# Patient Record
Sex: Male | Born: 1965 | Race: Asian | Hispanic: No | Marital: Single | Smoking: Light tobacco smoker
Health system: Southern US, Community
[De-identification: ages and names within clinical notes are randomized; demographics above are authoritative.]

## PROBLEM LIST (undated history)

## (undated) DIAGNOSIS — D649 Anemia, unspecified: Secondary | ICD-10-CM

## (undated) DIAGNOSIS — K6389 Other specified diseases of intestine: Secondary | ICD-10-CM

## (undated) DIAGNOSIS — F329 Major depressive disorder, single episode, unspecified: Secondary | ICD-10-CM

## (undated) DIAGNOSIS — F32A Depression, unspecified: Secondary | ICD-10-CM

---

## 2018-06-24 DIAGNOSIS — E8809 Other disorders of plasma-protein metabolism, not elsewhere classified: Secondary | ICD-10-CM

## 2018-06-24 DIAGNOSIS — D649 Anemia, unspecified: Secondary | ICD-10-CM

## 2018-06-24 DIAGNOSIS — R64 Cachexia: Secondary | ICD-10-CM

## 2018-06-25 DIAGNOSIS — D509 Iron deficiency anemia, unspecified: Secondary | ICD-10-CM

## 2018-07-06 ENCOUNTER — Other Ambulatory Visit: Payer: Self-pay

## 2018-07-06 ENCOUNTER — Inpatient Hospital Stay (HOSPITAL_COMMUNITY)
Admission: EM | Admit: 2018-07-06 | Discharge: 2018-07-08 | DRG: 374 | Disposition: A | Discharge status: Home / Self Care | Payer: Medicaid Other | Source: Other Acute Inpatient Hospital | Attending: Internal Medicine | Admitting: Internal Medicine

## 2018-07-06 ENCOUNTER — Encounter (HOSPITAL_COMMUNITY): Payer: Self-pay | Admitting: Internal Medicine

## 2018-07-06 ENCOUNTER — Inpatient Hospital Stay (HOSPITAL_COMMUNITY): Payer: Medicaid Other

## 2018-07-06 DIAGNOSIS — K631 Perforation of intestine (nontraumatic): Secondary | ICD-10-CM | POA: Diagnosis present

## 2018-07-06 DIAGNOSIS — D500 Iron deficiency anemia secondary to blood loss (chronic): Secondary | ICD-10-CM | POA: Diagnosis not present

## 2018-07-06 DIAGNOSIS — E43 Unspecified severe protein-calorie malnutrition: Secondary | ICD-10-CM | POA: Diagnosis present

## 2018-07-06 DIAGNOSIS — F17210 Nicotine dependence, cigarettes, uncomplicated: Secondary | ICD-10-CM | POA: Diagnosis present

## 2018-07-06 DIAGNOSIS — K6389 Other specified diseases of intestine: Secondary | ICD-10-CM

## 2018-07-06 DIAGNOSIS — D649 Anemia, unspecified: Secondary | ICD-10-CM

## 2018-07-06 DIAGNOSIS — E876 Hypokalemia: Secondary | ICD-10-CM | POA: Diagnosis present

## 2018-07-06 DIAGNOSIS — Z20828 Contact with and (suspected) exposure to other viral communicable diseases: Secondary | ICD-10-CM | POA: Diagnosis not present

## 2018-07-06 DIAGNOSIS — D539 Nutritional anemia, unspecified: Secondary | ICD-10-CM | POA: Diagnosis present

## 2018-07-06 DIAGNOSIS — I5022 Chronic systolic (congestive) heart failure: Secondary | ICD-10-CM | POA: Diagnosis present

## 2018-07-06 DIAGNOSIS — R64 Cachexia: Secondary | ICD-10-CM | POA: Diagnosis present

## 2018-07-06 DIAGNOSIS — E46 Unspecified protein-calorie malnutrition: Secondary | ICD-10-CM

## 2018-07-06 DIAGNOSIS — R1012 Left upper quadrant pain: Secondary | ICD-10-CM | POA: Diagnosis present

## 2018-07-06 DIAGNOSIS — Z638 Other specified problems related to primary support group: Secondary | ICD-10-CM

## 2018-07-06 DIAGNOSIS — Z658 Other specified problems related to psychosocial circumstances: Secondary | ICD-10-CM

## 2018-07-06 DIAGNOSIS — D72829 Elevated white blood cell count, unspecified: Secondary | ICD-10-CM | POA: Diagnosis present

## 2018-07-06 DIAGNOSIS — G8929 Other chronic pain: Secondary | ICD-10-CM | POA: Diagnosis not present

## 2018-07-06 DIAGNOSIS — D72825 Bandemia: Secondary | ICD-10-CM

## 2018-07-06 DIAGNOSIS — Z681 Body mass index (BMI) 19 or less, adult: Secondary | ICD-10-CM | POA: Diagnosis not present

## 2018-07-06 DIAGNOSIS — Z590 Homelessness: Secondary | ICD-10-CM

## 2018-07-06 DIAGNOSIS — F17200 Nicotine dependence, unspecified, uncomplicated: Secondary | ICD-10-CM | POA: Diagnosis present

## 2018-07-06 DIAGNOSIS — R600 Localized edema: Secondary | ICD-10-CM | POA: Diagnosis present

## 2018-07-06 DIAGNOSIS — R109 Unspecified abdominal pain: Secondary | ICD-10-CM | POA: Diagnosis present

## 2018-07-06 DIAGNOSIS — C182 Malignant neoplasm of ascending colon: Principal | ICD-10-CM | POA: Diagnosis present

## 2018-07-06 DIAGNOSIS — Z789 Other specified health status: Secondary | ICD-10-CM

## 2018-07-06 HISTORY — DX: Depression, unspecified: F32.A

## 2018-07-06 HISTORY — DX: Major depressive disorder, single episode, unspecified: F32.9

## 2018-07-06 LAB — BASIC METABOLIC PANEL
Anion gap: 5 (ref 5–15)
BUN: 15 mg/dL (ref 6–20)
CO2: 22 mmol/L (ref 22–32)
Calcium: 7.4 mg/dL — ABNORMAL LOW (ref 8.9–10.3)
Chloride: 107 mmol/L (ref 98–111)
Creatinine, Ser: 0.47 mg/dL — ABNORMAL LOW (ref 0.61–1.24)
GFR calc Af Amer: >60 mL/min (ref >60)
GFR calc non Af Amer: >60 mL/min (ref >60)
Glucose, Bld: 105 mg/dL — ABNORMAL HIGH (ref 70–99)
Potassium: 2.9 mmol/L — ABNORMAL LOW (ref 3.5–5.1)
Sodium: 134 mmol/L — ABNORMAL LOW (ref 135–145)

## 2018-07-06 LAB — CBC
HCT: 16.1 % — ABNORMAL LOW (ref 39.0–52.0)
HCT: 26.9 % — ABNORMAL LOW (ref 39.0–52.0)
Hemoglobin: 5.3 g/dL — CL (ref 13.0–17.0)
Hemoglobin: 9.1 g/dL — ABNORMAL LOW (ref 13.0–17.0)
MCH: 26.6 pg (ref 26.0–34.0)
MCH: 26.5 pg (ref 26.0–34.0)
MCHC: 32.9 g/dL (ref 30.0–36.0)
MCHC: 33.8 g/dL (ref 30.0–36.0)
MCV: 80.9 fL (ref 80.0–100.0)
MCV: 78.2 fL — ABNORMAL LOW (ref 80.0–100.0)
Platelets: 342 10*3/uL (ref 150–400)
Platelets: 317 10*3/uL (ref 150–400)
RBC: 1.99 MIL/uL — ABNORMAL LOW (ref 4.22–5.81)
RBC: 3.44 MIL/uL — ABNORMAL LOW (ref 4.22–5.81)
RDW: 22.3 % — ABNORMAL HIGH (ref 11.5–15.5)
RDW: 20.2 % — ABNORMAL HIGH (ref 11.5–15.5)
WBC: 16.1 10*3/uL — ABNORMAL HIGH (ref 4.0–10.5)
WBC: 18.1 10*3/uL — ABNORMAL HIGH (ref 4.0–10.5)
nRBC: 0 % (ref 0.0–0.2)
nRBC: 0 % (ref 0.0–0.2)

## 2018-07-06 LAB — SARS CORONAVIRUS 2 BY RT PCR (HOSPITAL ORDER, PERFORMED IN ~~LOC~~ HOSPITAL LAB): SARS Coronavirus 2: NEGATIVE

## 2018-07-06 LAB — HEPATIC FUNCTION PANEL
ALT: 12 U/L (ref 0–44)
AST: 11 U/L — ABNORMAL LOW (ref 15–41)
Albumin: 1.6 g/dL — ABNORMAL LOW (ref 3.5–5.0)
Alkaline Phosphatase: 60 U/L (ref 38–126)
Bilirubin, Direct: 0.5 mg/dL — ABNORMAL HIGH (ref 0.0–0.2)
Indirect Bilirubin: 1.2 mg/dL — ABNORMAL HIGH (ref 0.3–0.9)
Total Bilirubin: 1.7 mg/dL — ABNORMAL HIGH (ref 0.3–1.2)
Total Protein: 5.1 g/dL — ABNORMAL LOW (ref 6.5–8.1)

## 2018-07-06 LAB — ABO/RH: ABO/RH(D): B POS

## 2018-07-06 LAB — MAGNESIUM
Magnesium: 1.7 mg/dL (ref 1.7–2.4)
Magnesium: 2.1 mg/dL (ref 1.7–2.4)

## 2018-07-06 LAB — LIPASE, BLOOD: Lipase: 17 U/L (ref 11–51)

## 2018-07-06 LAB — PHOSPHORUS: Phosphorus: 2.7 mg/dL (ref 2.5–4.6)

## 2018-07-06 LAB — HIV ANTIBODY (ROUTINE TESTING W REFLEX): HIV Screen 4th Generation wRfx: NONREACTIVE

## 2018-07-06 LAB — HEMOGLOBIN AND HEMATOCRIT, BLOOD
HCT: 16.6 % — ABNORMAL LOW (ref 39.0–52.0)
Hemoglobin: 5.5 g/dL — CL (ref 13.0–17.0)

## 2018-07-06 LAB — PREALBUMIN: Prealbumin: 5.7 mg/dL — ABNORMAL LOW (ref 18–38)

## 2018-07-06 LAB — PREPARE RBC (CROSSMATCH)

## 2018-07-06 MED ORDER — DEXTROSE-NACL 5-0.45 % IV SOLN
INTRAVENOUS | Status: DC
  Administered 2018-07-06: 22:00:00 via INTRAVENOUS

## 2018-07-06 MED ORDER — BOOST / RESOURCE BREEZE PO LIQD CUSTOM
1.0000 | Freq: Three times a day (TID) | ORAL | Status: DC
  Administered 2018-07-06 – 2018-07-07 (×5): 1 via ORAL

## 2018-07-06 MED ORDER — POTASSIUM CHLORIDE CRYS ER 20 MEQ PO TBCR
40.0000 meq | EXTENDED_RELEASE_TABLET | Freq: Once | ORAL | Status: AC
  Administered 2018-07-06: 40 meq via ORAL
  Filled 2018-07-06: qty 2

## 2018-07-06 MED ORDER — IOHEXOL 300 MG/ML  SOLN
100.0000 mL | Freq: Once | INTRAMUSCULAR | Status: AC | PRN
  Administered 2018-07-06: 100 mL via INTRAVENOUS

## 2018-07-06 MED ORDER — MAGNESIUM SULFATE 2 GM/50ML IV SOLN
2.0000 g | Freq: Once | INTRAVENOUS | Status: AC
  Administered 2018-07-06: 2 g via INTRAVENOUS
  Filled 2018-07-06: qty 50

## 2018-07-06 MED ORDER — SODIUM CHLORIDE 0.9% IV SOLUTION
Freq: Once | INTRAVENOUS | Status: AC
  Administered 2018-07-06: 04:00:00 via INTRAVENOUS

## 2018-07-06 MED ORDER — ACETAMINOPHEN 650 MG RE SUPP: 650.0000 mg | Freq: Four times a day (QID) | RECTAL | Status: DC | PRN

## 2018-07-06 MED ORDER — ONDANSETRON HCL 4 MG/2ML IJ SOLN: 4.0000 mg | Freq: Four times a day (QID) | INTRAMUSCULAR | Status: DC | PRN

## 2018-07-06 MED ORDER — ADULT MULTIVITAMIN W/MINERALS CH
1.0000 | ORAL_TABLET | Freq: Every day | ORAL | Status: DC
  Administered 2018-07-06 – 2018-07-07 (×2): 1 via ORAL
  Filled 2018-07-06 (×2): qty 1

## 2018-07-06 MED ORDER — ONDANSETRON HCL 4 MG PO TABS: 4.0000 mg | ORAL_TABLET | Freq: Four times a day (QID) | ORAL | Status: DC | PRN

## 2018-07-06 MED ORDER — PIPERACILLIN-TAZOBACTAM 3.375 G IVPB
3.3750 g | Freq: Three times a day (TID) | INTRAVENOUS | Status: DC
  Administered 2018-07-06 – 2018-07-07 (×2): 3.375 g via INTRAVENOUS
  Filled 2018-07-06 (×2): qty 50

## 2018-07-06 MED ORDER — ACETAMINOPHEN 325 MG PO TABS
650.0000 mg | ORAL_TABLET | Freq: Four times a day (QID) | ORAL | Status: DC | PRN
  Administered 2018-07-07: 650 mg via ORAL
  Filled 2018-07-06: qty 2

## 2018-07-06 NOTE — Progress Notes (Signed)
Pt arrived to the unit by [facility], alert & oriented x 4, in no acute distress. Pt received 2 units of blood prior to arriving to the floor (HBG was 3.1, K+ was 3.1) in need of 2 more units. Pt had no skin issues to report. [place] is his primary language, an interpreter was used with pt . Pt has an IV in his right forearm an his left AC. Pt has not eaten in 2 days. Pt feet were swollen 3+ as well as his scrotum. Pt was oriented to the unit and his room, an  instructed to use the call light that was placed in his reach if he needs anything.

## 2018-07-06 NOTE — Progress Notes (Signed)
CRITICAL VALUE ALERT  Critical Value: hgb 5.3 Date & Time Notied: 07/06/2018 0813  Provider Notified: Dr. Cyndia Skeeters notified   Orders Received/Actions taken: pt is currently receiving blood and will receive one more unit.

## 2018-07-06 NOTE — Progress Notes (Signed)
CT abdomen and pelvis showed large fungating mass in the ascending colon suggestive for contained perforation and abscess and abscess formation.  No extravasation of contrast.  Started on Zosyn.  Discussed with general surgery, Dr. Donne Hazel.  We will keep n.p.o.  SCD for VTE prophylaxis.

## 2018-07-06 NOTE — Progress Notes (Signed)
Pt arrived to unit per [facility] transport. [facility] admissions paged and notified.

## 2018-07-06 NOTE — H&P (Signed)
History and Physical    Brandon Landry NGE:[id number] DOB: [birth year] DOA: 07/06/2018  PCP: Patient, No Pcp Per  Patient coming from: [place] transfer  I have personally briefly reviewed patient's old medical records in [place]  Chief Complaint: Generalized weakness  HPI: Brandon Landry is a 53 y.o. male with medical history significant of depression.  He presents to the ED at [facility] with c/o generalized weakness and hunger.  He apparently is on food stamps and relies on a Education officer, museum to come pick him up and bring him to food bank 5 times a week, though apparently Education officer, museum isnt scheduled to be there every day.  Last ate 2 days ago.  Patient was admitted to [facility] on 4/22 after being found to have HGB of 3, Wt loss over past 5 months.  Iron deficiency anemia, concern for possible GI source.  Was scheduled for EGD and colonoscopy by GI there, but left AMA after transfusions before this could be done.  Patient also seen by oncology who suspected that patient may just be anemic due to severe malnourishment.   ED Course: Today HGB is back down to 3.1.  Patient denies any melena, hematochezia, hematemesis.  States through the [place] translator "my stool is brown" and "I dont understand how stool can be black".   Review of Systems: As per HPI otherwise 10 point review of systems negative.   Past Medical History:  Diagnosis Date  . Depression     History reviewed. No pertinent surgical history.   reports that he has been smoking. He does not have any smokeless tobacco history on file. He reports that he does not drink alcohol or use drugs.  Allergies no known allergies  Family History  Problem Relation Age of Onset  . Hypertension Neg Hx   . Diabetes Neg Hx   . Stroke Neg Hx   . Cancer Neg Hx      Prior to Admission medications   Not on File    Physical Exam: Vitals:   07/06/18 0159  BP: 112/80  Pulse: 82  Resp: 18  Temp: (!) 97.5 F (36.4 C)  TempSrc: Oral  SpO2: 100%   Weight: 50.1 kg  Height: 5' 8.11" (1.73 m)    Constitutional: NAD, calm, comfortable, thin and malnourished Eyes: PERRL, lids and conjunctivae normal ENMT: Mucous membranes are moist. Posterior pharynx clear of any exudate or lesions.Normal dentition.  Neck: normal, supple, no masses, no thyromegaly Respiratory: clear to auscultation bilaterally, no wheezing, no crackles. Normal respiratory effort. No accessory muscle use.  Cardiovascular: Regular rate and rhythm, no murmurs / rubs / gallops. No extremity edema. 2+ pedal pulses. No carotid bruits.  Abdomen: no tenderness, no masses palpated. No hepatosplenomegaly. Bowel sounds positive.  Musculoskeletal: no clubbing / cyanosis. No joint deformity upper and lower extremities. Good ROM, no contractures. Normal muscle tone.  Skin: no rashes, lesions, ulcers. No induration Neurologic: CN 2-12 grossly intact. Sensation intact, DTR normal. Strength 5/5 in all 4.  Psychiatric: Normal judgment and insight. Alert and oriented x 3. Normal mood.    Labs on Admission: I have personally reviewed following labs and imaging studies  CBC: No results for input(s): WBC, NEUTROABS, HGB, HCT, MCV, PLT in the last 168 hours. Basic Metabolic Panel: No results for input(s): NA, K, CL, CO2, GLUCOSE, BUN, CREATININE, CALCIUM, MG, PHOS in the last 168 hours. GFR: CrCl cannot be calculated (No successful lab value found.). Liver Function Tests: No results for input(s): AST, ALT, ALKPHOS, BILITOT,  PROT, ALBUMIN in the last 168 hours. No results for input(s): LIPASE, AMYLASE in the last 168 hours. No results for input(s): AMMONIA in the last 168 hours. Coagulation Profile: No results for input(s): INR, PROTIME in the last 168 hours. Cardiac Enzymes: No results for input(s): CKTOTAL, CKMB, CKMBINDEX, TROPONINI in the last 168 hours. BNP (last 3 results) No results for input(s): PROBNP in the last 8760 hours. HbA1C: No results for input(s): HGBA1C in the last  72 hours. CBG: No results for input(s): GLUCAP in the last 168 hours. Lipid Profile: No results for input(s): CHOL, HDL, LDLCALC, TRIG, CHOLHDL, LDLDIRECT in the last 72 hours. Thyroid Function Tests: No results for input(s): TSH, T4TOTAL, FREET4, T3FREE, THYROIDAB in the last 72 hours. Anemia Panel: No results for input(s): VITAMINB12, FOLATE, FERRITIN, TIBC, IRON, RETICCTPCT in the last 72 hours. Urine analysis: No results found for: COLORURINE, APPEARANCEUR, LABSPEC, PHURINE, GLUCOSEU, HGBUR, BILIRUBINUR, KETONESUR, PROTEINUR, UROBILINOGEN, NITRITE, LEUKOCYTESUR  Radiological Exams on Admission: No results found.  EKG: Independently reviewed.  Assessment/Plan Principal Problem:   Symptomatic anemia Active Problems:   Iron deficiency anemia due to chronic blood loss   Malnutrition ([place])    1. Symptomatic anemia - 1. Microcytic, hypochromic anemia HGB 3.1, determined to have iron deficiency during admit ~2 weeks ago per [facility] EDP notes 2. DDx includes (but not limited to) GIB, and malnutrition 3. Doesn't sound like he is having acute, massive GIB based on HPI, and based on no reported bloody BMs nor hematemesis since being in ED (or at all per patient) 1. On the other hand, did drop HGB back to 3.1 despite transfusion of multiple PRBC units during admission 4/22.  Would have expected some more sustained response if malnutrition and iron deficiency alone were the culprit here. 4. Call GI in AM 5. Nutrition consult 6. SW consult 7. CM consult 8. Clear liquid diet only 9. Got 2u PRBC in ED transfused 10. Repeat H/H just came back and is 5.5 11. Ordering 2 more PRBC units for transfusion (assuming H/H still below 7) 12. Tele monitor 13. INR 1.3, APTT nl 14. Takes no medications to be causing GIB  DVT prophylaxis: SCDs Code Status: Full Family Communication: No family in room Disposition Plan: Home after admit Consults called: None, call GI in AM Admission status: Admit to  inpatient  Severity of Illness: The appropriate patient status for this patient is INPATIENT. Inpatient status is judged to be reasonable and necessary in order to provide the required intensity of service to ensure the patient's safety. The patient's presenting symptoms, physical exam findings, and initial radiographic and laboratory data in the context of their chronic comorbidities is felt to place them at high risk for further clinical deterioration. Furthermore, it is not anticipated that the patient will be medically stable for discharge from the hospital within 2 midnights of admission. The following factors support the patient status of inpatient.   Inpatient status for hemoglobin of 3.1!  Requiring blood transfusions, work up, probably EGD, colonoscopy too.   * I certify that at the point of admission it is my clinical judgment that the patient will require inpatient hospital care spanning beyond 2 midnights from the point of admission due to high intensity of service, high risk for further deterioration and high frequency of surveillance required.*    Petronella Shuford M. DO [facility]  How to contact the [facility] Attending or Consulting provider [place] or covering provider during after hours [place], for this patient?  1. Check the care team in [facility] and look for a) attending/consulting TR[facility] provider listed and b) the [facility] team listed 2. Log into [email or web address]  Amion Physician Scheduling and messaging for groups and whole hospitals  On call and physician scheduling software for group practices, residents, hospitalists and other medical providers for call, clinic, rotation and shift schedules. OnCall [employer] is a hospital-wide system for scheduling doctors and paging doctors on call. EasyPlot is for scientific plotting and data analysis.  [email or web address]  and use [place]'s universal password to access. If you do not have the password, please contact the hospital operator.  3. Locate  the [facility] provider you are looking for under [facility] and page to a number that you can be directly reached. 4. If you still have difficulty reaching the provider, please page the [facility] (Director on Call) for the Hospitalists listed on amion for assistance.  07/06/2018, 2:46 AM

## 2018-07-06 NOTE — Progress Notes (Signed)
Pharmacy Antibiotic Note  Brandon Landry is a 53 y.o. male admitted on 07/06/2018 with weakness.  Pharmacy has been consulted for Zosyn dosing for IAI. Patient is afebrile, WBC elevated at 18.1, and renal function is normal. CT abd shows large fungating mass suggesting contained perforation and abscess formation.  Plan: Zosyn 3.375g IV q8h (4 hour infusion).  F/U plan  Height: 5' 8.11" (173 cm) Weight: 110 lb 7.2 oz (50.1 kg) IBW/kg (Calculated) : 68.65  Temp (24hrs), Avg:97.8 F (36.6 C), Min:97.5 F (36.4 C), Max:98.3 F (36.8 C)  Recent Labs  Lab 07/06/18 0240 07/06/18 0351 07/06/18 1702  WBC  --  16.1* 18.1*  CREATININE 0.47*  --   --     Estimated Creatinine Clearance: 75.7 mL/min (A) (by C-G formula based on SCr of 0.47 mg/dL (L)).    No Known Allergies  Antimicrobials this admission: Zosyn 5/4 >>   Dose adjustments this admission:   Microbiology results: 5/4 COVID: NEG  Thank you for allowing pharmacy to be a part of this patient's care.  Renold Genta, PharmD, BCPS Clinical Pharmacist Clinical phone for 07/06/2018 until 10p is [phone] 07/06/2018 8:58 PM  **Pharmacist phone directory can now be found on [email or web address] listed under [place]**

## 2018-07-06 NOTE — Progress Notes (Signed)
PROGRESS NOTE  Brandon Landry OFB:[id number] DOB: [birth year] DOA: 07/06/2018 PCP: Patient, No Pcp Per   LOS: 0 days   Patient is from: Homeless  Brief Narrative / Interim history: 53 year old male with history of iron deficiency anemia, malnutrition and ?depression presented to [facility] ED for generalized weakness and hunger.  Patient had previous hospitalization at [facility] mid April where he presented with generalized weakness and found to be anemic with hemoglobin to 3.0.  MCV was 55.  Had leukocytosis to 16 and thrombocytosis to 641.  Also had low albumin. Iron studies showed iron level less than 10, TIBC 267 and ferritin level of 7.3.  He was transfused 3 units of blood, and hemoglobin improved to 7.7.  Echocardiogram with EF of 45 to 50%, mild global hypokinesis mild to moderate MR but no other significant structural functional abnormalities.  Evaluated by GI and hematology/oncology at [facility].  GI recommended endoscopy and colonoscopy.  Hematology felt his severe anemia was due to malnutrition complicated by his psychosocial issue as he has been estranged from his children away for 3 years.  At that time, patient reportedly refused lab works, endoscopy and colonoscopy and left AMA.  Returned to [facility] ED on 07/05/2018 with generalized weakness and hunger again.  Hemodynamically stable.  Significant work-up include WBC 17, hemoglobin 3.1, MCV 74, RDW 35, platelets 392, sodium 131, potassium 3.1, proBNP 1390, albumin 2.1, normal chest x-ray and troponin.   Patient was transferred to [facility] for further evaluation and management.  Patient denies melena or hematochezia.  Hemodynamically stable.  Hemoglobin 5.5 after 2 units.  Sodium 134, potassium 2.9.   Subjective: Patient complains about left upper quadrant pain.  Cannot characterize his pain.  Denies radiation.  States had pain for 1 day.  He says he had loose bowel movement yesterday.  He denies hematochezia or melena.  Denies  nausea or vomiting.  Reports chest pain for 6 months.  Denies dyspnea.  Feels very weak.  Denies urinary symptoms.  Reports significant weight loss over the last 1 hour.  Could not quantify.  Denies any past medical history.  Reports smoking about 10 cigarettes a day.  Denies alcohol recreational drug use. Denies taking any medication on a regular basis.   Assessment & Plan: Iron deficiency anemia: hemoglobin 3.1 on arrival to ED at [facility].  Recurrent issue.  Thought to be due to malnutrition.  -Received 2 units so far.  Hemoglobin trended up to 5.5. -Could have hemoglobinopathy but to late to test for this. -We will transfuse another 2 units -Monitor H&H -FOBT -We will consult GI  Hypokalemia: Likely due to malnutrition.  Magnesium 1.7.  Phosphorus within normal range. -Replenish and recheck potassium and magnesium  Abdominal pain: Unclear etiology of this.  Had some leukocytosis which could be concerning for infectious process.  Also had significant weight loss. -We will check lipase -We will get CT abdomen and pelvis  Chronic systolic CHF: Has no cardiopulmonary symptoms other than chronic chest pain for 6 months.  Echo on 06/25/2018 at [facility] showed EF of 45 to 50%, mild global hypokinesis mild to moderate MR but no other significant structural functional abnormalities.  Has 2+ pitting edema bilaterally which could be due to anemia and malnutrition as well.  proBNP was mildly elevated to 1390 at [place]. -We will monitor for respiratory status and fluid status  Severe protein calorie malnutrition/significant weight loss: Likely due to poor p.o. intake. Food stamp dependent and has no own transportation means.  There is  also some element of psychosocial issue -Consult nutrition -We will provide supplements  Psychosocial issue/homelessness: reportedly estranged from his children and wife for 3 years, and is homeless.  No means of transportation.  Has flat affect concerning for  depression. -Case social work consulted  Tobacco use disorder-reports smoking about 10 cigarettes a day. -Offer low-dose nicotine patch  Scheduled Meds: . feeding supplement  1 Container Oral TID BM   Continuous Infusions: PRN Meds:.acetaminophen **OR** acetaminophen, ondansetron **OR** ondansetron (ZOFRAN) IV   DVT prophylaxis: SCD Code Status: Full code Family Communication: None at bedside.  Reportedly estranged from his family Disposition Plan: Remains inpatient pending evaluation and treatment for severe iron deficiency anemia and malnutrition  Consultants:   None  Procedures:   Blood transfusion  Microbiology: . None  Antimicrobials: Anti-infectives (From admission, onward)   None       Objective: Vitals:   07/06/18 0540 07/06/18 0621 07/06/18 0910 07/06/18 1049  BP: 105/74 91/60 104/75 108/79  Pulse: 80 88 88 82  Resp: 16 16    Temp: 97.7 F (36.5 C) 98.3 F (36.8 C) 98.2 F (36.8 C) (!) 97.5 F (36.4 C)  TempSrc: Oral Oral Oral Oral  SpO2: 100% 100% 100%   Weight:      Height:        Intake/Output Summary (Last 24 hours) at 07/06/2018 1050 Last data filed at 07/06/2018 0909 Gross per 24 hour  Intake 315 ml  Output -  Net 315 ml   Filed Weights   07/06/18 0159  Weight: 50.1 kg    Examination:  GENERAL: Chronically ill-appearing. HEENT: MMM.  Vision and hearing grossly intact.  Sclera anicteric NECK: Supple.  No JVD or lymphadenopathy LUNGS:  No IWOB. Good air movement bilaterally. HEART:  RRR. Heart sounds normal.  ABD: Bowel sounds present. Soft.  Mild tenderness over LUQ MSK/EXT:  Moves all extremities.  Significant muscle mass wasting.  2+ pitting edema in both lower extremities SKIN: no apparent skin lesion or wound NEURO: Awake, alert and oriented appropriately.  No gross deficit.  PSYCH: Flat affect   Data Reviewed: I have independently reviewed following labs and imaging studies  CBC: Recent Labs  Lab 07/06/18 0240 07/06/18  0351  WBC  --  16.1*  HGB 5.5* 5.3*  HCT 16.6* 16.1*  MCV  --  80.9  PLT  --  734   Basic Metabolic Panel: Recent Labs  Lab 07/06/18 0237 07/06/18 0240  NA  --  134*  K  --  2.9*  CL  --  107  CO2  --  22  GLUCOSE  --  105*  BUN  --  15  CREATININE  --  0.47*  CALCIUM  --  7.4*  MG 1.7  --   PHOS 2.7  --    GFR: Estimated Creatinine Clearance: 75.7 mL/min (A) (by C-G formula based on SCr of 0.47 mg/dL (L)). Liver Function Tests: No results for input(s): AST, ALT, ALKPHOS, BILITOT, PROT, ALBUMIN in the last 168 hours. No results for input(s): LIPASE, AMYLASE in the last 168 hours. No results for input(s): AMMONIA in the last 168 hours. Coagulation Profile: No results for input(s): INR, PROTIME in the last 168 hours. Cardiac Enzymes: No results for input(s): CKTOTAL, CKMB, CKMBINDEX, TROPONINI in the last 168 hours. BNP (last 3 results) No results for input(s): PROBNP in the last 8760 hours. HbA1C: No results for input(s): HGBA1C in the last 72 hours. CBG: No results for input(s): GLUCAP in the last 168  hours. Lipid Profile: No results for input(s): CHOL, HDL, LDLCALC, TRIG, CHOLHDL, LDLDIRECT in the last 72 hours. Thyroid Function Tests: No results for input(s): TSH, T4TOTAL, FREET4, T3FREE, THYROIDAB in the last 72 hours. Anemia Panel: No results for input(s): VITAMINB12, FOLATE, FERRITIN, TIBC, IRON, RETICCTPCT in the last 72 hours. Urine analysis: No results found for: COLORURINE, APPEARANCEUR, LABSPEC, PHURINE, GLUCOSEU, HGBUR, BILIRUBINUR, KETONESUR, PROTEINUR, UROBILINOGEN, NITRITE, LEUKOCYTESUR Sepsis Labs: Invalid input(s): PROCALCITONIN, LACTICIDVEN  Recent Results (from the past 240 hour(s))  SARS Coronavirus 2 (CEPHEID - Performed in [place] hospital lab), Hosp Order     Status: None   Collection Time: 07/06/18  6:16 AM  Result Value Ref Range Status   SARS Coronavirus 2 NEGATIVE NEGATIVE Final    Comment: (NOTE) If result is NEGATIVE SARS-CoV-2  target nucleic acids are NOT DETECTED. The SARS-CoV-2 RNA is generally detectable in upper and lower  respiratory specimens during the acute phase of infection. The lowest  concentration of SARS-CoV-2 viral copies this assay can detect is 250  copies / mL. A negative result does not preclude SARS-CoV-2 infection  and should not be used as the sole basis for treatment or other  patient management decisions.  A negative result may occur with  improper specimen collection / handling, submission of specimen other  than nasopharyngeal swab, presence of viral mutation(s) within the  areas targeted by this assay, and inadequate number of viral copies  (<250 copies / mL). A negative result must be combined with clinical  observations, patient history, and epidemiological information. If result is POSITIVE SARS-CoV-2 target nucleic acids are DETECTED. The SARS-CoV-2 RNA is generally detectable in upper and lower  respiratory specimens dur ing the acute phase of infection.  Positive  results are indicative of active infection with SARS-CoV-2.  Clinical  correlation with patient history and other diagnostic information is  necessary to determine patient infection status.  Positive results do  not rule out bacterial infection or co-infection with other viruses. If result is PRESUMPTIVE POSTIVE SARS-CoV-2 nucleic acids MAY BE PRESENT.   A presumptive positive result was obtained on the submitted specimen  and confirmed on repeat testing.  While 2019 novel coronavirus  (SARS-CoV-2) nucleic acids may be present in the submitted sample  additional confirmatory testing may be necessary for epidemiological  and / or clinical management purposes  to differentiate between  SARS-CoV-2 and other Sarbecovirus currently known to infect humans.  If clinically indicated additional testing with an alternate test  methodology [phone]) is advised. The SARS-CoV-2 RNA is generally  detectable in upper and lower  respiratory sp ecimens during the acute  phase of infection. The expected result is Negative. Fact Sheet for Patients:  StrictlyIdeas.no Fact Sheet for Healthcare Providers: BankingDealers.co.za This test is not yet approved or cleared by the [place] FDA and has been authorized for detection and/or diagnosis of SARS-CoV-2 by FDA under an Emergency Use Authorization (EUA).  This EUA will remain in effect (meaning this test can be used) for the duration of the COVID-19 declaration under Section 564(b)(1) of the Act, 21 U.S.C. section 360bbb-3(b)(1), unless the authorization is terminated or revoked sooner. Performed at [facility], [place] [address] [place], [place] [address]       Radiology Studies: No results found.  35 minutes with more than 50% spent in reviewing records from outside hospital and here, counseling patient and coordinating care.  Bryer Cozzolino T. [facility] Pager [phone]  If 7PM-7AM, please contact night-coverage [email or web address] Password [email or web address]  07/06/2018, 10:50 AM

## 2018-07-06 NOTE — Consult Note (Signed)
Reason for Consult:mass on ct  Referring Physician: Dr Mina Marble is an 53 y.o. male.  HPI: Brandon Landry. This is done via [place] interpreter. He did not participate at times.  He apparently is homeless or lives alone. Doesn't eat much due to inability to get food. He has appetite.  He was admitted to [facility] in April with hb of 3.  He left ama after transfusions. He returned for weakness and diarrhea. He has black stools. No emesis, no nausea. No abd pain he describes. No history csc.  He had ct scan today and was found to have likely large locally invasive colon cancer  Past Medical History:  Diagnosis Date  . Depression     History reviewed. No pertinent surgical history.  Family History  Problem Relation Age of Onset  . Hypertension Neg Hx   . Diabetes Neg Hx   . Stroke Neg Hx   . Cancer Neg Hx     Social History:  reports that he has been smoking. He does not have any smokeless tobacco history on file. He reports that he does not drink alcohol or use drugs.  Allergies: No Known Allergies  Medications: I have reviewed the patient's current medications.  Results for orders placed or performed during the hospital encounter of 07/06/18 (from the past 48 hour(s))  Phosphorus     Status: None   Collection Time: 07/06/18  2:37 AM  Result Value Ref Range   Phosphorus 2.7 2.5 - 4.6 mg/dL    Comment: Performed at [facility], [place] [address], [place], [place] [address]  Magnesium     Status: None   Collection Time: 07/06/18  2:37 AM  Result Value Ref Range   Magnesium 1.7 1.7 - 2.4 mg/dL    Comment: Performed at [place] [address] [place], [place] [address]  Basic metabolic panel     Status: Abnormal   Collection Time: 07/06/18  2:40 AM  Result Value Ref Range   Sodium 134 (L) 135 - 145 mmol/L   Potassium 2.9 (L) 3.5 - 5.1 mmol/L   Chloride 107 98 - 111 mmol/L   CO2 22 22 - 32 mmol/L   Glucose, Bld 105 (H) 70 - 99 mg/dL   BUN 15 6 - 20  mg/dL   Creatinine, Ser 0.47 (L) 0.61 - 1.24 mg/dL   Calcium 7.4 (L) 8.9 - 10.3 mg/dL   GFR calc non Af Amer >60 >60 mL/min   GFR calc Af Amer >60 >60 mL/min   Anion gap 5 5 - 15    Comment: Performed at [facility], [place] [address], [place], [place] [address]  Type and screen [place]     Status: None (Preliminary result)   Collection Time: 07/06/18  2:40 AM  Result Value Ref Range   ABO/RH(D) B POS    Antibody Screen NEG    Sample Expiration 07/09/2018    Unit Number [id number]    Blood Component Type RED CELLS,LR    Unit division 00    Status of Unit REL FROM [facility]    Transfusion Status OK TO TRANSFUSE    Crossmatch Result NOT NEEDED    Unit Number [id number]    Blood Component Type RED CELLS,LR    Unit division 00    Status of Unit ISSUED    Transfusion Status OK TO TRANSFUSE    Crossmatch Result Compatible    Unit Number [id number]  Blood Component Type RED CELLS,LR    Unit division 00    Status of Unit ISSUED    Transfusion Status OK TO TRANSFUSE    Crossmatch Result      Compatible Performed at [facility], [place] [address] [place], [place] [address]   Hemoglobin and Hematocrit     Status: Abnormal   Collection Time: 07/06/18  2:40 AM  Result Value Ref Range   Hemoglobin 5.5 (LL) 13.0 - 17.0 g/dL    Comment: REPEATED TO VERIFY THIS CRITICAL RESULT HAS VERIFIED AND BEEN CALLED TO RN CHAMASTY MICHELLE BY MESSAN HOUEGNIFIO ON 05 04 2020 AT 0309, AND HAS BEEN READ BACK.     HCT 16.6 (L) 39.0 - 52.0 %    Comment: Performed at [facility], [place] [address], [place], [place] [address]  ABO/Rh     Status: None   Collection Time: 07/06/18  2:40 AM  Result Value Ref Range   ABO/RH(D)      B POS Performed at [facility] [address] [place], [place] [address]   Prepare RBC     Status: None   Collection Time: 07/06/18  3:50 AM  Result Value Ref Range   Order Confirmation      ORDER PROCESSED BY BLOOD  BANK Performed at [facility], [place] [address] [place], [place] [address]   HIV antibody (Routine Testing)     Status: None   Collection Time: 07/06/18  3:50 AM  Result Value Ref Range   HIV Screen 4th Generation wRfx Non Reactive Non Reactive    Comment: (NOTE) Performed At: [facility] [place], [place] [id number] Rush Farmer MD PT:[id number]   CBC     Status: Abnormal   Collection Time: 07/06/18  3:51 AM  Result Value Ref Range   WBC 16.1 (H) 4.0 - 10.5 K/uL   RBC 1.99 (L) 4.22 - 5.81 MIL/uL   Hemoglobin 5.3 (LL) 13.0 - 17.0 g/dL    Comment: REPEATED TO VERIFY THIS CRITICAL RESULT HAS VERIFIED AND BEEN CALLED TO MCCAULEY,E RN BY SHANNON FLEMING ON 05 04 2020 AT 0811, AND HAS BEEN READ BACK.     HCT 16.1 (L) 39.0 - 52.0 %   MCV 80.9 80.0 - 100.0 fL   MCH 26.6 26.0 - 34.0 pg   MCHC 32.9 30.0 - 36.0 g/dL   RDW 22.3 (H) 11.5 - 15.5 %   Platelets 342 150 - 400 K/uL   nRBC 0.0 0.0 - 0.2 %    Comment: Performed at [facility] [address] [place], [place] [address]  SARS Coronavirus 2 (CEPHEID - Performed in [facility] lab), Hosp Order     Status: None   Collection Time: 07/06/18  6:16 AM  Result Value Ref Range   SARS Coronavirus 2 NEGATIVE NEGATIVE    Comment: (NOTE) If result is NEGATIVE SARS-CoV-2 target nucleic acids are NOT DETECTED. The SARS-CoV-2 RNA is generally detectable in upper and lower  respiratory specimens during the acute phase of infection. The lowest  concentration of SARS-CoV-2 viral copies this assay can detect is 250  copies / mL. A negative result does not preclude SARS-CoV-2 infection  and should not be used as the sole basis for treatment or other  patient management decisions.  A negative result may occur with  improper specimen collection / handling, submission of specimen other  than nasopharyngeal swab, presence of viral mutation(s) within the  areas targeted by this assay, and inadequate  number of viral copies  (<250 copies / mL). A negative result must be combined with clinical  observations, patient history, and epidemiological information. If result is POSITIVE SARS-CoV-2 target nucleic acids are DETECTED. The SARS-CoV-2 RNA is generally detectable in upper and lower  respiratory specimens dur ing the acute phase of infection.  Positive  results are indicative of active infection with SARS-CoV-2.  Clinical  correlation with patient history and other diagnostic information is  necessary to determine patient infection status.  Positive results do  not rule out bacterial infection or co-infection with other viruses. If result is PRESUMPTIVE POSTIVE SARS-CoV-2 nucleic acids MAY BE PRESENT.   A presumptive positive result was obtained on the submitted specimen  and confirmed on repeat testing.  While 2019 novel coronavirus  (SARS-CoV-2) nucleic acids may be present in the submitted sample  additional confirmatory testing may be necessary for epidemiological  and / or clinical management purposes  to differentiate between  SARS-CoV-2 and other Sarbecovirus currently known to infect humans.  If clinically indicated additional testing with an alternate test  methodology [id number]) is advised. The SARS-CoV-2 RNA is generally  detectable in upper and lower respiratory sp ecimens during the acute  phase of infection. The expected result is Negative. Fact Sheet for Patients:  StrictlyIdeas.no Fact Sheet for Healthcare Providers: BankingDealers.co.za This test is not yet approved or cleared by the [place] FDA and has been authorized for detection and/or diagnosis of SARS-CoV-2 by FDA under an Emergency Use Authorization (EUA).  This EUA will remain in effect (meaning this test can be used) for the duration of the COVID-19 declaration under Section 564(b)(1) of the Act, 21 U.S.C. section 360bbb-3(b)(1), unless the  authorization is terminated or revoked sooner. Performed at [facility], [place] [address] [place], [place] [address]   Magnesium     Status: None   Collection Time: 07/06/18  4:15 PM  Result Value Ref Range   Magnesium 2.1 1.7 - 2.4 mg/dL    Comment: Performed at [facility], [place] [address] [place], [place] [address]  Prealbumin     Status: Abnormal   Collection Time: 07/06/18  4:15 PM  Result Value Ref Range   Prealbumin 5.7 (L) 18 - 38 mg/dL    Comment: Performed at [facility] [address] [place], [place] [address]  Lipase, blood     Status: None   Collection Time: 07/06/18  4:15 PM  Result Value Ref Range   Lipase 17 11 - 51 U/L    Comment: Performed at [facility] [address] [place], [place] [address]  Hepatic function panel     Status: Abnormal   Collection Time: 07/06/18  4:15 PM  Result Value Ref Range   Total Protein 5.1 (L) 6.5 - 8.1 g/dL   Albumin 1.6 (L) 3.5 - 5.0 g/dL   AST 11 (L) 15 - 41 U/L   ALT 12 0 - 44 U/L   Alkaline Phosphatase 60 38 - 126 U/L   Total Bilirubin 1.7 (H) 0.3 - 1.2 mg/dL   Bilirubin, Direct 0.5 (H) 0.0 - 0.2 mg/dL   Indirect Bilirubin 1.2 (H) 0.3 - 0.9 mg/dL    Comment: Performed at [facility] [address], [place], [place] [address]  CBC     Status: Abnormal   Collection Time: 07/06/18  5:02 PM  Result Value Ref Range   WBC 18.1 (H)  4.0 - 10.5 K/uL   RBC 3.44 (L) 4.22 - 5.81 MIL/uL   Hemoglobin 9.1 (L) 13.0 - 17.0 g/dL    Comment: REPEATED TO VERIFY POST TRANSFUSION SPECIMEN    HCT 26.9 (L) 39.0 - 52.0 %   MCV 78.2 (L) 80.0 - 100.0 fL   MCH 26.5 26.0 - 34.0 pg   MCHC 33.8 30.0 - 36.0 g/dL   RDW 20.2 (H) 11.5 - 15.5 %   Platelets 317 150 - 400 K/uL   nRBC 0.0 0.0 - 0.2 %    Comment: Performed at [facility], [place] [address] [place], [place] [address]    Ct Abdomen Pelvis W Contrast  Result Date: 07/06/2018 CLINICAL DATA:  Left lower quadrant pain EXAM: CT ABDOMEN AND PELVIS  WITH CONTRAST TECHNIQUE: Multidetector CT imaging of the abdomen and pelvis was performed using the standard protocol following bolus administration of intravenous contrast. CONTRAST:  136mL OMNIPAQUE IOHEXOL 300 MG/ML  SOLN COMPARISON:  None. FINDINGS: Lower chest: Small bilateral pleural effusions are noted right greater than left with mild bibasilar atelectatic changes. Hepatobiliary: Liver is well visualized and within normal limits. The gallbladder is well distended without cholelithiasis. Pancreas: Unremarkable. No pancreatic ductal dilatation or surrounding inflammatory changes. Spleen: Normal in size without focal abnormality. Adrenals/Urinary Tract: Adrenal glands are within normal limits. The kidneys demonstrate a normal enhancement pattern and normal excretion bilaterally. A small cortical cyst is noted within the right kidney measuring approximately 1 cm. The bladder is well distended. Stomach/Bowel: The distal colon appears within normal limits. In the ascending colon there is a large colonic mass with changes highly suggestive of contained perforation. This measures approximately 9.9 x 9.9 cm in greatest AP and transverse dimensions. This extends along the margin of the second portion of the duodenum and duodenal wall thickening is noted suggestive of localized invasion. Mottled air fluid collection is noted adjacent to the column contrast which likely represents contained perforation. Free fluid is noted within the pelvis consistent with the perforation. The appendix is within normal limits. The jejunum and ileum are within normal limits with the exception of some wall thickening in the distal ileum related to the localized inflammatory change from the colonic mass. Vascular/Lymphatic: Aortic atherosclerosis. No enlarged abdominal or pelvic lymph nodes. Reproductive: Prostate is unremarkable. Other: Free fluid is noted within the pelvis as previously described. This also extends higher in the abdomen  along the spleen and liver consistent with the findings in the ascending colon consistent with contained perforation. Musculoskeletal: No acute bony abnormality is seen. IMPRESSION: Large fungating mass in the ascending colon with changes highly suggestive of contained perforation and adjacent abscess formation. This extends along the margin of the second portion of the duodenum in the possibility of localized invasion of the mass deserves consideration. Surgical consultation is recommended. Free fluid within the abdomen consistent with the known perforated mass. No definitive extravasation of contrast material is noted at this time consistent with a more contained perforation. Bilateral pleural effusions with mild bibasilar atelectasis. Electronically Signed   By: Inez Catalina M.D.   On: 07/06/2018 17:55    Review of Systems  Constitutional: Positive for weight loss.  Gastrointestinal: Positive for diarrhea.  Neurological: Positive for weakness.  Psychiatric/Behavioral: Positive for depression.   Blood pressure 120/80, pulse 76, temperature 97.6 F (36.4 C), temperature source Oral, resp. rate 18, height 5' 8.11" (1.73 m), weight 50.1 kg, SpO2 100 %. Physical Exam  Constitutional: He is oriented to person, place, and time.  Vital signs are normal. He appears cachectic.  HENT:  Head: Normocephalic and atraumatic.  Right Ear: External ear normal.  Left Ear: External ear normal.  Mouth/Throat: Abnormal dentition.  Eyes: Pupils are equal, round, and reactive to light. No scleral icterus.  Neck: Neck supple.  Cardiovascular: Normal rate and regular rhythm.  Respiratory: Effort normal and breath sounds normal.  GI: Bowel sounds are normal. He exhibits distension and mass (right sided mass large easilly palpable). There is no abdominal tenderness. No hernia.  Lymphadenopathy:    He has no cervical adenopathy.  Neurological: He is alert and oriented to person, place, and time.  Skin: Skin is warm  and dry.    Assessment/Plan: Likely right colon cancer -recommend cea, staging -he likely will need surgery at some point but his ntn is poor, possible contained perforation makes systemic therapy not likely to begin with as well -maximize nutrition -He told me he will decide when time is right to look into this.  He asked we come back and discuss again. He does not need urgent surgery at all. This has been longstanding problem for him.  Will see him tomorrow again  Rolm Bookbinder 07/06/2018, 9:50 PM

## 2018-07-06 NOTE — Progress Notes (Signed)
Initial Nutrition Assessment  RD working remotely.  DOCUMENTATION CODES:   Underweight  INTERVENTION:   -Continue Boost Breeze po TID, each supplement provides 250 kcal and 9 grams of protein -MVI with minerals daily -Once diet advances, pt is at high risk for refeeding syndrome. Recommend monitor Mg, K, and Phos daily x 3 days once diet is initiated and replete as necessary  NUTRITION DIAGNOSIS:   Predicted suboptimal nutrient intake related to social / environmental circumstances as evidenced by per patient/family report.  GOAL:   Patient will meet greater than or equal to 90% of their needs  MONITOR:   PO intake, Supplement acceptance, Diet advancement, Labs, Weight trends, Skin, I & O's  REASON FOR ASSESSMENT:   Consult Assessment of nutrition requirement/status  ASSESSMENT:   Brandon Landry is a 53 y.o. male with medical history significant of depression.  He presents to the ED at [facility] with c/o generalized weakness and hunger.  He apparently is on food stamps and relies on a Education officer, museum to come pick him up and bring him to food bank 5 times a week, though apparently Education officer, museum isnt scheduled to be there every day.  Last ate 2 days ago.  Pt admitted with symptomatic anemia.  Per H&P, hematology is suspects that anemia is related to pt's malnutrition. Pt awaiting GI consult.   Reviewed I/O's: +630 ml x 24 hours  Per MD notes, pt with food insecurity. He is on food stamps and relies on a Education officer, museum to provide him with food daily, however, social work is not scheduled to come to his home daily. Per H&P, pt did not eat from 3 days PTA.   No wt records available to assess wt changes at this time. Suspect pt with malnutrition, however, unable to identify at this time without completion of nutrition-focused physical exam.   Pt is currently on a clear liquid diet with Boost Breeze supplements. Will continue Boost Breeze supplements to increase calorie and protein intake, as  pt will be unable to meet nutritional needs on clear liquid diet alone. Will follow for diet advancement and adjust supplement orders as appropriate.   Labs reviewed.   NUTRITION - FOCUSED PHYSICAL EXAM:    Most Recent Value  Orbital Region  Unable to assess  Upper Arm Region  Unable to assess  Thoracic and Lumbar Region  Unable to assess  Buccal Region  Unable to assess  Temple Region  Unable to assess  Clavicle Bone Region  Unable to assess  Clavicle and Acromion Bone Region  Unable to assess  Scapular Bone Region  Unable to assess  Dorsal Hand  Unable to assess  Patellar Region  Unable to assess  Anterior Thigh Region  Unable to assess  Posterior Calf Region  Unable to assess  Edema (RD Assessment)  Unable to assess  Hair  Unable to assess  Eyes  Unable to assess  Mouth  Unable to assess  Skin  Unable to assess  Nails  Unable to assess       Diet Order:   Diet Order            Diet clear liquid Room service appropriate? Yes; Fluid consistency: Thin  Diet effective now              EDUCATION NEEDS:   No education needs have been identified at this time  Skin:  Skin Assessment: Reviewed RN Assessment  Last BM:  07/06/18  Height:   Ht Readings from Last 1  Encounters:  07/06/18 5' 8.11" (1.73 m)    Weight:   Wt Readings from Last 1 Encounters:  07/06/18 50.1 kg    Ideal Body Weight:  70 kg  BMI:  Body mass index is 16.74 kg/m.  Estimated Nutritional Needs:   Kcal:  1800-2000  Protein:  100-115 grams  Fluid:  > 1.8 L    Pearle Wandler A. Jimmye [place], RD, LDN, [place] Registered Dietitian II Certified Diabetes Care and Education Specialist Pager: [phone] After hours Pager: [phone]

## 2018-07-07 DIAGNOSIS — K6389 Other specified diseases of intestine: Secondary | ICD-10-CM

## 2018-07-07 LAB — BPAM RBC
Blood Product Expiration Date: 202005092359
Blood Product Expiration Date: 202005162359
Blood Product Expiration Date: 202005162359
ISSUE DATE / TIME: 202005040603
ISSUE DATE / TIME: 202005041000
ISSUE DATE / TIME: 202005041056
Unit Type and Rh: 5100
Unit Type and Rh: 7300
Unit Type and Rh: 7300

## 2018-07-07 LAB — BASIC METABOLIC PANEL
Anion gap: 9 (ref 5–15)
BUN: 12 mg/dL (ref 6–20)
CO2: 19 mmol/L — ABNORMAL LOW (ref 22–32)
Calcium: 7.4 mg/dL — ABNORMAL LOW (ref 8.9–10.3)
Chloride: 103 mmol/L (ref 98–111)
Creatinine, Ser: 0.46 mg/dL — ABNORMAL LOW (ref 0.61–1.24)
GFR calc Af Amer: >60 mL/min (ref >60)
GFR calc non Af Amer: >60 mL/min (ref >60)
Glucose, Bld: 89 mg/dL (ref 70–99)
Potassium: 3.7 mmol/L (ref 3.5–5.1)
Sodium: 131 mmol/L — ABNORMAL LOW (ref 135–145)

## 2018-07-07 LAB — TYPE AND SCREEN
ABO/RH(D): B POS
Antibody Screen: NEGATIVE
Unit division: 0
Unit division: 0
Unit division: 0

## 2018-07-07 LAB — CBC
HCT: 24.3 % — ABNORMAL LOW (ref 39.0–52.0)
Hemoglobin: 8.3 g/dL — ABNORMAL LOW (ref 13.0–17.0)
MCH: 26.7 pg (ref 26.0–34.0)
MCHC: 34.2 g/dL (ref 30.0–36.0)
MCV: 78.1 fL — ABNORMAL LOW (ref 80.0–100.0)
Platelets: 348 10*3/uL (ref 150–400)
RBC: 3.11 MIL/uL — ABNORMAL LOW (ref 4.22–5.81)
RDW: 20.5 % — ABNORMAL HIGH (ref 11.5–15.5)
WBC: 17.6 10*3/uL — ABNORMAL HIGH (ref 4.0–10.5)
nRBC: 0 % (ref 0.0–0.2)

## 2018-07-07 LAB — PHOSPHORUS: Phosphorus: 2.7 mg/dL (ref 2.5–4.6)

## 2018-07-07 LAB — MAGNESIUM: Magnesium: 2 mg/dL (ref 1.7–2.4)

## 2018-07-07 MED ORDER — AMOXICILLIN-POT CLAVULANATE 875-125 MG PO TABS
1.0000 | ORAL_TABLET | Freq: Two times a day (BID) | ORAL | Status: DC
  Administered 2018-07-07: 1 via ORAL
  Filled 2018-07-07 (×4): qty 1

## 2018-07-07 NOTE — Evaluation (Signed)
Occupational Therapy Evaluation Patient Details Name: Brandon Landry MRN: [id number] DOB: 1965-12-16 Today's Date: 07/07/2018    History of Present Illness 53 yo male admitted to [facility] on 5/4 from [place] with symptomatic anemia, LE swelling, LUQ pain. PMH includes depression, CHF, malnutrition, and homelessness.   Clinical Impression   Pt admitted for above, limited by problem list below.  PTA patient reports living alone, needing assist for ADls/mobility but "not having any assistance" and endorses multiple falls in the last 3 months.  Patient presents with generalized weakness, impaired balance and decreased activity tolerance, as well as poor safety awareness and poor awareness to deficits/role of therapy.  Patient able to complete basic transfers with mod assist +2 using RW, in room functional mobility with min guard +2 for safety using RW, UB ADls with min assist and LB ADLs with min-mod assist.  Utilized interpreter (via [place]) and attempted to educate on recommendation of SNF, role of rehabilitation (different than exercise); pt with poor understanding of recommendations.  Patient will benefit from continued OT services while admitted and after dc at SNF level in order to optimize independence and safety with ADLs/mobility prior to dc home.     Follow Up Recommendations  SNF;Supervision/Assistance - 24 hour    Equipment Recommendations  3 in 1 bedside commode    Recommendations for Other Services       Precautions / Restrictions Precautions Precautions: Fall Restrictions Weight Bearing Restrictions: No      Mobility Bed Mobility Overal bed mobility: Needs Assistance Bed Mobility: Supine to Sit     Supine to sit: HOB elevated;Min guard     General bed mobility comments: Min guard for safety, pt with increased time and effort to perform and use of bedrails.  Transfers Overall transfer level: Needs assistance Equipment used: Rolling walker (2 wheeled) Transfers: Sit to/from  Stand Sit to Stand: Mod assist;+2 safety/equipment;From elevated surface         General transfer comment: Mod assist for power up, steadying pt.    Balance Overall balance assessment: Needs assistance;History of Falls Sitting-balance support: No upper extremity supported;Feet supported Sitting balance-Leahy Scale: Good     Standing balance support: Bilateral upper extremity supported Standing balance-Leahy Scale: Poor Standing balance comment: reliant on RW for stability, supporting self with hips at sink during grooming tasks                            ADL either performed or assessed with clinical judgement   ADL Overall ADL's : Needs assistance/impaired     Grooming: Min guard;Standing   Upper Body Bathing: Set up;Sitting   Lower Body Bathing: Minimal assistance;Sit to/from stand   Upper Body Dressing : Set up;Sitting   Lower Body Dressing: Minimal assistance;Sit to/from stand   Toilet Transfer: Moderate assistance;+2 for physical assistance;+2 for safety/equipment;Ambulation;RW Toilet Transfer Details (indicate cue type and reason): simulated to recliner  Toileting- Clothing Manipulation and Hygiene: Minimal assistance;Sit to/from stand       Functional mobility during ADLs: +2 for safety/equipment;Min guard;Rolling walker General ADL Comments: pt limited by generalized weakness, decreased activity tolerance and poor balance     Vision   Additional Comments: appears [facility]      Perception     Praxis      Pertinent Vitals/Pain Pain Assessment: No/denies pain     Hand Dominance Right   Extremity/Trunk Assessment Upper Extremity Assessment Upper Extremity Assessment: Generalized weakness   Lower Extremity Assessment Lower Extremity  Assessment: Defer to PT evaluation RLE Deficits / Details: MMT: hip flexion 2/5, knee extension 2/5, knee flexion 1/5 LLE Deficits / Details: MMT: hip flexion 2/5, knee extension 2/5, knee flexion 1/5   Cervical  / Trunk Assessment Cervical / Trunk Assessment: Normal   Communication Communication Communication: Prefers language other than English(Korean )   Cognition Arousal/Alertness: Awake/alert Behavior During Therapy: WFL for tasks assessed/performed Overall Cognitive Status: Impaired/Different from baseline Area of Impairment: Safety/judgement;Awareness;Problem solving                         Safety/Judgement: Decreased awareness of safety;Decreased awareness of deficits Awareness: Emergent Problem Solving: Requires verbal cues General Comments: Pt with lack of awareness of health deficits, stating he had to leave to go home today to get [employer] orders and cancel his auto insurance. Pt with difficulty understanding the purpose of going to a rehab facility post-acutely stating "you cannot exercise for two hours a day, you have to work your way up to that. You must have low IQs". In the next statement, pt reporting he cannot go home because he can't walk.   General Comments  interpreter [phone] [facility]    Exercises     Shoulder Instructions      Home Living Family/patient expects to be discharged to:: Private residence Living Arrangements: Alone   Type of Home: Apartment Home Access: Stairs to enter Entrance Stairs-Number of Steps: 15 Entrance Stairs-Rails: Can reach both Home Layout: One level     Bathroom Shower/Tub: Teacher, early years/pre: Standard     Home Equipment: None          Prior Functioning/Environment Level of Independence: Needs assistance  Gait / Transfers Assistance Needed: pt states "I couldn't walk" ADL's / Homemaking Assistance Needed: Pt reports not doing ADLs PTA due to not being able to, secondary to weakness/fatigue   Comments: reports needing assist but not having it, reporting "I couldnt walk, I didn't do anything"         OT Problem List: Decreased strength;Decreased activity tolerance;Impaired balance (sitting and/or  standing);Decreased cognition;Decreased safety awareness;Decreased knowledge of use of DME or AE;Decreased knowledge of precautions      OT Treatment/Interventions: Self-care/ADL training;Therapeutic exercise;DME and/or AE instruction;Therapeutic activities;Patient/family education;Balance training    OT Goals(Current goals can be found in the care plan section) Acute Rehab OT Goals Patient Stated Goal: get stronger OT Goal Formulation: With patient Time For Goal Achievement: 07/21/18 Potential to Achieve Goals: Good  OT Frequency: Min 2X/week   Barriers to D/C:            Co-evaluation              AM-PAC OT "6 Clicks" Daily Activity     Outcome Measure Help from another person eating meals?: A Little Help from another person taking care of personal grooming?: A Little Help from another person toileting, which includes using toliet, bedpan, or urinal?: A Little Help from another person bathing (including washing, rinsing, drying)?: A Little Help from another person to put on and taking off regular upper body clothing?: A Little Help from another person to put on and taking off regular lower body clothing?: A Little 6 Click Score: 18   End of Session Equipment Utilized During Treatment: Gait belt;Rolling walker Nurse Communication: Mobility status  Activity Tolerance: Patient tolerated treatment well Patient left: in chair;with call bell/phone within reach;with chair alarm set  OT Visit Diagnosis: Other abnormalities of gait and mobility (  R26.89);Muscle weakness (generalized) (M62.81)                Time: 0814-4818 OT Time Calculation (min): 33 min Charges:  OT General Charges $OT Visit: 1 Visit OT Evaluation $OT Eval Moderate Complexity: [place], OT Acute Rehabilitation Services Pager [phone] Office [phone]    Delight Stare 07/07/2018, 2:13 PM

## 2018-07-07 NOTE — TOC Initial Note (Signed)
Transition of Care [facility]) - Initial/Assessment Note    Patient Details  Name: Brandon Landry MRN: [id number] Date of Birth: 1966/01/01  Transition of Care [facility]) CM/SW Contact:    Benard Halsted, LCSW Phone Number: 07/07/2018, 3:21 PM  Clinical Narrative:                 53 year old male with history of iron deficiency anemia, malnutrition and depression presented to [facility] ED for generalized weakness and hunger.  CSW received consult for possible SNF placement at time of discharge. CSW spoke with patient using [place], regarding PT recommendation of SNF placement at time of discharge. Patient reported that he will be discharging home. When CSW discussed a SNF, patient stated that he is only 73 and does not need rehab. He reports he does not have anyone at home to help but he will be ok. CSW inquired about his food source and he states he calls a free service. He also reports that he has a car for transportation. He is open to home health services and a walker/bedside commode. CSW confirmed his address is correct on the Facesheet. Patient expressed being hopeful for rehab and to feel better soon. No further questions reported at this time. CSW sent referral to [facility] for [place] and they are reviewing. CSW also provided [facility] resources (food and other resources) for patient to follow up with at discharge. CSW called and confirmed that patient does not receive services with [place]. CSW to continue to follow and assist with discharge planning needs.   Expected Discharge Plan: [place] Barriers to Discharge: Inadequate or no insurance   Patient Goals and CMS Choice Patient states their goals for this hospitalization and ongoing recovery are:: Return home CMS Medicare.gov Compare Post Acute Care list provided to:: Patient Choice offered to / list presented to : Patient  Expected Discharge Plan and Services Expected Discharge  Plan: [place] In-house Referral: Clinical Social Work, Counselling psychologist, Risk manager Acute Care Choice: Museum/gallery conservator, Home Health Living arrangements for the past 2 months: Apartment                 DME Arranged: 3-N-1, [place]ford Rile DME Agency: [employer] Date DME Agency Contacted: 07/07/18 Time DME Agency Contacted: [phone] Representative spoke with at DME Agency: Left message for Zack HH Arranged: RN, PT, Nurse's Aide [place] Agency: [facility] Date [facility] Agency Contacted: 07/07/18 Time [place]: 1520 Representative spoke with at [place]: Dorian Pod  Prior Living Arrangements/Services Living arrangements for the past 2 months: Apartment Lives with:: Self Patient language and need for interpreter reviewed:: Yes(Speaks [place]) Do you feel safe going back to the place where you live?: Yes      Need for Family Participation in Patient Care: No (Comment) Care giver support system in place?: No (comment)   Criminal Activity/Legal Involvement Pertinent to Current Situation/Hospitalization: No - Comment as needed  Activities of Daily Living   ADL Screening (condition at time of admission) Patient's cognitive ability adequate to safely complete daily activities?: Yes Is the patient deaf or have difficulty hearing?: No Does the patient have difficulty seeing, even when wearing glasses/contacts?: No Does the patient have difficulty concentrating, remembering, or making decisions?: No Patient able to express need for assistance with ADLs?: Yes Does the patient have difficulty dressing or bathing?: Yes Independently performs ADLs?: No Communication: Independent with device (comment)(interpreter) Dressing (OT): Needs assistance Is  this a change from baseline?: Change from baseline, expected to last <3days Grooming: Needs assistance Is this a change from baseline?: Change from baseline, expected to last <3 days Feeding:  Independent Bathing: Needs assistance Is this a change from baseline?: Change from baseline, expected to last <3 days Toileting: Needs assistance Is this a change from baseline?: Change from baseline, expected to last <3 days In/Out Bed: Needs assistance Is this a change from baseline?: Change from baseline, expected to last <3 days Walks in Home: Needs assistance Is this a change from baseline?: Change from baseline, expected to last <3 days Does the patient have difficulty walking or climbing stairs?: Yes Weakness of Legs: Both Weakness of Arms/Hands: None  Permission Sought/Granted Permission sought to share information with : Chartered certified accountant granted to share information with : Yes, Verbal Permission Granted     Permission granted to share info w AGENCY: Home Health        Emotional Assessment Appearance:: Appears stated age Attitude/Demeanor/Rapport: Guarded Affect (typically observed): Appropriate, Guarded Orientation: : Oriented to Self, Oriented to Place, Oriented to  Time, Oriented to Situation Alcohol / Substance Use: Not Applicable Psych Involvement: No (comment)  Admission diagnosis:  GI BLEED ANEMIA Patient Active Problem List   Diagnosis Date Noted  . Iron deficiency anemia due to chronic blood loss 07/06/2018  . Symptomatic anemia 07/06/2018  . Malnutrition ([place]) 07/06/2018  . Chronic systolic CHF (congestive heart failure) ([place]) 07/06/2018  . Lower extremity edema 07/06/2018  . Leukocytosis 07/06/2018  . Abdominal pain 07/06/2018  . Psychosocial stressors 07/06/2018  . Tobacco use disorder 07/06/2018  . Hypokalemia 07/06/2018  . Hypomagnesemia 07/06/2018   PCP:  Patient, No Pcp Per Pharmacy:   [facility] [place] [place] [address] [place] [address] Phone: [phone] Fax: [phone]     Social Determinants of Health ([place]) Interventions    Readmission  Risk Interventions Readmission Risk Prevention Plan 07/07/2018  Transportation Screening Complete

## 2018-07-07 NOTE — Evaluation (Signed)
Physical Therapy Evaluation Patient Details Name: Brandon Landry MRN: [id number] DOB: [birth year] Today's Date: 07/07/2018   History of Present Illness  53 yo male admitted to [facility] on 5/4 from [place] with symptomatic anemia, LE swelling, LUQ pain. PMH includes depression, CHF, malnutrition, and homelessness.  Clinical Impression   Pt presents with LE weakness, difficulty performing mobility tasks, impaired standing balance, and decreased activity tolerance. Pt to benefit from acute PT to address deficits. Pt ambulated in-room distance with min guard and verbal cuing for posture and placement in RW. Pt required mod assist +2 for transfer. PT recommending SNF placement given pt's mobility status and lack of social support. Pt appears to have mixed feelings about rehab post-acutely, as pt states he does not want to do 2 hours of exercise a day because he cannot walk well, but he also does not want to go home because he cannot walk well. PT and OT attempted to explain rehabilitation vs exercise, pt does not seem to understand the explanation. PT to progress mobility as tolerated, and will continue to follow acutely.      Follow Up Recommendations SNF;Supervision/Assistance - 24 hour    Equipment Recommendations  Other (comment)(defer to next venue)    Recommendations for Other Services       Precautions / Restrictions Precautions Precautions: Fall Restrictions Weight Bearing Restrictions: No      Mobility  Bed Mobility Overal bed mobility: Needs Assistance Bed Mobility: Supine to Sit     Supine to sit: HOB elevated;Min guard     General bed mobility comments: Min guard for safety, pt with increased time and effort to perform and use of bedrails.  Transfers Overall transfer level: Needs assistance Equipment used: Rolling walker (2 wheeled) Transfers: Sit to/from Stand Sit to Stand: Mod assist;+2 safety/equipment;From elevated surface         General transfer comment: Mod assist  for power up, steadying pt. Pt with prolonged period of moving from hip flexion to hip extension.   Ambulation/Gait Ambulation/Gait assistance: Min guard;+2 safety/equipment Gait Distance (Feet): 20 Feet Assistive device: Rolling walker (2 wheeled) Gait Pattern/deviations: Step-through pattern;Decreased stride length;Trunk flexed Gait velocity: decr   General Gait Details: Min guard for safety. Verbal cuing for placement in RW, upright posture in standing.  Stairs            Wheelchair Mobility    Modified Rankin (Stroke Patients Only)       Balance Overall balance assessment: Needs assistance;History of Falls(Pt reports falling over a 3 month period, anytime he was walking on unlevel surfaces without UE support) Sitting-balance support: No upper extremity supported;Feet supported Sitting balance-Leahy Scale: Good     Standing balance support: Bilateral upper extremity supported Standing balance-Leahy Scale: Poor Standing balance comment: reliant on RW for stability                             Pertinent Vitals/Pain Pain Assessment: No/denies pain    Home Living Family/patient expects to be discharged to:: Private residence Living Arrangements: Alone   Type of Home: Apartment Home Access: Stairs to enter Entrance Stairs-Rails: Can reach both Entrance Stairs-Number of Steps: 15 Home Layout: One level Home Equipment: None      Prior Function Level of Independence: Needs assistance   Gait / Transfers Assistance Needed: pt states "I couldn't walk"  ADL's / Homemaking Assistance Needed: Pt reports not doing ADLs PTA due to not being able to, secondary to weakness/fatigue  Comments: reports needing assist but not having it, reporting "I couldnt walk, I didn't do anything"      Hand Dominance   Dominant Hand: Right    Extremity/Trunk Assessment   Upper Extremity Assessment Upper Extremity Assessment: Defer to OT evaluation    Lower Extremity  Assessment Lower Extremity Assessment: LLE deficits/detail;RLE deficits/detail RLE Deficits / Details: MMT: hip flexion 2/5, knee extension 2/5, knee flexion 1/5 LLE Deficits / Details: MMT: hip flexion 2/5, knee extension 2/5, knee flexion 1/5    Cervical / Trunk Assessment Cervical / Trunk Assessment: Normal  Communication   Communication: Prefers language other than English([place] video interpreter utilized during session)  Cognition Arousal/Alertness: Awake/alert Behavior During Therapy: WFL for tasks assessed/performed Overall Cognitive Status: No family/caregiver present to determine baseline cognitive functioning                                 General Comments: Pt with lack of awareness of health deficits, stating he had to leave to go home today to get [place] orders and cancel his auto insurance. Pt with difficulty understanding the purpose of going to a rehab facility post-acutely stating "you cannot exercise for two hours a day, you have to work your way up to that. You must have low IQs". In the next statement, pt reporting he cannot go home because he can't walk.      General Comments General comments (skin integrity, edema, etc.): interpreter [phone] [facility]    Exercises     Assessment/Plan    PT Assessment Patient needs continued PT services  PT Problem List Decreased strength;Decreased mobility;Decreased safety awareness;Decreased activity tolerance;Decreased balance;Decreased knowledge of use of DME       PT Treatment Interventions DME instruction;Functional mobility training;Balance training;Patient/family education;Gait training;Therapeutic activities;Neuromuscular re-education;Therapeutic exercise    PT Goals (Current goals can be found in the Care Plan section)  Acute Rehab PT Goals Patient Stated Goal: get stronger PT Goal Formulation: With patient Time For Goal Achievement: 07/21/18 Potential to Achieve Goals: Good    Frequency Min 3X/week    Barriers to discharge Decreased caregiver support      Co-evaluation               AM-PAC PT "6 Clicks" Mobility  Outcome Measure Help needed turning from your back to your side while in a flat bed without using bedrails?: A Little Help needed moving from lying on your back to sitting on the side of a flat bed without using bedrails?: A Lot Help needed moving to and from a bed to a chair (including a wheelchair)?: A Lot Help needed standing up from a chair using your arms (e.g., wheelchair or bedside chair)?: A Lot Help needed to walk in hospital room?: A Little Help needed climbing 3-5 steps with a railing? : Total 6 Click Score: 13    End of Session Equipment Utilized During Treatment: Gait belt Activity Tolerance: Patient tolerated treatment well;Patient limited by fatigue Patient left: in chair;with chair alarm set;with call bell/phone within reach Nurse Communication: Mobility status PT Visit Diagnosis: Other abnormalities of gait and mobility (R26.89);Muscle weakness (generalized) (M62.81);History of falling (Z91.81)    Time: 0347-4259 PT Time Calculation (min) (ACUTE ONLY): 34 min   Charges:   PT Evaluation $PT Eval Low Complexity: 1 Low        Shericka Johnstone Conception Chancy, PT Acute Rehabilitation Services Pager [phone]  Office [phone]  Kemiyah Tarazon D Angelle Isais 07/07/2018, 1:20 PM

## 2018-07-07 NOTE — Progress Notes (Signed)
Pt sitting up in bed, eating. Took multivitamin.

## 2018-07-07 NOTE — Progress Notes (Signed)
PROGRESS NOTE  Brandon Landry PJA:[id number] DOB: 02-28-1966 DOA: 07/06/2018 PCP: Patient, No Pcp Per   LOS: 1 day   Patient is from: Home  Brief Narrative / Interim history: 53 year old male with history of iron deficiency anemia, malnutrition and ?depression presented to [facility] ED for generalized weakness and hunger.  Patient had previous hospitalization at [facility] mid April where he presented with generalized weakness and found to be anemic with hemoglobin to 3.0.  MCV was 55.  Had leukocytosis to 16 and thrombocytosis to 641.  Also had low albumin. Iron studies showed iron level less than 10, TIBC 267 and ferritin level of 7.3.  He was transfused 3 units of blood, and hemoglobin improved to 7.7.  Echocardiogram with EF of 45 to 50%, mild global hypokinesis mild to moderate MR but no other significant structural functional abnormalities.  Evaluated by GI and hematology/oncology at [facility].  GI recommended endoscopy and colonoscopy.  Hematology felt his severe anemia was due to malnutrition complicated by his psychosocial issue as he has been estranged from his children away for 3 years.  At that time, patient reportedly refused lab works, endoscopy and colonoscopy and left AMA.  Returned to [place] ED on 07/05/2018 with generalized weakness and hunger again.  Hemodynamically stable.  Significant work-up include WBC 17, hemoglobin 3.1, MCV 74, RDW 35, platelets 392, sodium 131, potassium 3.1, proBNP 1390, albumin 2.1, normal chest x-ray and troponin.   Patient was transferred to [facility] for further evaluation and management.  Patient denies melena or hematochezia.  Hemodynamically stable.  Received 4 units with appropriate response.  H&H stable.  Patient had abdominal tenderness on exam.  CT abdomen and pelvis obtained on 5/4 and showed 9.9 x 9.9 cm fungating mass in the ascending colon.  Per radiology, concern for contained perforation and adjacent abscess formation.  General surgery  consulted thinks the finding is Colon Cancer.  Per general surgery, patient is undecided about surgical intervention at this time.    Subjective: History obtained using [place] video interpreter with ID number [id number].  He says, "I am dying.  I need food."  He denies abdominal pain today.  He says he would like to go home and discuss with his family about the next plan.  He is also concerned on how to get back home since he was transported to the hospital by the ambulance.  Denies chest pain or dyspnea.   Assessment & Plan: Possible colon cancer versus contained perforation on CT abdomen and pelvis -Appreciate general surgery input. -Check CEA. -Reportedly unclear if he desires surgery at this point -Patient would like to go home but lives alone and PT/OT recommended SNF. -General surgery to arrange outpatient follow-up if patient decides to go home -We will change Zosyn to Augmentin to cover for possible infectious process. -Start diet  Iron deficiency anemia: hemoglobin 3.1 on arrival to ED at [facility].  Recurrent issue.  Initially thought to be due to malnutrition.  Now with possible malignancy. -Appropriate response after 4 units -Could have hemoglobinopathy but to late to test for this. -We will transfuse another 2 units -Monitor H&H -FOBT  Chronic systolic CHF: Has no cardiopulmonary symptoms other than chronic chest pain for 6 months.  Echo on 06/25/2018 at [facility] showed EF of 45 to 50%, mild global hypokinesis mild to moderate MR but no other significant structural functional abnormalities.  Has 2+ pitting edema bilaterally which could be due to anemia and possible malignancy and malnutrition as well.  proBNP was mildly  elevated to 1390 at [place]. -Discontinue IV fluid. -We will monitor for respiratory status and fluid status  Severe protein calorie malnutrition/significant weight loss: Likely due to poor p.o. intake. Food stamp dependent and has no own transportation means.   There is also some element of psychosocial issue -Consult nutrition-appreciate input -Monitor for refeeding syndrome. -Replenish electrolytes  Psychosocial issue: reportedly estranged from his children and wife for 3 years.  No means of transportation.  -PT/OT recommended SNF placement. -Case social work consulted  Tobacco use disorder-reports smoking about 10 cigarettes a day. -Offer low-dose nicotine patch  Scheduled Meds:  amoxicillin-clavulanate  1 tablet Oral Q12H   feeding supplement  1 Container Oral TID BM   multivitamin with minerals  1 tablet Oral Daily   Continuous Infusions:  dextrose 5 % and 0.45% NaCl 50 mL/hr at 07/07/18 0400   PRN Meds:.acetaminophen **OR** acetaminophen, ondansetron **OR** ondansetron (ZOFRAN) IV   DVT prophylaxis: SCD Code Status: Full code Family Communication: None at bedside.  Reportedly estranged from his family Disposition Plan: Remains inpatient.  Final disposition SNF if he does not want to pursue surgery while inpatient.  Consultants:   None  Procedures:   Blood transfusion  Microbiology:  None  Antimicrobials: Anti-infectives (From admission, onward)   Start     Dose/Rate Route Frequency Ordered Stop   07/07/18 1300  amoxicillin-clavulanate (AUGMENTIN) 875-125 MG per tablet 1 tablet     1 tablet Oral Every 12 hours 07/07/18 1255 07/14/18 0959   07/06/18 2200  piperacillin-tazobactam (ZOSYN) IVPB 3.375 g  Status:  Discontinued     3.375 g 12.5 mL/hr over 240 Minutes Intravenous Every 8 hours 07/06/18 2058 07/07/18 1255      Objective: Vitals:   07/06/18 1227 07/06/18 1444 07/06/18 2025 07/07/18 0417  BP: 119/81 112/79 120/80 118/82  Pulse: 85 84 76 77  Resp:  18    Temp: 97.6 F (36.4 C) 97.6 F (36.4 C) 97.6 F (36.4 C) 98 F (36.7 C)  TempSrc:  Oral Oral Oral  SpO2: 100%  100% 100%  Weight:      Height:        Intake/Output Summary (Last 24 hours) at 07/07/2018 1514 Last data filed at 07/07/2018  0400 Gross per 24 hour  Intake 265.31 ml  Output 500 ml  Net -234.69 ml   Filed Weights   07/06/18 0159  Weight: 50.1 kg    Examination:  GENERAL: Chronically ill-appearing. HEENT: MMM.  Vision and hearing grossly intact.  Sclera anicteric NECK: Supple.  No apparent JVD or lymphadenopathy. LUNGS:  No IWOB. Good air movement bilaterally. HEART:  RRR. Heart sounds normal.  ABD: Bowel sounds present. Soft. Non tender.  MSK/EXT:  Moves all extremities.  Significant muscle mass wasting.  2+ pitting edema bilaterally. SKIN: no apparent skin lesion or wound NEURO: Awake, alert and oriented appropriately.  No gross deficit.  PSYCH: Somewhat flat affect at the beginning of the encounter  Data Reviewed: I have independently reviewed following labs and imaging studies  CBC: Recent Labs  Lab 07/06/18 0240 07/06/18 0351 07/06/18 1702 07/07/18 0404  WBC  --  16.1* 18.1* 17.6*  HGB 5.5* 5.3* 9.1* 8.3*  HCT 16.6* 16.1* 26.9* 24.3*  MCV  --  80.9 78.2* 78.1*  PLT  --  342 317 774   Basic Metabolic Panel: Recent Labs  Lab 07/06/18 0237 07/06/18 0240 07/06/18 1615 07/07/18 0404  NA  --  134*  --  131*  K  --  2.9*  --  3.7  CL  --  107  --  103  CO2  --  22  --  19*  GLUCOSE  --  105*  --  89  BUN  --  15  --  12  CREATININE  --  0.47*  --  0.46*  CALCIUM  --  7.4*  --  7.4*  MG 1.7  --  2.1  --   PHOS 2.7  --   --   --    GFR: Estimated Creatinine Clearance: 75.7 mL/min (A) (by C-G formula based on SCr of 0.46 mg/dL (L)). Liver Function Tests: Recent Labs  Lab 07/06/18 1615  AST 11*  ALT 12  ALKPHOS 60  BILITOT 1.7*  PROT 5.1*  ALBUMIN 1.6*   Recent Labs  Lab 07/06/18 1615  LIPASE 17   No results for input(s): AMMONIA in the last 168 hours. Coagulation Profile: No results for input(s): INR, PROTIME in the last 168 hours. Cardiac Enzymes: No results for input(s): CKTOTAL, CKMB, CKMBINDEX, TROPONINI in the last 168 hours. BNP (last 3 results) No results  for input(s): PROBNP in the last 8760 hours. HbA1C: No results for input(s): HGBA1C in the last 72 hours. CBG: No results for input(s): GLUCAP in the last 168 hours. Lipid Profile: No results for input(s): CHOL, HDL, LDLCALC, TRIG, CHOLHDL, LDLDIRECT in the last 72 hours. Thyroid Function Tests: No results for input(s): TSH, T4TOTAL, FREET4, T3FREE, THYROIDAB in the last 72 hours. Anemia Panel: No results for input(s): VITAMINB12, FOLATE, FERRITIN, TIBC, IRON, RETICCTPCT in the last 72 hours. Urine analysis: No results found for: COLORURINE, APPEARANCEUR, LABSPEC, PHURINE, GLUCOSEU, HGBUR, BILIRUBINUR, KETONESUR, PROTEINUR, UROBILINOGEN, NITRITE, LEUKOCYTESUR Sepsis Labs: Invalid input(s): PROCALCITONIN, LACTICIDVEN  Recent Results (from the past 240 hour(s))  SARS Coronavirus 2 (CEPHEID - Performed in [facility] hospital lab), Hosp Order     Status: None   Collection Time: 07/06/18  6:16 AM  Result Value Ref Range Status   SARS Coronavirus 2 NEGATIVE NEGATIVE Final    Comment: (NOTE) If result is NEGATIVE SARS-CoV-2 target nucleic acids are NOT DETECTED. The SARS-CoV-2 RNA is generally detectable in upper and lower  respiratory specimens during the acute phase of infection. The lowest  concentration of SARS-CoV-2 viral copies this assay can detect is 250  copies / mL. A negative result does not preclude SARS-CoV-2 infection  and should not be used as the sole basis for treatment or other  patient management decisions.  A negative result may occur with  improper specimen collection / handling, submission of specimen other  than nasopharyngeal swab, presence of viral mutation(s) within the  areas targeted by this assay, and inadequate number of viral copies  (<250 copies / mL). A negative result must be combined with clinical  observations, patient history, and epidemiological information. If result is POSITIVE SARS-CoV-2 target nucleic acids are DETECTED. The SARS-CoV-2 RNA is  generally detectable in upper and lower  respiratory specimens dur ing the acute phase of infection.  Positive  results are indicative of active infection with SARS-CoV-2.  Clinical  correlation with patient history and other diagnostic information is  necessary to determine patient infection status.  Positive results do  not rule out bacterial infection or co-infection with other viruses. If result is PRESUMPTIVE POSTIVE SARS-CoV-2 nucleic acids MAY BE PRESENT.   A presumptive positive result was obtained on the submitted specimen  and confirmed on repeat testing.  While 2019 novel coronavirus  (SARS-CoV-2) nucleic acids may be present in the submitted  sample  additional confirmatory testing may be necessary for epidemiological  and / or clinical management purposes  to differentiate between  SARS-CoV-2 and other Sarbecovirus currently known to infect humans.  If clinically indicated additional testing with an alternate test  methodology [phone]) is advised. The SARS-CoV-2 RNA is generally  detectable in upper and lower respiratory sp ecimens during the acute  phase of infection. The expected result is Negative. Fact Sheet for Patients:  StrictlyIdeas.no Fact Sheet for Healthcare Providers: BankingDealers.co.za This test is not yet approved or cleared by the [place] FDA and has been authorized for detection and/or diagnosis of SARS-CoV-2 by FDA under an Emergency Use Authorization (EUA).  This EUA will remain in effect (meaning this test can be used) for the duration of the COVID-19 declaration under Section 564(b)(1) of the Act, 21 U.S.C. section 360bbb-3(b)(1), unless the authorization is terminated or revoked sooner. Performed at [facility], [place] [address] [place], [place] [address]       Radiology Studies: Ct Abdomen Pelvis W Contrast  Result Date: 07/06/2018 CLINICAL DATA:  Left lower quadrant pain EXAM: CT  ABDOMEN AND PELVIS WITH CONTRAST TECHNIQUE: Multidetector CT imaging of the abdomen and pelvis was performed using the standard protocol following bolus administration of intravenous contrast. CONTRAST:  187mL OMNIPAQUE IOHEXOL 300 MG/ML  SOLN COMPARISON:  None. FINDINGS: Lower chest: Small bilateral pleural effusions are noted right greater than left with mild bibasilar atelectatic changes. Hepatobiliary: Liver is well visualized and within normal limits. The gallbladder is well distended without cholelithiasis. Pancreas: Unremarkable. No pancreatic ductal dilatation or surrounding inflammatory changes. Spleen: Normal in size without focal abnormality. Adrenals/Urinary Tract: Adrenal glands are within normal limits. The kidneys demonstrate a normal enhancement pattern and normal excretion bilaterally. A small cortical cyst is noted within the right kidney measuring approximately 1 cm. The bladder is well distended. Stomach/Bowel: The distal colon appears within normal limits. In the ascending colon there is a large colonic mass with changes highly suggestive of contained perforation. This measures approximately 9.9 x 9.9 cm in greatest AP and transverse dimensions. This extends along the margin of the second portion of the duodenum and duodenal wall thickening is noted suggestive of localized invasion. Mottled air fluid collection is noted adjacent to the column contrast which likely represents contained perforation. Free fluid is noted within the pelvis consistent with the perforation. The appendix is within normal limits. The jejunum and ileum are within normal limits with the exception of some wall thickening in the distal ileum related to the localized inflammatory change from the colonic mass. Vascular/Lymphatic: Aortic atherosclerosis. No enlarged abdominal or pelvic lymph nodes. Reproductive: Prostate is unremarkable. Other: Free fluid is noted within the pelvis as previously described. This also extends  higher in the abdomen along the spleen and liver consistent with the findings in the ascending colon consistent with contained perforation. Musculoskeletal: No acute bony abnormality is seen. IMPRESSION: Large fungating mass in the ascending colon with changes highly suggestive of contained perforation and adjacent abscess formation. This extends along the margin of the second portion of the duodenum in the possibility of localized invasion of the mass deserves consideration. Surgical consultation is recommended. Free fluid within the abdomen consistent with the known perforated mass. No definitive extravasation of contrast material is noted at this time consistent with a more contained perforation. Bilateral pleural effusions with mild bibasilar atelectasis. Electronically Signed   By: Inez Catalina M.D.   On: 07/06/2018 17:55    35 minutes with  more than 50% spent in reviewing records from outside hospital and here, counseling patient and coordinating care.  Abiageal Blowe T. [facility] Pager [phone]  If 7PM-7AM, please contact night-coverage [email or web address] Password [email or web address] 07/07/2018, 3:14 PM

## 2018-07-07 NOTE — Progress Notes (Signed)
Subjective/Chief Complaint: Patient seen with the help of [place] interpretation service on iPad  Reviewed his situation.  He has a large neglected right colon mass with secondary anemia.  He was seen at [facility] late April and left AMA.  He presented to [facility] with similar symptoms.  He was admitted overnight.  He is unclear if he desires surgical resection.  He does complain his right upper quadrant mass.  He is malnourished and is hungry today.  He has no family in the night states and has some friends and [place].  He has no social support and may be homeless.   Objective: Vital signs in last 24 hours: Temp:  [97.5 F (36.4 C)-98 F (36.7 C)] 98 F (36.7 C) (05/05 0417) Pulse Rate:  [76-87] 77 (05/05 0417) Resp:  [16-18] 18 (05/04 1444) BP: (108-120)/(77-82) 118/82 (05/05 0417) SpO2:  [100 %] 100 % (05/05 0417) Last BM Date: 07/06/18  Intake/Output from previous day: 05/04 0701 - 05/05 0700 In: 895.3 [I.V.:231; Blood:630; IV Piggyback:34.3] Out: 500 [Urine:500] Intake/Output this shift: No intake/output data recorded.  GI: Right upper quadrant mass noted.  Mild tenderness.  No distention or peritonitis.  Lab Results:  Recent Labs    07/06/18 1702 07/07/18 0404  WBC 18.1* 17.6*  HGB 9.1* 8.3*  HCT 26.9* 24.3*  PLT 317 348   BMET Recent Labs    07/06/18 0240 07/07/18 0404  NA 134* 131*  K 2.9* 3.7  CL 107 103  CO2 22 19*  GLUCOSE 105* 89  BUN 15 12  CREATININE 0.47* 0.46*  CALCIUM 7.4* 7.4*   PT/INR No results for input(s): LABPROT, INR in the last 72 hours. ABG No results for input(s): PHART, HCO3 in the last 72 hours.  Invalid input(s): PCO2, PO2  Studies/Results: Ct Abdomen Pelvis W Contrast  Result Date: 07/06/2018 CLINICAL DATA:  Left lower quadrant pain EXAM: CT ABDOMEN AND PELVIS WITH CONTRAST TECHNIQUE: Multidetector CT imaging of the abdomen and pelvis was performed using the standard protocol following bolus administration  of intravenous contrast. CONTRAST:  176mL OMNIPAQUE IOHEXOL 300 MG/ML  SOLN COMPARISON:  None. FINDINGS: Lower chest: Small bilateral pleural effusions are noted right greater than left with mild bibasilar atelectatic changes. Hepatobiliary: Liver is well visualized and within normal limits. The gallbladder is well distended without cholelithiasis. Pancreas: Unremarkable. No pancreatic ductal dilatation or surrounding inflammatory changes. Spleen: Normal in size without focal abnormality. Adrenals/Urinary Tract: Adrenal glands are within normal limits. The kidneys demonstrate a normal enhancement pattern and normal excretion bilaterally. A small cortical cyst is noted within the right kidney measuring approximately 1 cm. The bladder is well distended. Stomach/Bowel: The distal colon appears within normal limits. In the ascending colon there is a large colonic mass with changes highly suggestive of contained perforation. This measures approximately 9.9 x 9.9 cm in greatest AP and transverse dimensions. This extends along the margin of the second portion of the duodenum and duodenal wall thickening is noted suggestive of localized invasion. Mottled air fluid collection is noted adjacent to the column contrast which likely represents contained perforation. Free fluid is noted within the pelvis consistent with the perforation. The appendix is within normal limits. The jejunum and ileum are within normal limits with the exception of some wall thickening in the distal ileum related to the localized inflammatory change from the colonic mass. Vascular/Lymphatic: Aortic atherosclerosis. No enlarged abdominal or pelvic lymph nodes. Reproductive: Prostate is unremarkable. Other: Free fluid is noted within the pelvis as previously  described. This also extends higher in the abdomen along the spleen and liver consistent with the findings in the ascending colon consistent with contained perforation. Musculoskeletal: No acute bony  abnormality is seen. IMPRESSION: Large fungating mass in the ascending colon with changes highly suggestive of contained perforation and adjacent abscess formation. This extends along the margin of the second portion of the duodenum in the possibility of localized invasion of the mass deserves consideration. Surgical consultation is recommended. Free fluid within the abdomen consistent with the known perforated mass. No definitive extravasation of contrast material is noted at this time consistent with a more contained perforation. Bilateral pleural effusions with mild bibasilar atelectasis. Electronically Signed   By: Inez Catalina M.D.   On: 07/06/2018 17:55    Anti-infectives: Anti-infectives (From admission, onward)   Start     Dose/Rate Route Frequency Ordered Stop   07/06/18 2200  piperacillin-tazobactam (ZOSYN) IVPB 3.375 g     3.375 g 12.5 mL/hr over 240 Minutes Intravenous Every 8 hours 07/06/18 2058        Assessment/Plan: Neglected right colon mass with possible extension to involve the duodenum  Chronic anemia    Discussed surgical options with the patient.  He is severely malnourished and has a very high complication rate for wound breakdown and anastomotic breakdown in this circumstance.  He would benefit from staging scans.  He would also benefit from oncology consultation at some point.  He is unclear about proceeding with surgery despite a lengthy discussion with him today.  He would like to go back home-pro to talk with his friends about this.  He will require a social work consultation at some point to sort out his living situation and better serve his needs since he is more than likely homeless.  He would require surgical tuneup to maximize nutrition of the next couple of weeks.  He is not obstructed at this point time this appears to be a neglected progressive locally advancing colon cancer.  I discussed surgical options with him potential complications of surgery.  He is very  unclear if he desires surgery at this point.  30 minutes spent with the patient with the help of translation line to try to clarify his condition and discuss options.  If he decides to go home, will arrange for follow-up as an outpatient but I doubt he will follow through with this.  I also explained to him that he may return to the emergency department as well as an alternative since he has no social support whatsoever.  The best option would be to stay in the hospital for nutritional support and subsequent resection sometime next week.  I doubt he will follow that recommendation at this point.   LOS: 1 day    Joyice Faster Styles Fambro 07/07/2018

## 2018-07-08 DIAGNOSIS — K6389 Other specified diseases of intestine: Secondary | ICD-10-CM

## 2018-07-08 DIAGNOSIS — Z789 Other specified health status: Secondary | ICD-10-CM

## 2018-07-08 LAB — CBC
HCT: 23.2 % — ABNORMAL LOW (ref 39.0–52.0)
Hemoglobin: 7.7 g/dL — ABNORMAL LOW (ref 13.0–17.0)
MCH: 26.5 pg (ref 26.0–34.0)
MCHC: 33.2 g/dL (ref 30.0–36.0)
MCV: 79.7 fL — ABNORMAL LOW (ref 80.0–100.0)
Platelets: 364 10*3/uL (ref 150–400)
RBC: 2.91 MIL/uL — ABNORMAL LOW (ref 4.22–5.81)
RDW: 20.9 % — ABNORMAL HIGH (ref 11.5–15.5)
WBC: 12.9 10*3/uL — ABNORMAL HIGH (ref 4.0–10.5)
nRBC: 0 % (ref 0.0–0.2)

## 2018-07-08 LAB — BASIC METABOLIC PANEL
Anion gap: 9 (ref 5–15)
BUN: 11 mg/dL (ref 6–20)
CO2: 22 mmol/L (ref 22–32)
Calcium: 7.4 mg/dL — ABNORMAL LOW (ref 8.9–10.3)
Chloride: 101 mmol/L (ref 98–111)
Creatinine, Ser: 0.48 mg/dL — ABNORMAL LOW (ref 0.61–1.24)
GFR calc Af Amer: >60 mL/min (ref >60)
GFR calc non Af Amer: >60 mL/min (ref >60)
Glucose, Bld: 95 mg/dL (ref 70–99)
Potassium: 3.5 mmol/L (ref 3.5–5.1)
Sodium: 132 mmol/L — ABNORMAL LOW (ref 135–145)

## 2018-07-08 LAB — CEA: CEA: 4.1 ng/mL (ref 0.0–4.7)

## 2018-07-08 MED ORDER — ADULT MULTIVITAMIN W/MINERALS CH: 1.0000 | ORAL_TABLET | Freq: Every day | ORAL | Status: AC

## 2018-07-08 MED ORDER — AMOXICILLIN-POT CLAVULANATE 875-125 MG PO TABS: 1.0000 | ORAL_TABLET | Freq: Two times a day (BID) | ORAL | 0 refills | Status: AC

## 2018-07-08 MED ORDER — ONDANSETRON HCL 4 MG PO TABS: 4.0000 mg | ORAL_TABLET | Freq: Four times a day (QID) | ORAL | 0 refills | Status: AC | PRN

## 2018-07-08 MED FILL — AMOX-CLAV 875-125 MG TABLET: 875-125 | 7 days supply | Qty: 14 | Fill #0

## 2018-07-08 MED FILL — ONDANSETRON HCL 4 MG TABLET: 4 | 5 days supply | Qty: 20 | Fill #0

## 2018-07-08 NOTE — Progress Notes (Signed)
Nutrition Follow-up  DOCUMENTATION CODES:   Severe malnutrition in context of social or environmental circumstances, Underweight  INTERVENTION:   -Continue MVI with minerals daily -D/c Boost Breeze -Ensure Enlive po TID, each supplement provides 350 kcal and 20 grams of protein -Magic Cup TID with meals, each supplement provides 290 kcals and 9 grams protein  NUTRITION DIAGNOSIS:   Severe Malnutrition related to social / environmental circumstances as evidenced by severe fat depletion, severe muscle depletion.  Ongoing  GOAL:   Patient will meet greater than or equal to 90% of their needs  Progressing  MONITOR:   PO intake, Diet advancement, Labs, Weight trends, Supplement acceptance, Skin, I & O's  REASON FOR ASSESSMENT:   Consult Assessment of nutrition requirement/status  ASSESSMENT:   Brandon Landry is a 53 y.o. male with medical history significant of depression.  He presents to the ED at [facility] with c/o generalized weakness and hunger.  He apparently is on food stamps and relies on a Education officer, museum to come pick him up and bring him to food bank 5 times a week, though apparently Education officer, museum isnt scheduled to be there every day.  Last ate 2 days ago.  Reviewed I/O's: +352 ml x 24 hours and +747 ml since admission  Per MD notes, CT abdomen and pelvis showed large fungating mass in the ascending colon suggestive for contained perforation and abscess and abscess formation.  Spoke with pt at bedside, who was very sleepy at time of visit. He reports he just finished a walk and felt good doing so. He reports good appetite, consuming 100% of his breakfast, however, does not remember what he ate. Noted two opened Boost Breeze containers at bedside, which pt only took sips out of. Pt reports that he does not like these supplements, as they hurt his stomach. He is willing to try another alternative, such as Ensure. Discussed importance of good meal and supplement intake to promote  healing.  Labs reviewed: Na: 131. K, Mg, and Phos WDL.  NUTRITION - FOCUSED PHYSICAL EXAM:    Most Recent Value  Orbital Region  Moderate depletion  Upper Arm Region  Severe depletion  Thoracic and Lumbar Region  Severe depletion  Buccal Region  Moderate depletion  Temple Region  Moderate depletion  Clavicle Bone Region  Severe depletion  Clavicle and Acromion Bone Region  Severe depletion  Scapular Bone Region  Severe depletion  Dorsal Hand  Severe depletion  Patellar Region  Severe depletion  Anterior Thigh Region  Severe depletion  Posterior Calf Region  Severe depletion  Edema (RD Assessment)  None  Hair  Reviewed  Eyes  Reviewed  Mouth  Reviewed  Skin  Reviewed  Nails  Reviewed       Diet Order:   Diet Order            Diet general        Diet 2 gram sodium Room service appropriate? No; Fluid consistency: Thin  Diet effective now              EDUCATION NEEDS:   Education needs have been addressed  Skin:  Skin Assessment: Reviewed RN Assessment  Last BM:  07/06/18  Height:   Ht Readings from Last 1 Encounters:  07/06/18 5' 8.11" (1.73 m)    Weight:   Wt Readings from Last 1 Encounters:  07/06/18 50.1 kg    Ideal Body Weight:  70 kg  BMI:  Body mass index is 16.74 kg/m.  Estimated Nutritional Needs:  Kcal:  1800-2000  Protein:  100-115 grams  Fluid:  > 1.8 L    Brandon Landry A. Jimmye Norman, RD, LDN, [place] Registered Dietitian II Certified Diabetes Care and Education Specialist Pager: [phone] After hours Pager: [phone]

## 2018-07-08 NOTE — Discharge Summary (Signed)
Physician Discharge Summary  Patterson XFG:[id number] DOB: [birth year] DOA: 07/06/2018  PCP: Patient, No Pcp Per  Admit date: 07/06/2018 Discharge date: 07/08/2018  Admitted From: Home Disposition: Home  Recommendations for Outpatient Follow-up:  1. Follow up with PCP in 1 week with repeat CBC/BMP 2. Outpatient follow-up with general surgery 3. Follow up in ED if symptoms worsen or new appear   Home Health: Home health PT/OT/RN.  Patient refused SNF Equipment/Devices: None  Discharge Condition: Stable CODE STATUS: Full Diet recommendation: Regular  Brief/Interim Summary: 53 year old male with history of iron deficiency anemia, malnutrition and questionable depression with recent hospitalization at [facility] in mid April for anemia with hemoglobin of 3 for which he was supposed to have endoscopy and colonoscopy but he had signed out [place] presented to [facility] ED on 07/05/2018 with generalized weakness and hunger.  Hemoglobin was 3.1.  He was transferred to [facility] for further evaluation and management.  He received 4 units of packed red cells.  CT abdomen and pelvis on 07/06/2018 showed 9.9 to 9.9 cm fungating mass in the ascending colon with concern for perforation and adjacent abscess formation.  General surgery was consulted.  They were planning to do inpatient surgical intervention but patient has been absolutely refusing the same.  He wants to talk to his family/friends.  Patient is currently not obstructed.  General surgery will arrange for outpatient follow-up.  He will be discharged home with outpatient follow-up.  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  They were planning to do inpatient surgical intervention but patient has been absolutely refusing the same.  He wants to talk to his family/friends.  Patient is currently not obstructed.  General surgery will arrange for outpatient follow-up.  He will be discharged home with outpatient follow-up. -He was started on intravenous antibiotics and subsequently switched to Augmentin.  Continue Augmentin on discharge.  Outpatient follow-up with PCP and/or general surgery at earliest convenience  Iron deficiency anemia -Hemoglobin 3.1 on arrival at ED at [facility].  Status post packed red cells transfusion.  Hemoglobin 7.7 today. -Outpatient follow-up  Severe protein calorie malnutrition/significant weight loss -Nutrition consulted during hospitalization.  Outpatient follow-up  Psychosocial issue/probable underlying depression -Outpatient follow-up.  Patient refused SNF.  Home health PT/OT/RN will be arranged  Tobacco abuse -Patient needs to quit smoking.  Discharge Instructions  Discharge Instructions    Diet general   Complete by:  As directed    Increase activity slowly   Complete by:  As directed      Allergies as of 07/08/2018   No Known Allergies     Medication List    TAKE these medications   amoxicillin-clavulanate 875-125 MG tablet Commonly known as:  AUGMENTIN Take 1 tablet by mouth every 12 (twelve) hours for 7 days.   multivitamin with minerals Tabs tablet Take 1 tablet by mouth daily. Start taking on:  Jul 09, 2018   ondansetron 4 MG tablet Commonly known  as:  ZOFRAN Take 1 tablet (4 mg total) by mouth every 6 (six) hours as needed for nausea.            Durable Medical Equipment  (From admission, onward)          Start     Ordered   07/08/18 1056  For home use only DME 3 n 1  Once     07/08/18 1055   07/08/18 1055  For home use only DME Walker rolling  Once    Question:  Patient needs a walker to treat with the following condition  Answer:  Weakness   07/08/18 1055         Follow-up Information    Michael Boston, MD. Go on 07/13/2018.   Specialty:  General Surgery Why:  Your appointment is 07/13/18 at 9:30AM to discuss surgery. Please arrive 30 minutes prior to your appointment to check in and fill out paperwork. Bring photo ID and insurance information. Contact information: [address] [place] [address] [phone]        PCP. Schedule an appointment as soon as possible for a visit in 1 week(s).   Why:  With repeat CBC/BMP         No Known Allergies  Consultations:  General surgery   Procedures/Studies: Ct Abdomen Pelvis W Contrast  Result Date: 07/06/2018 CLINICAL DATA:  Left lower quadrant pain EXAM: CT ABDOMEN AND PELVIS WITH CONTRAST TECHNIQUE: Multidetector CT imaging of the abdomen and pelvis was performed using the standard protocol following bolus administration of intravenous contrast. CONTRAST:  140mL OMNIPAQUE IOHEXOL 300 MG/ML  SOLN COMPARISON:  None. FINDINGS: Lower chest: Small bilateral pleural effusions are noted right greater than left with mild bibasilar atelectatic changes. Hepatobiliary: Liver is well visualized and within normal limits. The gallbladder is well distended without cholelithiasis. Pancreas: Unremarkable. No pancreatic ductal dilatation or surrounding inflammatory changes. Spleen: Normal in size without focal abnormality. Adrenals/Urinary Tract: Adrenal glands are within normal limits. The kidneys demonstrate a normal enhancement pattern and normal excretion bilaterally. A small cortical cyst is noted within the right kidney measuring approximately 1 cm. The bladder is well distended. Stomach/Bowel: The distal colon appears within normal  limits. In the ascending colon there is a large colonic mass with changes highly suggestive of contained perforation. This measures approximately 9.9 x 9.9 cm in greatest AP and transverse dimensions. This extends along the margin of the second portion of the duodenum and duodenal wall thickening is noted suggestive of localized invasion. Mottled air fluid collection is noted adjacent to the column contrast which likely represents contained perforation. Free fluid is noted within the pelvis consistent with the perforation. The appendix is within normal limits. The jejunum and ileum are within normal limits with the exception of some wall thickening in the distal ileum related to the localized inflammatory change from the colonic mass. Vascular/Lymphatic: Aortic atherosclerosis. No enlarged abdominal or pelvic lymph nodes. Reproductive: Prostate is unremarkable. Other: Free fluid is noted within the pelvis as previously described. This also extends higher in the abdomen along the spleen and liver consistent with the findings in the ascending colon consistent with contained perforation. Musculoskeletal: No acute bony abnormality is seen. IMPRESSION: Large fungating mass in the ascending colon with changes highly suggestive of contained perforation and adjacent abscess formation. This extends along the margin of the second portion of the duodenum in the possibility of localized invasion of the mass deserves consideration. Surgical consultation is recommended. Free fluid within the  abdomen consistent with the known perforated mass. No definitive extravasation of contrast material is noted at this time consistent with a more contained perforation. Bilateral pleural effusions with mild bibasilar atelectasis. Electronically Signed   By: Inez Catalina M.D.   On: 07/06/2018 17:55       Subjective: Patient seen and examined at bedside.  He denies any overnight fever or vomiting.  He is absolutely adamant that he wants  to go home today.  Discharge Exam: Vitals:   07/07/18 2117 07/08/18 0406  BP: (!) 123/94 119/88  Pulse: 76 73  Resp:    Temp: 97.7 F (36.5 C) (!) 97.5 F (36.4 C)  SpO2: 100% 100%    General: Pt is alert, awake, not in acute distress.  Intermittently gets angry and states that he wants to go home today.  Looks very thinly built. Cardiovascular: rate controlled, S1/S2 + Respiratory: bilateral decreased breath sounds at bases Abdominal: Soft, NT, ND, bowel sounds + Extremities: Lower extremity pitting edema present edema, no cyanosis    The results of significant diagnostics from this hospitalization (including imaging, microbiology, ancillary and laboratory) are listed below for reference.     Microbiology: Recent Results (from the past 240 hour(s))  SARS Coronavirus 2 (CEPHEID - Performed in [place] hospital lab), Hosp Order     Status: None   Collection Time: 07/06/18  6:16 AM  Result Value Ref Range Status   SARS Coronavirus 2 NEGATIVE NEGATIVE Final    Comment: (NOTE) If result is NEGATIVE SARS-CoV-2 target nucleic acids are NOT DETECTED. The SARS-CoV-2 RNA is generally detectable in upper and lower  respiratory specimens during the acute phase of infection. The lowest  concentration of SARS-CoV-2 viral copies this assay can detect is 250  copies / mL. A negative result does not preclude SARS-CoV-2 infection  and should not be used as the sole basis for treatment or other  patient management decisions.  A negative result may occur with  improper specimen collection / handling, submission of specimen other  than nasopharyngeal swab, presence of viral mutation(s) within the  areas targeted by this assay, and inadequate number of viral copies  (<250 copies / mL). A negative result must be combined with clinical  observations, patient history, and epidemiological information. If result is POSITIVE SARS-CoV-2 target nucleic acids are DETECTED. The SARS-CoV-2 RNA is  generally detectable in upper and lower  respiratory specimens dur ing the acute phase of infection.  Positive  results are indicative of active infection with SARS-CoV-2.  Clinical  correlation with patient history and other diagnostic information is  necessary to determine patient infection status.  Positive results do  not rule out bacterial infection or co-infection with other viruses. If result is PRESUMPTIVE POSTIVE SARS-CoV-2 nucleic acids MAY BE PRESENT.   A presumptive positive result was obtained on the submitted specimen  and confirmed on repeat testing.  While 2019 novel coronavirus  (SARS-CoV-2) nucleic acids may be present in the submitted sample  additional confirmatory testing may be necessary for epidemiological  and / or clinical management purposes  to differentiate between  SARS-CoV-2 and other Sarbecovirus currently known to infect humans.  If clinically indicated additional testing with an alternate test  methodology [phone]) is advised. The SARS-CoV-2 RNA is generally  detectable in upper and lower respiratory sp ecimens during the acute  phase of infection. The expected result is Negative. Fact Sheet for Patients:  StrictlyIdeas.no Fact Sheet for Healthcare Providers: BankingDealers.co.za This test is not yet approved or  cleared by the [place] and has been authorized for detection and/or diagnosis of SARS-CoV-2 by FDA under an Emergency Use Authorization (EUA).  This EUA will remain in effect (meaning this test can be used) for the duration of the COVID-19 declaration under Section 564(b)(1) of the Act, 21 U.S.C. section 360bbb-3(b)(1), unless the authorization is terminated or revoked sooner. Performed at [facility], [place] [address] [place], [place] [address]      Labs: BNP (last 3 results) No results for input(s): BNP in the last 8760 hours. Basic Metabolic Panel: Recent Labs  Lab  07/06/18 0237 07/06/18 0240 07/06/18 1615 07/07/18 0404 07/07/18 1554 07/08/18 0314  NA  --  134*  --  131*  --  132*  K  --  2.9*  --  3.7  --  3.5  CL  --  107  --  103  --  101  CO2  --  22  --  19*  --  22  GLUCOSE  --  105*  --  89  --  95  BUN  --  15  --  12  --  11  CREATININE  --  0.47*  --  0.46*  --  0.48*  CALCIUM  --  7.4*  --  7.4*  --  7.4*  MG 1.7  --  2.1  --  2.0  --   PHOS 2.7  --   --   --  2.7  --    Liver Function Tests: Recent Labs  Lab 07/06/18 1615  AST 11*  ALT 12  ALKPHOS 60  BILITOT 1.7*  PROT 5.1*  ALBUMIN 1.6*   Recent Labs  Lab 07/06/18 1615  LIPASE 17   No results for input(s): AMMONIA in the last 168 hours. CBC: Recent Labs  Lab 07/06/18 0240 07/06/18 0351 07/06/18 1702 07/07/18 0404 07/08/18 0314  WBC  --  16.1* 18.1* 17.6* 12.9*  HGB 5.5* 5.3* 9.1* 8.3* 7.7*  HCT 16.6* 16.1* 26.9* 24.3* 23.2*  MCV  --  80.9 78.2* 78.1* 79.7*  PLT  --  342 317 348 364   Cardiac Enzymes: No results for input(s): CKTOTAL, CKMB, CKMBINDEX, TROPONINI in the last 168 hours. BNP: Invalid input(s): POCBNP CBG: No results for input(s): GLUCAP in the last 168 hours. D-Dimer No results for input(s): DDIMER in the last 72 hours. Hgb A1c No results for input(s): HGBA1C in the last 72 hours. Lipid Profile No results for input(s): CHOL, HDL, LDLCALC, TRIG, CHOLHDL, LDLDIRECT in the last 72 hours. Thyroid function studies No results for input(s): TSH, T4TOTAL, T3FREE, THYROIDAB in the last 72 hours.  Invalid input(s): FREET3 Anemia work up No results for input(s): VITAMINB12, FOLATE, FERRITIN, TIBC, IRON, RETICCTPCT in the last 72 hours. Urinalysis No results found for: COLORURINE, APPEARANCEUR, [place], [place], [place], [place], [place], [place], PROTEINUR, UROBILINOGEN, NITRITE, LEUKOCYTESUR Sepsis Labs Invalid input(s): PROCALCITONIN,  WBC,  LACTICIDVEN Microbiology Recent Results (from the past 240 hour(s))  SARS Coronavirus 2  (CEPHEID - Performed in [place] hospital lab), Hosp Order     Status: None   Collection Time: 07/06/18  6:16 AM  Result Value Ref Range Status   SARS Coronavirus 2 NEGATIVE NEGATIVE Final    Comment: (NOTE) If result is NEGATIVE SARS-CoV-2 target nucleic acids are NOT DETECTED. The SARS-CoV-2 RNA is generally detectable in upper and lower  respiratory specimens during the acute phase of infection. The lowest  concentration of SARS-CoV-2 viral copies this assay can detect is  250  copies / mL. A negative result does not preclude SARS-CoV-2 infection  and should not be used as the sole basis for treatment or other  patient management decisions.  A negative result may occur with  improper specimen collection / handling, submission of specimen other  than nasopharyngeal swab, presence of viral mutation(s) within the  areas targeted by this assay, and inadequate number of viral copies  (<250 copies / mL). A negative result must be combined with clinical  observations, patient history, and epidemiological information. If result is POSITIVE SARS-CoV-2 target nucleic acids are DETECTED. The SARS-CoV-2 RNA is generally detectable in upper and lower  respiratory specimens dur ing the acute phase of infection.  Positive  results are indicative of active infection with SARS-CoV-2.  Clinical  correlation with patient history and other diagnostic information is  necessary to determine patient infection status.  Positive results do  not rule out bacterial infection or co-infection with other viruses. If result is PRESUMPTIVE POSTIVE SARS-CoV-2 nucleic acids MAY BE PRESENT.   A presumptive positive result was obtained on the submitted specimen  and confirmed on repeat testing.  While 2019 novel coronavirus  (SARS-CoV-2) nucleic acids may be present in the submitted sample  additional confirmatory testing may be necessary for epidemiological  and / or clinical management purposes  to differentiate  between  SARS-CoV-2 and other Sarbecovirus currently known to infect humans.  If clinically indicated additional testing with an alternate test  methodology [id number]) is advised. The SARS-CoV-2 RNA is generally  detectable in upper and lower respiratory sp ecimens during the acute  phase of infection. The expected result is Negative. Fact Sheet for Patients:  StrictlyIdeas.no Fact Sheet for Healthcare Providers: BankingDealers.co.za This test is not yet approved or cleared by the [place] FDA and has been authorized for detection and/or diagnosis of SARS-CoV-2 by FDA under an Emergency Use Authorization (EUA).  This EUA will remain in effect (meaning this test can be used) for the duration of the COVID-19 declaration under Section 564(b)(1) of the Act, 21 U.S.C. section 360bbb-3(b)(1), unless the authorization is terminated or revoked sooner. Performed at [facility], [place] [address] [place], [place] [address]      Time coordinating discharge: 35 minutes  SIGNED:   Aline August, MD  [facility] 07/08/2018, 2:04 PM

## 2018-07-08 NOTE — Progress Notes (Signed)
[facility] Progress Note     Subjective: CC-  Interpretor used during visit. Patient states that he feels fine, adamant that he wants to go home. Feels like he is in prison. States that he needs to talk with his family prior to considering any surgery. States that he must leave the hospital in order to talk with family. Willing to come back for an appointment to discuss surgery in the future.  Objective: Vital signs in last 24 hours: Temp:  [97.5 F (36.4 C)-97.7 F (36.5 C)] 97.5 F (36.4 C) (05/06 0406) Pulse Rate:  [73-80] 73 (05/06 0406) Resp:  [18] 18 (05/05 1539) BP: (119-123)/(79-94) 119/88 (05/06 0406) SpO2:  [100 %] 100 % (05/06 0406) Last BM Date: 07/06/18  Intake/Output from previous day: 05/05 0701 - 05/06 0700 In: 351.8 [I.V.:351.8] Out: -  Intake/Output this shift: Total I/O In: 120 [P.O.:120] Out: -   PE: Gen:  Alert, NAD HEENT: EOM's intact, pupils equal and round Pulm:  effort normal Abd: palpable right upper quadrant mass, nontender, no peritonitis nondistended, +BS Ext: calves soft and nontender without edema Skin: no rashes noted, warm and dry  Lab Results:  Recent Labs    07/07/18 0404 07/08/18 0314  WBC 17.6* 12.9*  HGB 8.3* 7.7*  HCT 24.3* 23.2*  PLT 348 364   BMET Recent Labs    07/07/18 0404 07/08/18 0314  NA 131* 132*  K 3.7 3.5  CL 103 101  CO2 19* 22  GLUCOSE 89 95  BUN 12 11  CREATININE 0.46* 0.48*  CALCIUM 7.4* 7.4*   PT/INR No results for input(s): LABPROT, INR in the last 72 hours. CMP     Component Value Date/Time   NA 132 (L) 07/08/2018 0314   K 3.5 07/08/2018 0314   CL 101 07/08/2018 0314   CO2 22 07/08/2018 0314   GLUCOSE 95 07/08/2018 0314   BUN 11 07/08/2018 0314   CREATININE 0.48 (L) 07/08/2018 0314   CALCIUM 7.4 (L) 07/08/2018 0314   PROT 5.1 (L) 07/06/2018 1615   ALBUMIN 1.6 (L) 07/06/2018 1615   AST 11 (L) 07/06/2018 1615   ALT 12 07/06/2018 1615   ALKPHOS 60 07/06/2018 1615    BILITOT 1.7 (H) 07/06/2018 1615   GFRNONAA >60 07/08/2018 0314   GFRAA >60 07/08/2018 0314   Lipase     Component Value Date/Time   LIPASE 17 07/06/2018 1615       Studies/Results: Ct Abdomen Pelvis W Contrast  Result Date: 07/06/2018 CLINICAL DATA:  Left lower quadrant pain EXAM: CT ABDOMEN AND PELVIS WITH CONTRAST TECHNIQUE: Multidetector CT imaging of the abdomen and pelvis was performed using the standard protocol following bolus administration of intravenous contrast. CONTRAST:  133mL OMNIPAQUE IOHEXOL 300 MG/ML  SOLN COMPARISON:  None. FINDINGS: Lower chest: Small bilateral pleural effusions are noted right greater than left with mild bibasilar atelectatic changes. Hepatobiliary: Liver is well visualized and within normal limits. The gallbladder is well distended without cholelithiasis. Pancreas: Unremarkable. No pancreatic ductal dilatation or surrounding inflammatory changes. Spleen: Normal in size without focal abnormality. Adrenals/Urinary Tract: Adrenal glands are within normal limits. The kidneys demonstrate a normal enhancement pattern and normal excretion bilaterally. A small cortical cyst is noted within the right kidney measuring approximately 1 cm. The bladder is well distended. Stomach/Bowel: The distal colon appears within normal limits. In the ascending colon there is a large colonic mass with changes highly suggestive of contained perforation. This measures approximately 9.9 x 9.9 cm in greatest AP and  transverse dimensions. This extends along the margin of the second portion of the duodenum and duodenal wall thickening is noted suggestive of localized invasion. Mottled air fluid collection is noted adjacent to the column contrast which likely represents contained perforation. Free fluid is noted within the pelvis consistent with the perforation. The appendix is within normal limits. The jejunum and ileum are within normal limits with the exception of some wall thickening in the  distal ileum related to the localized inflammatory change from the colonic mass. Vascular/Lymphatic: Aortic atherosclerosis. No enlarged abdominal or pelvic lymph nodes. Reproductive: Prostate is unremarkable. Other: Free fluid is noted within the pelvis as previously described. This also extends higher in the abdomen along the spleen and liver consistent with the findings in the ascending colon consistent with contained perforation. Musculoskeletal: No acute bony abnormality is seen. IMPRESSION: Large fungating mass in the ascending colon with changes highly suggestive of contained perforation and adjacent abscess formation. This extends along the margin of the second portion of the duodenum in the possibility of localized invasion of the mass deserves consideration. Surgical consultation is recommended. Free fluid within the abdomen consistent with the known perforated mass. No definitive extravasation of contrast material is noted at this time consistent with a more contained perforation. Bilateral pleural effusions with mild bibasilar atelectasis. Electronically Signed   By: Inez Catalina M.D.   On: 07/06/2018 17:55    Anti-infectives: Anti-infectives (From admission, onward)   Start     Dose/Rate Route Frequency Ordered Stop   07/07/18 1300  amoxicillin-clavulanate (AUGMENTIN) 875-125 MG per tablet 1 tablet     1 tablet Oral Every 12 hours 07/07/18 1255 07/14/18 0959   07/06/18 2200  piperacillin-tazobactam (ZOSYN) IVPB 3.375 g  Status:  Discontinued     3.375 g 12.5 mL/hr over 240 Minutes Intravenous Every 8 hours 07/06/18 2058 07/07/18 1255       Assessment/Plan Chronic anemia Severe malnutrition  Neglected right colon mass with possible extension to involve the duodenum - Patient adamant that he is going home, will leave AMA if not discharged. Again discussed our recommendation that he should remain in the hospital to improve nutritional status and consider surgery next week. States that  he is not going to stay, but he is willing to come back to our office to discuss. I will arrange follow up in our office. Appreciate social work assistance   ID - zosyn 5/7>>5/5, augmentin 5/5>> FEN - sodium restriction diet, Boost VTE - SCDs Foley - none Follow up - arranging f/u with colorectal surgeon   LOS: 2 days    Wellington Hampshire , [facility] 07/08/2018, 11:00 AM Pager: [phone] Mon-Thurs 7:00 am-4:30 pm Fri 7:00 am -11:30 AM Sat-Sun 7:00 am-11:30 am

## 2018-07-08 NOTE — TOC Transition Note (Addendum)
Transition of Care [facility]) - CM/SW Discharge Note   Patient Details  Name: Brandon Landry MRN: [id number] Date of Birth: [birth year]  [facility]) CM/SW Contact:  Benard Halsted, LCSW Phone Number: 07/08/2018, 11:06 AM   Clinical Narrative:    [facility] will follow patient at home. Walker and bedside commode to be delivered to patient's room. Patient reported that he did not have a phone at home when CSW inquired. However, per [facility], patient told them that he has a phone that he takes to [facility] every so often to use their wifi [phone]). CSW provided taxi voucher home. CSW also completed [facility] APS report to see if he can be followed at home.   Final next level of care: [place] Barriers to Discharge: No Barriers Identified   Patient Goals and CMS Choice Patient states their goals for this hospitalization and ongoing recovery are:: Return home CMS Medicare.gov Compare Post Acute Care list provided to:: Patient Choice offered to / list presented to : Patient  Discharge Placement                       Discharge Plan and Services In-house Referral: Clinical Social Work, Counselling psychologist, Risk manager Acute Care Choice: Museum/gallery conservator, Home Health          DME Arranged: 3-N-1, Walker rolling DME Agency: [employer] Date DME Agency Contacted: 07/07/18 Time DME Agency Contacted: [phone] Representative spoke with at DME Agency: Allen Pidgeon: RN, PT, Nurse's Aide [place] Agency: [facility] Date [place]: 07/07/18 Time [place]: [place] Representative spoke with at [place]: [place] ([place]) Interventions     Readmission Risk Interventions Readmission Risk Prevention Plan 07/07/2018  Transportation Screening Complete

## 2018-07-09 ENCOUNTER — Telehealth: Payer: Self-pay | Admitting: Surgery

## 2018-07-09 NOTE — Telephone Encounter (Addendum)
Brandon Landry  [birth year] [id number]  Patient Care Team: Patient, No Pcp Per as PCP - General (General Practice)  This patient is a 53 y.o.male   Reason for call: Large tumor in upper abdomen.  Apparently patient being sent to me to consider surgery since refused evaluation in the hospital.  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  Patient wished to go home despite having anemia.  Had been in hemorrhagic shock requiring requiring massive transfusion at outside hospital in [place] recently.   I reviewed the images with Dr. Henderson Landry with radiology and my partner with surgical oncology, Dr. Barry Landry.  Giant mass thick-walled most likely of origin of the colon at the hepatic flexure.  There is extension into the inferior tip of the right hepatic lobe.  The margin between the second part of the duodenum as well as the pancreatic head, especially in the uncinate process, is obliterated.  Not able to determine if direct invasion or just inflammation.  Gallbladder is free.  Right kidney appears to be free.  There is no obvious ductal dilatation but hard to tell if the bile ducts are involved.  There is some compression of the inferior vena cava but no definite direct invasion.  Superior mesenteric artery and vein are not involved.  Concern for possible contained perforation versus tumor extension.  Certainly inflamed.  At the present state it would be very difficult to resect.    Should:   Discuss at multidisciplinary GI tumor board.    Patient needs GI work-up including colonoscopy and upper endoscopy/EUS to evaluate and diagnose cancer.  See if there is invasion into the duodenal bulb.  Is the ampulla involved?Marland Kitchen    MRI/MRCP to assess hepatobiliary & pancreatic anatomy as well.   CT of chest to complete staging.   If there is no evidence of metastatic disease, ideally patient would get neoadjuvant chemoradiation therapy to help  shrink the tumor and assess margins.  Most likely would involve a major open resection (right colectomy, wedge resection of liver, possible pancreaticoduodenectomy, probable duodenal wall resection with primary closure or Roux-en-Y duodenojejunostomy reconstruction) in conjunction with Dr. Barry Landry and myself or other partner with colorectal surgery.  There is been an issue with compliance with this patient coming wishing to leave the hospital.  Sounds like there is a language barrier and probable social support barriers as well  Patient Active Problem List   Diagnosis Date Noted  . Non-English speaking patient Brandon Landry) 07/08/2018    Priority: Medium  . 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 Iron deficiency anemia due to chronic blood loss 07/06/2018  . Symptomatic anemia 07/06/2018  . Protein-calorie malnutrition, severe ([place]) 07/06/2018  . Chronic systolic CHF (congestive heart failure) ([place]) 07/06/2018  . Lower extremity edema 07/06/2018  . Leukocytosis 07/06/2018  . Abdominal pain 07/06/2018  . Psychosocial stressors 07/06/2018  . Tobacco use disorder 07/06/2018  . Hypokalemia 07/06/2018  . Hypomagnesemia 07/06/2018    Past Medical History:  Diagnosis Date  . Depression     No past surgical history on file.  Social History   Socioeconomic History  . Marital status: Single    Spouse name: Not on file  . Number of children: Not on file  . Years of education: Not on file  . Highest education level: Not on file  Occupational History  . Not on file  Social Needs  .  Financial resource strain: Not on file  . Food insecurity:    Worry: Not on file    Inability: Not on file  . Transportation needs:    Medical: Not on file    Non-medical: Not on file  Tobacco Use  . Smoking status: Light Tobacco Smoker  Substance and Sexual Activity  . Alcohol use: Never    Frequency: Never  . Drug use: Never  . Sexual activity: Not on file   Lifestyle  . Physical activity:    Days per week: Not on file    Minutes per session: Not on file  . Stress: Not on file  Relationships  . Social connections:    Talks on phone: Not on file    Gets together: Not on file    Attends religious service: Not on file    Active member of club or organization: Not on file    Attends meetings of clubs or organizations: Not on file    Relationship status: Not on file  . Intimate partner violence:    Fear of current or ex partner: Not on file    Emotionally abused: Not on file    Physically abused: Not on file    Forced sexual activity: Not on file  Other Topics Concern  . Not on file  Social History Narrative  . Not on file    Family History  Problem Relation Age of Onset  . Hypertension Neg Hx   . Diabetes Neg Hx   . Stroke Neg Hx   . Cancer Neg Hx     Current Outpatient Medications  Medication Sig Dispense Refill  . amoxicillin-clavulanate (AUGMENTIN) 875-125 MG tablet Take 1 tablet by mouth every 12 (twelve) hours for 7 days. 14 tablet 0  . Multiple Vitamin (MULTIVITAMIN WITH MINERALS) TABS tablet Take 1 tablet by mouth daily.    . ondansetron (ZOFRAN) 4 MG tablet Take 1 tablet (4 mg total) by mouth every 6 (six) hours as needed for nausea. 20 tablet 0   No current facility-administered medications for this visit.      No Known Allergies  @VS @  Ct Abdomen Pelvis W Contrast  Result Date: 07/06/2018 CLINICAL DATA:  Left lower quadrant pain EXAM: CT ABDOMEN AND PELVIS WITH CONTRAST TECHNIQUE: Multidetector CT imaging of the abdomen and pelvis was performed using the standard protocol following bolus administration of intravenous contrast. CONTRAST:  189mL OMNIPAQUE IOHEXOL 300 MG/ML  SOLN COMPARISON:  None. FINDINGS: Lower chest: Small bilateral pleural effusions are noted right greater than left with mild bibasilar atelectatic changes. Hepatobiliary: Liver is well visualized and within normal limits. The gallbladder is well  distended without cholelithiasis. Pancreas: Unremarkable. No pancreatic ductal dilatation or surrounding inflammatory changes. Spleen: Normal in size without focal abnormality. Adrenals/Urinary Tract: Adrenal glands are within normal limits. The kidneys demonstrate a normal enhancement pattern and normal excretion bilaterally. A small cortical cyst is noted within the right kidney measuring approximately 1 cm. The bladder is well distended. Stomach/Bowel: The distal colon appears within normal limits. In the ascending colon there is a large colonic mass with changes highly suggestive of contained perforation. This measures approximately 9.9 x 9.9 cm in greatest AP and transverse dimensions. This extends along the margin of the second portion of the duodenum and duodenal wall thickening is noted suggestive of localized invasion. Mottled air fluid collection is noted adjacent to the column contrast which likely represents contained perforation. Free fluid is noted within the pelvis consistent with the perforation. The appendix  is within normal limits. The jejunum and ileum are within normal limits with the exception of some wall thickening in the distal ileum related to the localized inflammatory change from the colonic mass. Vascular/Lymphatic: Aortic atherosclerosis. No enlarged abdominal or pelvic lymph nodes. Reproductive: Prostate is unremarkable. Other: Free fluid is noted within the pelvis as previously described. This also extends higher in the abdomen along the spleen and liver consistent with the findings in the ascending colon consistent with contained perforation. Musculoskeletal: No acute bony abnormality is seen. IMPRESSION: Large fungating mass in the ascending colon with changes highly suggestive of contained perforation and adjacent abscess formation. This extends along the margin of the second portion of the duodenum in the possibility of localized invasion of the mass deserves consideration.  Surgical consultation is recommended. Free fluid within the abdomen consistent with the known perforated mass. No definitive extravasation of contrast material is noted at this time consistent with a more contained perforation. Bilateral pleural effusions with mild bibasilar atelectasis. Electronically Signed   By: Inez Catalina M.D.   On: 07/06/2018 17:55    Note: This dictation was prepared with Dragon/digital dictation along with [employer]. Any transcriptional errors that result from this process are unintentional.   .Adin Hector, M.D., F.A.C.S. Gastrointestinal and Minimally Invasive Surgery [facility] [address] [place], [place] [id number] [phone] Main / Paging  07/09/2018 11:14 AM

## 2018-07-18 NOTE — Care Management (Signed)
This is a no charge note  Transfer from [place] to [place] SDU per PA, [employer], Male, 53 y.o., Nov 28, 1965, MRN: [id number]  53 year old man, with a past medical history of depression, iron deficiency anemia, tobacco abuse, [place] speaking, who presents with generalized weakness.  Patient was recently hospitalized from 5/4-5/6 due to severe anemia with hemoglobin 3.1, transfused with blood, hgb back up to 7.7 07/08/2018. CT abdomen and pelvis on 07/06/2018 showed 9.9 cm fungating mass in the ascending colon with concern for perforation and adjacent abscess formation, possible cancer. General surgery was consulted.  They were planning to do inpatient surgical intervention but patient has been absolutely refusing the same per discharge summary.  Patient was discharged on Augmentin, supposed to follow-up with surgeon, but patient did not do so. Today, patient comes back with generalized weakness.  Found to have hemoglobin 3.2. Pt will be transfused with 2 unit of blood.  Patient was given 1 L of fluid.  His initial blood pressure was 88/46, which improved to 94/50 after giving 1L liter of normal saline bolus.  He had negative COVID-19 test on 07/05/2017, no symptoms for COVID-19 today.  Patient was found to have WBC 21.6, heart rate of 102--> 79, RR 18, oxygen saturation 93% on room air, temperature normal.  Potassium 3.1, creatinine 0.5, BUN 21, glucose 76. Pt is accepted to [facility] as inpt. EDP will consult general surgeon.   Please call manager of [facility] at [phone] when pt arrives to floor   Ivor Costa, MD  [facility]   If 7PM-7AM, please contact night-coverage [email or web address] Password [facility] 07/18/2018, 11:27 PM

## 2018-07-19 ENCOUNTER — Inpatient Hospital Stay (HOSPITAL_COMMUNITY): Payer: Medicaid Other

## 2018-07-19 ENCOUNTER — Encounter (HOSPITAL_COMMUNITY): Payer: Self-pay

## 2018-07-19 ENCOUNTER — Inpatient Hospital Stay (HOSPITAL_COMMUNITY)
Admission: AD | Admit: 2018-07-19 | Discharge: 2018-08-07 | DRG: 811 | Discharge status: Against Medical Advice | Payer: Medicaid Other | Source: Other Acute Inpatient Hospital | Attending: Internal Medicine | Admitting: Internal Medicine

## 2018-07-19 DIAGNOSIS — D735 Infarction of spleen: Secondary | ICD-10-CM | POA: Diagnosis present

## 2018-07-19 DIAGNOSIS — D6869 Other thrombophilia: Secondary | ICD-10-CM | POA: Diagnosis present

## 2018-07-19 DIAGNOSIS — C799 Secondary malignant neoplasm of unspecified site: Secondary | ICD-10-CM | POA: Diagnosis not present

## 2018-07-19 DIAGNOSIS — E876 Hypokalemia: Secondary | ICD-10-CM | POA: Diagnosis present

## 2018-07-19 DIAGNOSIS — R109 Unspecified abdominal pain: Secondary | ICD-10-CM

## 2018-07-19 DIAGNOSIS — Z515 Encounter for palliative care: Secondary | ICD-10-CM | POA: Diagnosis present

## 2018-07-19 DIAGNOSIS — R29710 NIHSS score 10: Secondary | ICD-10-CM | POA: Diagnosis not present

## 2018-07-19 DIAGNOSIS — I6340 Cerebral infarction due to embolism of unspecified cerebral artery: Secondary | ICD-10-CM | POA: Diagnosis present

## 2018-07-19 DIAGNOSIS — F600 Paranoid personality disorder: Secondary | ICD-10-CM | POA: Diagnosis present

## 2018-07-19 DIAGNOSIS — R64 Cachexia: Secondary | ICD-10-CM | POA: Diagnosis present

## 2018-07-19 DIAGNOSIS — M7989 Other specified soft tissue disorders: Secondary | ICD-10-CM | POA: Diagnosis present

## 2018-07-19 DIAGNOSIS — E43 Unspecified severe protein-calorie malnutrition: Secondary | ICD-10-CM | POA: Diagnosis present

## 2018-07-19 DIAGNOSIS — R188 Other ascites: Secondary | ICD-10-CM | POA: Diagnosis present

## 2018-07-19 DIAGNOSIS — F17200 Nicotine dependence, unspecified, uncomplicated: Secondary | ICD-10-CM | POA: Diagnosis not present

## 2018-07-19 DIAGNOSIS — K6389 Other specified diseases of intestine: Secondary | ICD-10-CM | POA: Diagnosis not present

## 2018-07-19 DIAGNOSIS — D72829 Elevated white blood cell count, unspecified: Secondary | ICD-10-CM | POA: Diagnosis not present

## 2018-07-19 DIAGNOSIS — I313 Pericardial effusion (noninflammatory): Secondary | ICD-10-CM | POA: Diagnosis present

## 2018-07-19 DIAGNOSIS — R29898 Other symptoms and signs involving the musculoskeletal system: Secondary | ICD-10-CM | POA: Diagnosis not present

## 2018-07-19 DIAGNOSIS — I7410 Embolism and thrombosis of unspecified parts of aorta: Secondary | ICD-10-CM | POA: Diagnosis present

## 2018-07-19 DIAGNOSIS — D500 Iron deficiency anemia secondary to blood loss (chronic): Principal | ICD-10-CM | POA: Diagnosis present

## 2018-07-19 DIAGNOSIS — I6349 Cerebral infarction due to embolism of other cerebral artery: Secondary | ICD-10-CM | POA: Diagnosis not present

## 2018-07-19 DIAGNOSIS — R1010 Upper abdominal pain, unspecified: Secondary | ICD-10-CM | POA: Diagnosis not present

## 2018-07-19 DIAGNOSIS — D649 Anemia, unspecified: Secondary | ICD-10-CM | POA: Diagnosis present

## 2018-07-19 DIAGNOSIS — Z5329 Procedure and treatment not carried out because of patient's decision for other reasons: Secondary | ICD-10-CM | POA: Diagnosis present

## 2018-07-19 DIAGNOSIS — J90 Pleural effusion, not elsewhere classified: Secondary | ICD-10-CM | POA: Diagnosis present

## 2018-07-19 DIAGNOSIS — Z1159 Encounter for screening for other viral diseases: Secondary | ICD-10-CM | POA: Diagnosis not present

## 2018-07-19 DIAGNOSIS — R600 Localized edema: Secondary | ICD-10-CM | POA: Diagnosis present

## 2018-07-19 DIAGNOSIS — K920 Hematemesis: Secondary | ICD-10-CM | POA: Diagnosis present

## 2018-07-19 DIAGNOSIS — C185 Malignant neoplasm of splenic flexure: Secondary | ICD-10-CM | POA: Diagnosis present

## 2018-07-19 DIAGNOSIS — F329 Major depressive disorder, single episode, unspecified: Secondary | ICD-10-CM | POA: Diagnosis not present

## 2018-07-19 DIAGNOSIS — Z681 Body mass index (BMI) 19 or less, adult: Secondary | ICD-10-CM | POA: Diagnosis not present

## 2018-07-19 DIAGNOSIS — Z66 Do not resuscitate: Secondary | ICD-10-CM | POA: Diagnosis present

## 2018-07-19 DIAGNOSIS — K631 Perforation of intestine (nontraumatic): Secondary | ICD-10-CM | POA: Diagnosis present

## 2018-07-19 DIAGNOSIS — I639 Cerebral infarction, unspecified: Secondary | ICD-10-CM | POA: Diagnosis not present

## 2018-07-19 DIAGNOSIS — I959 Hypotension, unspecified: Secondary | ICD-10-CM | POA: Diagnosis present

## 2018-07-19 DIAGNOSIS — R112 Nausea with vomiting, unspecified: Secondary | ICD-10-CM

## 2018-07-19 DIAGNOSIS — I6310 Cerebral infarction due to embolism of unspecified precerebral artery: Secondary | ICD-10-CM | POA: Diagnosis not present

## 2018-07-19 DIAGNOSIS — I340 Nonrheumatic mitral (valve) insufficiency: Secondary | ICD-10-CM

## 2018-07-19 HISTORY — DX: Other specified diseases of intestine: K63.89

## 2018-07-19 HISTORY — DX: Anemia, unspecified: D64.9

## 2018-07-19 LAB — CBC
HCT: 25.2 % — ABNORMAL LOW (ref 39.0–52.0)
Hemoglobin: 8.1 g/dL — ABNORMAL LOW (ref 13.0–17.0)
MCH: 26.7 pg (ref 26.0–34.0)
MCHC: 32.1 g/dL (ref 30.0–36.0)
MCV: 83.2 fL (ref 80.0–100.0)
Platelets: 372 10*3/uL (ref 150–400)
RBC: 3.03 MIL/uL — ABNORMAL LOW (ref 4.22–5.81)
RDW: 17.3 % — ABNORMAL HIGH (ref 11.5–15.5)
WBC: 26.4 10*3/uL — ABNORMAL HIGH (ref 4.0–10.5)
nRBC: 0 % (ref 0.0–0.2)

## 2018-07-19 LAB — COMPREHENSIVE METABOLIC PANEL
ALT: 11 U/L (ref 0–44)
AST: 13 U/L — ABNORMAL LOW (ref 15–41)
Albumin: 1.5 g/dL — ABNORMAL LOW (ref 3.5–5.0)
Alkaline Phosphatase: 83 U/L (ref 38–126)
Anion gap: 12 (ref 5–15)
BUN: 17 mg/dL (ref 6–20)
CO2: 19 mmol/L — ABNORMAL LOW (ref 22–32)
Calcium: 7.4 mg/dL — ABNORMAL LOW (ref 8.9–10.3)
Chloride: 104 mmol/L (ref 98–111)
Creatinine, Ser: 0.5 mg/dL — ABNORMAL LOW (ref 0.61–1.24)
GFR calc Af Amer: >60 mL/min (ref >60)
GFR calc non Af Amer: >60 mL/min (ref >60)
Glucose, Bld: 84 mg/dL (ref 70–99)
Potassium: 3.2 mmol/L — ABNORMAL LOW (ref 3.5–5.1)
Sodium: 135 mmol/L (ref 135–145)
Total Bilirubin: 1.7 mg/dL — ABNORMAL HIGH (ref 0.3–1.2)
Total Protein: 5.1 g/dL — ABNORMAL LOW (ref 6.5–8.1)

## 2018-07-19 LAB — CBC WITH DIFFERENTIAL/PLATELET
Abs Immature Granulocytes: 0.25 10*3/uL — ABNORMAL HIGH (ref 0.00–0.07)
Basophils Absolute: 0 10*3/uL (ref 0.0–0.1)
Basophils Relative: 0 %
Eosinophils Absolute: 0.1 10*3/uL (ref 0.0–0.5)
Eosinophils Relative: 0 %
HCT: 23.7 % — ABNORMAL LOW (ref 39.0–52.0)
Hemoglobin: 7.6 g/dL — ABNORMAL LOW (ref 13.0–17.0)
Immature Granulocytes: 1 %
Lymphocytes Relative: 5 %
Lymphs Abs: 1.2 10*3/uL (ref 0.7–4.0)
MCH: 26.5 pg (ref 26.0–34.0)
MCHC: 32.1 g/dL (ref 30.0–36.0)
MCV: 82.6 fL (ref 80.0–100.0)
Monocytes Absolute: 2 10*3/uL — ABNORMAL HIGH (ref 0.1–1.0)
Monocytes Relative: 8 %
Neutro Abs: 22.7 10*3/uL — ABNORMAL HIGH (ref 1.7–7.7)
Neutrophils Relative %: 86 %
Platelets: 341 10*3/uL (ref 150–400)
RBC: 2.87 MIL/uL — ABNORMAL LOW (ref 4.22–5.81)
RDW: 17.2 % — ABNORMAL HIGH (ref 11.5–15.5)
WBC: 26.2 10*3/uL — ABNORMAL HIGH (ref 4.0–10.5)
nRBC: 0.1 % (ref 0.0–0.2)

## 2018-07-19 LAB — IRON AND TIBC
Iron: 101 ug/dL (ref 45–182)
Saturation Ratios: 66 % — ABNORMAL HIGH (ref 17.9–39.5)
TIBC: 153 ug/dL — ABNORMAL LOW (ref 250–450)
UIBC: 52 ug/dL

## 2018-07-19 LAB — ECHOCARDIOGRAM COMPLETE: Weight: 1844.81 oz

## 2018-07-19 LAB — MAGNESIUM: Magnesium: 2 mg/dL (ref 1.7–2.4)

## 2018-07-19 LAB — APTT: aPTT: 34 seconds (ref 24–36)

## 2018-07-19 LAB — RETICULOCYTES
Immature Retic Fract: 18.7 % — ABNORMAL HIGH (ref 2.3–15.9)
RBC.: 3.03 MIL/uL — ABNORMAL LOW (ref 4.22–5.81)
Retic Count, Absolute: 173 10*3/uL (ref 19.0–186.0)
Retic Ct Pct: 5.4 % — ABNORMAL HIGH (ref 0.4–3.1)

## 2018-07-19 LAB — BRAIN NATRIURETIC PEPTIDE: B Natriuretic Peptide: 213.2 pg/mL — ABNORMAL HIGH (ref 0.0–100.0)

## 2018-07-19 LAB — PROTIME-INR
INR: 1.3 — ABNORMAL HIGH (ref 0.8–1.2)
Prothrombin Time: 16.2 seconds — ABNORMAL HIGH (ref 11.4–15.2)

## 2018-07-19 LAB — PREPARE RBC (CROSSMATCH)

## 2018-07-19 LAB — VITAMIN B12: Vitamin B-12: 687 pg/mL (ref 180–914)

## 2018-07-19 LAB — GLUCOSE, CAPILLARY: Glucose-Capillary: 85 mg/dL (ref 70–99)

## 2018-07-19 LAB — FOLATE: Folate: 9.7 ng/mL (ref >5.9)

## 2018-07-19 LAB — FERRITIN: Ferritin: 12 ng/mL — ABNORMAL LOW (ref 24–336)

## 2018-07-19 MED ORDER — ACETAMINOPHEN 325 MG PO TABS
650.0000 mg | ORAL_TABLET | Freq: Four times a day (QID) | ORAL | Status: DC | PRN
  Administered 2018-07-19 – 2018-07-31 (×7): 650 mg via ORAL
  Filled 2018-07-19 (×8): qty 2

## 2018-07-19 MED ORDER — PIPERACILLIN-TAZOBACTAM 3.375 G IVPB: 3.3750 g | Freq: Three times a day (TID) | INTRAVENOUS | Status: DC

## 2018-07-19 MED ORDER — SODIUM CHLORIDE 0.9 % IV SOLN
INTRAVENOUS | Status: DC | PRN
  Administered 2018-07-19: 250 mL via INTRAVENOUS
  Administered 2018-08-03: 12:00:00 via INTRAVENOUS
  Administered 2018-08-03 – 2018-08-05 (×2): 250 mL via INTRAVENOUS

## 2018-07-19 MED ORDER — PIPERACILLIN-TAZOBACTAM 3.375 G IVPB 30 MIN
3.3750 g | Freq: Once | INTRAVENOUS | Status: AC
  Administered 2018-07-19: 3.375 g via INTRAVENOUS
  Filled 2018-07-19 (×2): qty 50

## 2018-07-19 MED ORDER — POTASSIUM CHLORIDE CRYS ER 20 MEQ PO TBCR
40.0000 meq | EXTENDED_RELEASE_TABLET | ORAL | Status: AC
  Administered 2018-07-19 (×2): 40 meq via ORAL
  Filled 2018-07-19 (×2): qty 2

## 2018-07-19 MED ORDER — ONDANSETRON HCL 4 MG/2ML IJ SOLN
4.0000 mg | Freq: Four times a day (QID) | INTRAMUSCULAR | Status: DC | PRN
  Administered 2018-07-30 – 2018-08-03 (×7): 4 mg via INTRAVENOUS
  Filled 2018-07-19 (×9): qty 2

## 2018-07-19 MED ORDER — ACETAMINOPHEN 650 MG RE SUPP: 650.0000 mg | Freq: Four times a day (QID) | RECTAL | Status: DC | PRN

## 2018-07-19 MED ORDER — PIPERACILLIN-TAZOBACTAM 3.375 G IVPB
3.3750 g | Freq: Three times a day (TID) | INTRAVENOUS | Status: DC
  Administered 2018-07-19 – 2018-07-30 (×30): 3.375 g via INTRAVENOUS
  Filled 2018-07-19 (×29): qty 50

## 2018-07-19 MED ORDER — NICOTINE 21 MG/24HR TD PT24
21.0000 mg | MEDICATED_PATCH | Freq: Every day | TRANSDERMAL | Status: DC
  Administered 2018-07-19 – 2018-08-06 (×19): 21 mg via TRANSDERMAL
  Filled 2018-07-19 (×19): qty 1

## 2018-07-19 MED ORDER — SODIUM CHLORIDE 0.9 % IV SOLN
INTRAVENOUS | Status: DC
  Administered 2018-07-19 – 2018-07-24 (×4): via INTRAVENOUS

## 2018-07-19 MED ORDER — SODIUM CHLORIDE 0.9% IV SOLUTION: Freq: Once | INTRAVENOUS | Status: DC

## 2018-07-19 MED ORDER — ADULT MULTIVITAMIN W/MINERALS CH
1.0000 | ORAL_TABLET | Freq: Every day | ORAL | Status: DC
  Administered 2018-07-19 – 2018-07-20 (×2): 1 via ORAL
  Filled 2018-07-19 (×2): qty 1

## 2018-07-19 MED ORDER — ONDANSETRON HCL 4 MG PO TABS
4.0000 mg | ORAL_TABLET | Freq: Four times a day (QID) | ORAL | Status: DC | PRN
  Filled 2018-07-19 (×2): qty 1

## 2018-07-19 NOTE — Consult Note (Addendum)
Consultation Note Date: 07/19/2018   Patient Name: Brandon Landry  DOB: 1966-01-01  MRN: [id number]  Age / Sex: 53 y.o., male  PCP: Patient, No Pcp Per Referring Physician: Jonetta Osgood, MD  Reason for Consultation: Establishing goals of care  HPI/Patient Profile: 53 y.o. [place] male who speaks little english with past medical history of 9.9 x 9.9 cm fungating colonic mass with possible abscess and contained perforation that was originally imaged on 07/06/2018, severe malnutrition, and tobacco use who was transferred from [facility] on 07/19/2018 with severe anemia (Hgb of 3.2).  When the mass was first imaged the patient had presented with severe anemia as well.  During the admission from 5/4 - 5/6 he received blood transfusion and surgery consultation.  He refused surgery and was discharged with a Hgb of 7.7.  He returned to the ER this admission requesting surgery.  Clinical Assessment and Goals of Care:  I have reviewed medical records including EPIC notes, labs and imaging, received report from Dr. Sloan Leiter, and assessed the patient.  I will need a [place] interpreter to discuss diagnosis prognosis, [place], EOL wishes, disposition and options.  Patient stated "please help me.  I don't want to die."  Later he asked for a coke.  Other than that I was unable to understand his responses to my questions about his family, surrogate decision makers.  I was unable to discuss his current health situation with him.    As far as functional and nutritional status he has severe malnutrition with an albumin of 1.5.  Surgery has noted that prior to any procedure he would need "tenting up" with TPN.  PMT will return on 5/18 with a [place] interpreter to have a [place] conversation.  Primary Decision Maker:  PATIENT    SUMMARY OF RECOMMENDATIONS    PMT will return 5/18 with [place] interpreter - hopefully after CT  Chest/Abd/Pelvis are completed.  Code Status/Advance Care Planning:  Full   Discharge Planning: To Be Determined      Primary Diagnoses: Present on Admission: . Symptomatic anemia . Tobacco use disorder . 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 Iron deficiency anemia due to chronic blood loss . Hypokalemia . Left arm weakness . Protein-calorie malnutrition, severe ([place]) . Lower extremity edema   I have reviewed the medical record, interviewed the patient and family, and examined the patient. The following aspects are pertinent.  Past Medical History:  Diagnosis Date  . Anemia   . Colonic mass   . Depression    Social History   Socioeconomic History  . Marital status: Single    Spouse name: Not on file  . Number of children: Not on file  . Years of education: Not on file  . Highest education level: Not on file  Occupational History  . Not on file  Social Needs  . Financial resource strain: Not on file  . Food insecurity:    Worry: Not on file    Inability: Not on file  .  Transportation needs:    Medical: Not on file    Non-medical: Not on file  Tobacco Use  . Smoking status: Light Tobacco Smoker  Substance and Sexual Activity  . Alcohol use: Never    Frequency: Never  . Drug use: Never  . Sexual activity: Not on file  Lifestyle  . Physical activity:    Days per week: Not on file    Minutes per session: Not on file  . Stress: Not on file  Relationships  . Social connections:    Talks on phone: Not on file    Gets together: Not on file    Attends religious service: Not on file    Active member of club or organization: Not on file    Attends meetings of clubs or organizations: Not on file    Relationship status: Not on file  Other Topics Concern  . Not on file  Social History Narrative  . Not on file   Family History  Problem Relation Age of Onset  . Hypertension Neg Hx   . Diabetes Neg Hx   . Stroke Neg Hx    . Cancer Neg Hx    Scheduled Meds: . sodium chloride   Intravenous Once  . multivitamin with minerals  1 tablet Oral Daily  . nicotine  21 mg Transdermal Daily   Continuous Infusions: . sodium chloride 250 mL (07/19/18 1444)  . piperacillin-tazobactam (ZOSYN)  IV 3.375 g (07/19/18 1445)   PRN Meds:.sodium chloride, acetaminophen **OR** acetaminophen, ondansetron **OR** ondansetron (ZOFRAN) IV No Known Allergies Review of Systems need interpreter  Physical Exam  Well developed male, awake, alert, cooperative.  Speaks primarily [place]. CV rrr Resp no distress Abdomen thin. LE 2+ edema  Vital Signs: BP 101/73   Pulse 75   Temp 97.7 F (36.5 C) (Oral)   Resp 18   Wt 52.3 kg   SpO2 100%   BMI 17.47 kg/m  Pain Scale: Faces   Pain Score: Asleep   SpO2: SpO2: 100 % O2 Device:SpO2: 100 % O2 Flow Rate: .   IO: Intake/output summary:   Intake/Output Summary (Last 24 hours) at 07/19/2018 1527 Last data filed at 07/19/2018 0800 Gross per 24 hour  Intake 50 ml  Output -  Net 50 ml    LBM:   Baseline Weight: Weight: 52.3 kg Most recent weight: Weight: 52.3 kg     Palliative Assessment/Data:60%     Time In: 4:00 Time Out: 4:30 Time Total: 30 min. Greater than 50%  of this time was spent counseling and coordinating care related to the above assessment and plan.  Signed by: Florentina Jenny, PA-C Palliative Medicine Pager: [phone]  Please contact Palliative Medicine Team phone at [phone] for questions and concerns.  For individual provider: See Shea Evans

## 2018-07-19 NOTE — Care Management (Signed)
CM following for [facility] needs. Patient will be assigned to charity agency at DC. CM will follow for medication assistance and PCP. Appointment at [facility] clinic will be made closer to time of DC.

## 2018-07-19 NOTE — Consult Note (Signed)
Reason for Consult:colon mass Referring Physician: Dr. Oren Binet   Brandon Landry is an 53 y.o. male.  HPI: This is a 53 year old gentleman speaks [place] who was admitted on 07/06/2018 with a large fungating mass in the hepatic flexure of the colon.  At that admission surgery was discussed with the patient in detail.  It was recommended that he stay in the hospital to maximize his nutrition prior to any resection.  The patient decided to not have surgery at that time and was discharged home despite surgical recommendations.  He presented back to [facility] last evening and was found to have profound anemia and was transferred back to [facility].  He was found to have elevated white blood count of 26,000.  He has currently been transfused and is now hemodynamically stable.  Past Medical History:  Diagnosis Date  . Depression     No past surgical history on file.  Family History  Problem Relation Age of Onset  . Hypertension Neg Hx   . Diabetes Neg Hx   . Stroke Neg Hx   . Cancer Neg Hx     Social History:  reports that he has been smoking. He does not have any smokeless tobacco history on file. He reports that he does not drink alcohol or use drugs.  Allergies: No Known Allergies  Medications: I have reviewed the patient's current medications.  Results for orders placed or performed during the hospital encounter of 07/19/18 (from the past 48 hour(s))  Type and screen [place]     Status: None   Collection Time: 07/19/18  5:11 AM  Result Value Ref Range   ABO/RH(D) B POS    Antibody Screen NEG    Sample Expiration      07/22/2018,2359 Performed at [facility], [place] [address], [place], [place] [address]   Protime-INR     Status: Abnormal   Collection Time: 07/19/18  5:34 AM  Result Value Ref Range   Prothrombin Time 16.2 (H) 11.4 - 15.2 seconds   INR 1.3 (H) 0.8 - 1.2    Comment: (NOTE) INR goal varies based on device and disease  states. Performed at [facility], [place] [address], [place], [place] [address]   APTT     Status: None   Collection Time: 07/19/18  5:34 AM  Result Value Ref Range   aPTT 34 24 - 36 seconds    Comment: Performed at [facility] [address] [place], [place] [address]  Magnesium     Status: None   Collection Time: 07/19/18  5:34 AM  Result Value Ref Range   Magnesium 2.0 1.7 - 2.4 mg/dL    Comment: Performed at [facility] [address], [place], [place] [address]  CBC     Status: Abnormal   Collection Time: 07/19/18  5:34 AM  Result Value Ref Range   WBC 26.4 (H) 4.0 - 10.5 K/uL   RBC 3.03 (L) 4.22 - 5.81 MIL/uL   Hemoglobin 8.1 (L) 13.0 - 17.0 g/dL   HCT 25.2 (L) 39.0 - 52.0 %   MCV 83.2 80.0 - 100.0 fL   MCH 26.7 26.0 - 34.0 pg   MCHC 32.1 30.0 - 36.0 g/dL   RDW 17.3 (H) 11.5 - 15.5 %   Platelets 372 150 - 400 K/uL   nRBC 0.0 0.0 - 0.2 %    Comment: Performed at [facility], [place],  [place] [address]  Vitamin B12     Status: None   Collection Time: 07/19/18  5:34 AM  Result Value Ref Range   Vitamin B-12 687 180 - 914 pg/mL    Comment: (NOTE) This assay is not validated for testing neonatal or myeloproliferative syndrome specimens for Vitamin B12 levels. Performed at [facility], [place] [address], [place], [place] [address]   Folate     Status: None   Collection Time: 07/19/18  5:34 AM  Result Value Ref Range   Folate 9.7 >5.9 ng/mL    Comment: Performed at [facility] [address] [place], [place] [address]  Iron and TIBC     Status: Abnormal   Collection Time: 07/19/18  5:34 AM  Result Value Ref Range   Iron 101 45 - 182 ug/dL   TIBC 153 (L) 250 - 450 ug/dL   Saturation Ratios 66 (H) 17.9 - 39.5 %   UIBC 52 ug/dL    Comment: Performed at [facility] [address], [place], [place] [address]  Ferritin     Status: Abnormal   Collection Time: 07/19/18  5:34 AM  Result Value Ref Range    Ferritin 12 (L) 24 - 336 ng/mL    Comment: Performed at [facility], [place] [address], [place], [place] [address]  Reticulocytes     Status: Abnormal   Collection Time: 07/19/18  5:34 AM  Result Value Ref Range   Retic Ct Pct 5.4 (H) 0.4 - 3.1 %    Comment: RESULTS CONFIRMED BY MANUAL DILUTION   RBC. 3.03 (L) 4.22 - 5.81 MIL/uL   Retic Count, Absolute 173.0 19.0 - 186.0 K/uL   Immature Retic Fract 18.7 (H) 2.3 - 15.9 %    Comment: Performed at [facility] [address], [place], [place] [address]  Prepare RBC     Status: None   Collection Time: 07/19/18  5:34 AM  Result Value Ref Range   Order Confirmation      ORDER PROCESSED BY BLOOD BANK Performed at [facility], [place] [address], [place], [place] [address]   Comprehensive metabolic panel     Status: Abnormal   Collection Time: 07/19/18  5:34 AM  Result Value Ref Range   Sodium 135 135 - 145 mmol/L   Potassium 3.2 (L) 3.5 - 5.1 mmol/L   Chloride 104 98 - 111 mmol/L   CO2 19 (L) 22 - 32 mmol/L   Glucose, Bld 84 70 - 99 mg/dL   BUN 17 6 - 20 mg/dL   Creatinine, Ser 0.50 (L) 0.61 - 1.24 mg/dL   Calcium 7.4 (L) 8.9 - 10.3 mg/dL   Total Protein 5.1 (L) 6.5 - 8.1 g/dL   Albumin 1.5 (L) 3.5 - 5.0 g/dL   AST 13 (L) 15 - 41 U/L   ALT 11 0 - 44 U/L   Alkaline Phosphatase 83 38 - 126 U/L   Total Bilirubin 1.7 (H) 0.3 - 1.2 mg/dL   GFR calc non Af Amer >60 >60 mL/min   GFR calc Af Amer >60 >60 mL/min   Anion gap 12 5 - 15    Comment: Performed at [facility], [place] [address], [place], [place] [address]  Glucose, capillary     Status: None   Collection Time: 07/19/18  7:55 AM  Result Value Ref Range   Glucose-Capillary 85 70 - 99 mg/dL    No results found.  Review  of Systems  Unable to perform ROS: Language   Blood pressure 102/75, pulse 81, temperature (!) 97.4 F (36.3 C), temperature source Oral, resp. rate 18, weight 52.3 kg, SpO2 100 %. Physical Exam  Constitutional: No distress.  Cachectic,  ill-appearing gentleman  HENT:  Head: Normocephalic and atraumatic.  Right Ear: External ear normal.  Eyes: Pupils are equal, round, and reactive to light. No scleral icterus.  Neck: Normal range of motion. No tracheal deviation present.  Cardiovascular: Normal rate, regular rhythm and normal heart sounds.  Respiratory: Effort normal and breath sounds normal.  GI: Soft. He exhibits no distension. There is abdominal tenderness.  There is tenderness with guarding in the right upper quadrant.  Musculoskeletal: Normal range of motion.        General: No edema.  Neurological: He is alert.  Skin: Skin is warm and dry. No erythema.  Psychiatric: His behavior is normal.    Assessment/Plan: Advanced colon cancer\  Again, his previous CT scan was worrisome for contained perforation as well as invasion into the duodenum.  Given his elevated white blood count, he will need a CT scan of his chest, abdomen, and pelvis to see if any abscess is present or if the malignancy has become more advanced.  Currently, he should undergo continued resuscitation.  We would recommend a PICC line and TNA with significant tenting up prior to any surgical intervention.  Coralie Keens 07/19/2018, 9:06 AM

## 2018-07-19 NOTE — Progress Notes (Signed)
Patient arrived to the unit alert and oriented. 2nd unit of blood completed in route to [facility].  Patient's primary language is [place] but patient can communicate in English a little. Translator was called as well. BP stable, patient denied any pain. Skin intact but patient does have some swelling in L hand and bilateral legs.   Patient also had a foley placed at [facility] The patient had discharge around the foley on arrival to [facility]. MD advised to remove the foley and monitor.  Patient oriented to unit and equipment. Call bell in reach. Bed locked and in lowest position.   Will continue to monitor.

## 2018-07-19 NOTE — Progress Notes (Signed)
Consult received. Will need formal PT/OT recommendations. Once recommendations are received, CSW will assist as needed for discharge planing.

## 2018-07-19 NOTE — Progress Notes (Signed)
Patient has nor voided > 8hrs post foley removal. Bladder scan = 350 cc. Patient denies any urge to void. MD text paged. Will continue to monitor

## 2018-07-19 NOTE — H&P (Signed)
History and Physical    Brandon Landry DGL:[id number] DOB: 1965-05-02 DOA: 07/19/2018  Referring MD/NP/PA:   PCP: Patient, No Pcp Per   Patient coming from:  The patient is coming from home.  At baseline, pt is partially dependent for most of ADL.        Chief Complaint: Generalized weakness  HPI: Brandon Landry is a 53 y.o. male with medical history significant of depression, iron deficiency anemia, tobacco abuse, who presents with generalized weakness.  Patient is transfer from [facility] due to symptomatic anemia.  Patient was recently hospitalized from 5/4-5/6 due to severe anemia with hemoglobin 3.1, transfused with blood, hgb back up to 7.7 07/08/2018. CT abdomen and pelvis on 07/06/2018 showed 9.9 cm fungating mass in the ascending colon with concern for perforation and adjacent abscess formation, possible cancer. General surgery was consulted. They were planning to do inpatient surgical intervention but patient has been absolutely refusing the same per discharge summary. Patient was discharged on Augmentin, supposed to follow-up with surgeon, but patient did not do so. Today, patient comes back with generalized weakness.  Found to have hemoglobin 3.2. Pt was transfused with 2 unit of blood.  Patient was given 1 L of fluid.  His initial blood pressure was 88/46, which improved to 94/50 after giving 1L liter of normal saline bolus.  He had negative COVID-19 test on 07/05/2017, no symptoms for COVID-19 today.  Per ED physician, patient want to do surgery this time.  General surgeon, Dr. Rosendo Gros was consulted.  Patient was found to have WBC 21.6, heart rate of 102--> 79, RR 18, oxygen saturation 93% on room air, temperature normal.  Potassium 3.1, creatinine 0.5, BUN 21, glucose 76, negative urinalysis, proBNP 2160, INR 1.4, PTT 25.6, normal liver function.  When I saw pt at floor, patient complains of generalized weakness, but denies any pain.  No nausea vomiting, diarrhea or abdominal pain.  No  chest pain, shortness breath, cough.  Denies fever or chills.  He has left arm weakness, but he is not sure how long has this been going on. He seems to have mild slurry speech. No facial droop.  No vision loss or hearing loss. His left forearm is swollen and cool. He has bilateral leg edema.  Temperature normal, blood pressure 102/75, heart rate 88, oxygen saturation 100% on room air, RR 18. Pt is admitted to SDU as inpt.  Review of Systems:   General: no fevers, chills, no body weight gain, has poor appetite, has fatigue and generalized weakness HEENT: no blurry vision, hearing changes or sore throat Respiratory: no dyspnea, coughing, wheezing CV: no chest pain, no palpitations GI: no nausea, vomiting, abdominal pain, diarrhea, constipation GU: no dysuria, burning on urination, increased urinary frequency, hematuria  Ext: has leg edema and left arm swelling. The left arm is cool to touch, but has intact pulse which is confirmed by portable Doppler. Neuro: no vision change or hearing loss. Has left arm weakness and mild slurry speech. Skin: no rash, no skin tear. MSK: No muscle spasm, no deformity, no limitation of range of movement in spin Heme: No easy bruising.  Travel history: No recent long distant travel.  Allergy: No Known Allergies  Past Medical History:  Diagnosis Date  . Depression     No past surgical history on file.  Social History:  reports that he has been smoking. He does not have any smokeless tobacco history on file. He reports that he does not drink alcohol or use drugs.  Family History:  Family History  Problem Relation Age of Onset  . Hypertension Neg Hx   . Diabetes Neg Hx   . Stroke Neg Hx   . Cancer Neg Hx      Prior to Admission medications   Medication Sig Start Date End Date Taking? Authorizing Provider  Multiple Vitamin (MULTIVITAMIN WITH MINERALS) TABS tablet Take 1 tablet by mouth daily. 07/09/18   Aline August, MD  ondansetron (ZOFRAN) 4 MG  tablet Take 1 tablet (4 mg total) by mouth every 6 (six) hours as needed for nausea. 07/08/18   Aline August, MD    Physical Exam: Vitals:   07/19/18 0426  BP: 102/75  Pulse: 81  Resp: 18  Temp: (!) 97.4 F (36.3 C)  TempSrc: Oral  SpO2: 100%   General: Not in acute distress. Pale and cachectic looking. HEENT:       Eyes: PERRL, EOMI, no scleral icterus.       ENT: No discharge from the ears and nose, no pharynx injection, no tonsillar enlargement.        Neck: No JVD, no bruit, no mass felt. Heme: No neck lymph node enlargement. Cardiac: S1/S2, RRR, No murmurs, No gallops or rubs. Respiratory: No rales, wheezing, rhonchi or rubs. GI: Soft, nondistended, nontender, no rebound pain, no organomegaly, BS present. GU: No hematuria Ext: 2+ pitting leg edema bilaterally. 2+DP/PT pulse bilaterally. Musculoskeletal: No joint deformities, No joint redness or warmth, no limitation of ROM in spin. Skin: No rashes.  Neuro: Alert, oriented X3, cranial nerves II-XII grossly intact. Muscle strength 0-1/5 in left arm and 4/5 in other extremities, sensation to light touch intact. Negative Babinski's sign. Psych: Patient is not psychotic, no suicidal or hemocidal ideation.  Labs on Admission: I have personally reviewed following labs and imaging studies  CBC: No results for input(s): WBC, NEUTROABS, HGB, HCT, MCV, PLT in the last 168 hours. Basic Metabolic Panel: No results for input(s): NA, K, CL, CO2, GLUCOSE, BUN, CREATININE, CALCIUM, MG, PHOS in the last 168 hours. GFR: Estimated Creatinine Clearance: 75.7 mL/min (A) (by C-G formula based on SCr of 0.48 mg/dL (L)). Liver Function Tests: No results for input(s): AST, ALT, ALKPHOS, BILITOT, PROT, ALBUMIN in the last 168 hours. No results for input(s): LIPASE, AMYLASE in the last 168 hours. No results for input(s): AMMONIA in the last 168 hours. Coagulation Profile: No results for input(s): INR, PROTIME in the last 168 hours. Cardiac  Enzymes: No results for input(s): CKTOTAL, CKMB, CKMBINDEX, TROPONINI in the last 168 hours. BNP (last 3 results) No results for input(s): PROBNP in the last 8760 hours. HbA1C: No results for input(s): HGBA1C in the last 72 hours. CBG: No results for input(s): GLUCAP in the last 168 hours. Lipid Profile: No results for input(s): CHOL, HDL, LDLCALC, TRIG, CHOLHDL, LDLDIRECT in the last 72 hours. Thyroid Function Tests: No results for input(s): TSH, T4TOTAL, FREET4, T3FREE, THYROIDAB in the last 72 hours. Anemia Panel: No results for input(s): VITAMINB12, FOLATE, FERRITIN, TIBC, IRON, RETICCTPCT in the last 72 hours. Urine analysis: No results found for: COLORURINE, APPEARANCEUR, LABSPEC, PHURINE, GLUCOSEU, HGBUR, BILIRUBINUR, KETONESUR, PROTEINUR, UROBILINOGEN, NITRITE, LEUKOCYTESUR Sepsis Labs: @LABRCNTIP (procalcitonin:4,lacticidven:4) )No results found for this or any previous visit (from the past 240 hour(s)).   Radiological Exams on Admission: No results found.   EKG: Not done in ED, will get one.   Assessment/Plan Principal Problem:   Symptomatic anemia Active Problems:   Iron deficiency anemia due to chronic blood loss   Protein-calorie malnutrition, severe (  [place])   Lower extremity edema   Tobacco use disorder   Hypokalemia   Giant mass of hepatic flexure of colon - probable perforated colon cancer   Left arm weakness   Left arm swelling   Symptomatic anemia: Hemoglobin is 3.2 (7.7 on 07/08/2018). Most likely due to GI loss given his large fungating mass in the ascending colon. He had hypotension with blood pressure 88/48 in [facility], which improved to 102/75 currently after giving 1 L normal saline bolus and 2 L of blood transfusion.  Currently hemodynamically stable.  - will admit to progressive bed as inpatient - will transfuse 2 more units of blood - INR/PTT/type & screen - CBC q6h - Check anemia panel - CM and SW consult  Giant mass of hepatic flexure  of colon: probable perforated colon cancer vs. abscess.  Patient has leukocytosis of 21.6.  No fever.  Currently does not seem to have sepsis. Pt want to do surgery this time.  Dr. Rosendo Gros of surgery was consulted. Floor manage has informed him of pt's arrival. -start zosyn IV -f/u bx  Iron deficiency anemia due to chronic blood loss: -check anemia panel  Protein-calorie malnutrition, severe ([place]): -Nutrition consult  Lower extremity edema: Etiology is not clear.  Possibly due to hypoalbuminemia, albumin 1.9 on admission.  proBNP elevated at 2160, but patient does not have shortness of breath or JVD.  Chest x-ray showed COPD and emphysema without pulmonary edema.  Patient denies history of heart disease. -will get 2d echo for further evaluation  Tobacco use disorder: -Nicotine patch  Hypokalemia: K=3.1 -will 40 mEq of KCl -check Mg level  Left arm weakness: Patient is not sure how long it has been going on.  Etiology is not clear.  Differential diagnosis include stroke versus metastasized disease if patient has colon cancer. -MRI-brain with and without contrast. If patient has stroke will need to have further work-up and treatment.  Left arm swelling: -check L UE doppler to r/o DVT    Inpatient status:  # Patient requires inpatient status due to high intensity of service, high risk for further deterioration and high frequency of surveillance required.  I certify that at the point of admission it is my clinical judgment that the patient will require inpatient hospital care spanning beyond 2 midnights from the point of admission.  . This patient has chronic comorbidities including depression, iron deficiency anemia, tobacco abuse . Now patient has presenting with severe symptomatic anemia with hemoglobin 3.2, left arm weakness, bilateral leg edema, left forearm swelling, and giant mass of hepatic flexure of colon (probable perforated colon cancer vs. Abscess), severe protein calorie  malnutrition. . The worrisome physical exam findings include pale looking, cachectic looking, left arm weakness, left arm swelling, bilateral leg swelling . The initial radiographic and laboratory data are worrisome because of hemoglobin 3.2, leukocytosis, hypokalemia . Current medical needs: please see my assessment and plan . Predictability of an adverse outcome (risk): Patient presents with multiple serious issue, including  severe symptomatic anemia with hemoglobin 3.2, left arm weakness, bilateral leg edema, left forearm swelling, and giant mass of hepatic flexure of colon (probable perforated colon cancer vs. Abscess) and severe protein calorie malnutrition. His presentation is highly complicated, patient is at high risk of deteriorating. Will need to be treated in hospital for at least 2 days.   DVT ppx: SCD Code Status: Full code Family Communication: None at bed side.     Disposition Plan:  To be determined Consults called:  Dr.  Rosendo Gros Admission status: SDU/inpation       Date of Service 07/19/2018    Ivor Costa [facility]   If 7PM-7AM, please contact night-coverage [email or web address] Password [facility] 07/19/2018, 5:36 AM

## 2018-07-19 NOTE — Progress Notes (Signed)
  Echocardiogram 2D Echocardiogram has been performed.  Brandon Landry 07/19/2018, 4:04 PM

## 2018-07-19 NOTE — Progress Notes (Signed)
Pharmacy Antibiotic Note  Brandon Landry is a 53 y.o. male admitted on 07/19/2018 with intra-abdominal infection .  Pharmacy has been consulted for zosyn dosing. SCr 0.5, CrCl ~ 61ml/min  Plan: Zosyn 3.375g IV every 8 hours Monitor renal function, Cx and LOT     Temp (24hrs), Avg:97.4 F (36.3 C), Min:97.4 F (36.3 C), Max:97.4 F (36.3 C)  No results for input(s): WBC, CREATININE, LATICACIDVEN, VANCOTROUGH, VANCOPEAK, VANCORANDOM, GENTTROUGH, GENTPEAK, GENTRANDOM, TOBRATROUGH, TOBRAPEAK, TOBRARND, AMIKACINPEAK, AMIKACINTROU, AMIKACIN in the last 168 hours.  Estimated Creatinine Clearance: 75.7 mL/min (A) (by C-G formula based on SCr of 0.48 mg/dL (L)).    No Known Allergies  Antimicrobials this admission: Zosyn 5/17>>  Dose adjustments this admission: n/a  Microbiology results: 5/17 BCx: sent  [name], PharmD Clinical Pharmacist Please check AMION for all [place] numbers 07/19/2018 4:47 AM

## 2018-07-19 NOTE — Progress Notes (Signed)
PROGRESS NOTE        PATIENT DETAILS Name: Brandon Landry Age: 53 y.o. Sex: male Date of Birth: April 26, 1965 Admit Date: 07/19/2018 Admitting Physician Ivor Costa, MD [id number], No Pcp Per  Brief Narrative: Patient is a 53 y.o. male who was hospitalized from 5/4 to 5/6 after he was found to have severe microcytic anemia with a hemoglobin of 3.1, he was transfused PRBC, CT scan of the abdomen on 5/4 showed a large fungating mass in the ascending colon with contained perforation and adjacent abscess formation, general surgery was subsequently consulted-surgical intervention was contemplated-however patient refused and was subsequently discharged home on 5/6.  He returned [facility] on 5/16 with generalized weakness, and was found to have a hemoglobin of 3.2.  He was transfused 2 units of PRBC, and transferred to [facility].  He now wants to pursue surgical options.  See below for further details  Subjective: Lying comfortably in bed-asking me when he will have surgery.  Wants to eat.  Claims that his left arm started getting weak 3-4 days back.  Assessment/Plan: Severe microcytic anemia: Secondary to chronic blood loss and acute illness.  No overt bleeding noted.  Hemoglobin stable after 2 units of PRBC at [facility].  Continue supportive care, recheck CBC tomorrow.  Fungating mass in the ascending colon with probable perforation and adjacent abscess: Abdominal exam appears benign-discussed with general surgery-plans are to repeat CT of the abdomen and chest.  Per Dr. Lora Havens for clear liquid diet.  Await further recommendations from general surgery once CT scan obtained.  Blood cultures on 5/17 pending  Left arm weakness: Appears to have isolated left arm weakness-this is in a background of severe weakness and deconditioning-await MRI brain to rule out subtle CVA or mets to the brain.  Even if he has a CVA-suspect that further work-up will not  change management in this setting.  Left arm swelling/lower extremity swelling: Suspect this is secondary to hypoalbuminemia-but will await Doppler studies that have been ordered by the admitting MD.  Hypokalemia: Continue to replete.  Severe malnutrition: Albumin of 1.5-we will consult nutrition services.  Ethics/palliative care: Unfortunate 53 year old Asian male with what appears to be advanced colon cancer-he appears very frail and debilitated/deconditioned.  Not sure if surgery will be curative-prudent to start involving palliative care-as I suspect his prognosis is not that good.  DVT Prophylaxis: SCD's  Code Status: Full code  Family Communication: None at bedside  Disposition Plan: Remain inpatient  Antimicrobial agents: Anti-infectives (From admission, onward)   Start     Dose/Rate Route Frequency Ordered Stop   07/19/18 1400  piperacillin-tazobactam (ZOSYN) IVPB 3.375 g     3.375 g 12.5 mL/hr over 240 Minutes Intravenous Every 8 hours 07/19/18 0651     07/19/18 0600  piperacillin-tazobactam (ZOSYN) IVPB 3.375 g  Status:  Discontinued     3.375 g 12.5 mL/hr over 240 Minutes Intravenous Every 8 hours 07/19/18 0442 07/19/18 0447   07/19/18 0500  piperacillin-tazobactam (ZOSYN) IVPB 3.375 g     3.375 g 100 mL/hr over 30 Minutes Intravenous  Once 07/19/18 0447 07/19/18 0626      Procedures: None  CONSULTS:  general surgery  Time spent: 25- minutes-Greater than 50% of this time was spent in counseling, explanation of diagnosis, planning of further management, and coordination of care.  MEDICATIONS: Scheduled Meds: . sodium chloride  Intravenous Once  . multivitamin with minerals  1 tablet Oral Daily  . nicotine  21 mg Transdermal Daily   Continuous Infusions: . piperacillin-tazobactam (ZOSYN)  IV     PRN Meds:.acetaminophen **OR** acetaminophen, ondansetron **OR** ondansetron (ZOFRAN) IV   PHYSICAL EXAM: Vital signs: Vitals:   07/19/18 0426 07/19/18  0700  BP: 102/75   Pulse: 81   Resp: 18   Temp: (!) 97.4 F (36.3 C)   TempSrc: Oral   SpO2: 100%   Weight:  52.3 kg   Filed Weights   07/19/18 0700  Weight: 52.3 kg   Body mass index is 17.47 kg/m.   General appearance :Awake, alert, not in any distress.  HEENT: Atraumatic and Normocephalic Neck: supple Resp:Good air entry bilaterally, no added sounds  CVS: S1 S2 regular GI: Bowel sounds present, Non tender and not distended with no gaurding, rigidity or rebound.No organomegaly Extremities: B/L Lower Ext shows no edema, both legs are warm to touch Neurology: Difficult exam due to language barrier but seems to move all 4 extremities-but does have significant amount of left left upper extremity weakness compared to his other extremities.  He does have significant amount of generalized weakness all over. Musculoskeletal:No digital cyanosis Skin:No Rash, warm and dry Wounds:N/A  I have personally reviewed following labs and imaging studies  LABORATORY DATA: CBC: Recent Labs  Lab 07/19/18 0534 07/19/18 0953  WBC 26.4* 26.2*  NEUTROABS  --  22.7*  HGB 8.1* 7.6*  HCT 25.2* 23.7*  MCV 83.2 82.6  PLT 372 938    Basic Metabolic Panel: Recent Labs  Lab 07/19/18 0534  NA 135  K 3.2*  CL 104  CO2 19*  GLUCOSE 84  BUN 17  CREATININE 0.50*  CALCIUM 7.4*  MG 2.0    GFR: Estimated Creatinine Clearance: 79 mL/min (A) (by C-G formula based on SCr of 0.5 mg/dL (L)).  Liver Function Tests: Recent Labs  Lab 07/19/18 0534  AST 13*  ALT 11  ALKPHOS 83  BILITOT 1.7*  PROT 5.1*  ALBUMIN 1.5*   No results for input(s): LIPASE, AMYLASE in the last 168 hours. No results for input(s): AMMONIA in the last 168 hours.  Coagulation Profile: Recent Labs  Lab 07/19/18 0534  INR 1.3*    Cardiac Enzymes: No results for input(s): CKTOTAL, CKMB, CKMBINDEX, TROPONINI in the last 168 hours.  BNP (last 3 results) No results for input(s): PROBNP in the last 8760 hours.   HbA1C: No results for input(s): HGBA1C in the last 72 hours.  CBG: Recent Labs  Lab 07/19/18 0755  GLUCAP 85    Lipid Profile: No results for input(s): CHOL, HDL, LDLCALC, TRIG, CHOLHDL, LDLDIRECT in the last 72 hours.  Thyroid Function Tests: No results for input(s): TSH, T4TOTAL, FREET4, T3FREE, THYROIDAB in the last 72 hours.  Anemia Panel: Recent Labs    07/19/18 0534  VITAMINB12 687  FOLATE 9.7  FERRITIN 12*  TIBC 153*  IRON 101  RETICCTPCT 5.4*    Urine analysis: No results found for: COLORURINE, APPEARANCEUR, LABSPEC, PHURINE, GLUCOSEU, HGBUR, BILIRUBINUR, KETONESUR, PROTEINUR, UROBILINOGEN, NITRITE, LEUKOCYTESUR  Sepsis Labs: Lactic Acid, Venous No results found for: LATICACIDVEN  MICROBIOLOGY: No results found for this or any previous visit (from the past 240 hour(s)).  RADIOLOGY STUDIES/RESULTS: Ct Abdomen Pelvis W Contrast  Result Date: 07/06/2018 CLINICAL DATA:  Left lower quadrant pain EXAM: CT ABDOMEN AND PELVIS WITH CONTRAST TECHNIQUE: Multidetector CT imaging of the abdomen and pelvis was performed using the standard protocol following bolus  administration of intravenous contrast. CONTRAST:  11mL OMNIPAQUE IOHEXOL 300 MG/ML  SOLN COMPARISON:  None. FINDINGS: Lower chest: Small bilateral pleural effusions are noted right greater than left with mild bibasilar atelectatic changes. Hepatobiliary: Liver is well visualized and within normal limits. The gallbladder is well distended without cholelithiasis. Pancreas: Unremarkable. No pancreatic ductal dilatation or surrounding inflammatory changes. Spleen: Normal in size without focal abnormality. Adrenals/Urinary Tract: Adrenal glands are within normal limits. The kidneys demonstrate a normal enhancement pattern and normal excretion bilaterally. A small cortical cyst is noted within the right kidney measuring approximately 1 cm. The bladder is well distended. Stomach/Bowel: The distal colon appears within normal  limits. In the ascending colon there is a large colonic mass with changes highly suggestive of contained perforation. This measures approximately 9.9 x 9.9 cm in greatest AP and transverse dimensions. This extends along the margin of the second portion of the duodenum and duodenal wall thickening is noted suggestive of localized invasion. Mottled air fluid collection is noted adjacent to the column contrast which likely represents contained perforation. Free fluid is noted within the pelvis consistent with the perforation. The appendix is within normal limits. The jejunum and ileum are within normal limits with the exception of some wall thickening in the distal ileum related to the localized inflammatory change from the colonic mass. Vascular/Lymphatic: Aortic atherosclerosis. No enlarged abdominal or pelvic lymph nodes. Reproductive: Prostate is unremarkable. Other: Free fluid is noted within the pelvis as previously described. This also extends higher in the abdomen along the spleen and liver consistent with the findings in the ascending colon consistent with contained perforation. Musculoskeletal: No acute bony abnormality is seen. IMPRESSION: Large fungating mass in the ascending colon with changes highly suggestive of contained perforation and adjacent abscess formation. This extends along the margin of the second portion of the duodenum in the possibility of localized invasion of the mass deserves consideration. Surgical consultation is recommended. Free fluid within the abdomen consistent with the known perforated mass. No definitive extravasation of contrast material is noted at this time consistent with a more contained perforation. Bilateral pleural effusions with mild bibasilar atelectasis. Electronically Signed   By: Inez Catalina M.D.   On: 07/06/2018 17:55     LOS: 0 days   Oren Binet, MD  [facility]  If 7PM-7AM, please contact night-coverage  Please page via [email or web address]  Go  to [email or web address] and use [place]'s universal password to access. If you do not have the password, please contact the hospital operator.  Locate the [facility] provider you are looking for under [facility] and page to a number that you can be directly reached. If you still have difficulty reaching the provider, please page the [facility] (Director on Call) for the Hospitalists listed on amion for assistance.  07/19/2018, 11:19 AM

## 2018-07-19 NOTE — Progress Notes (Signed)
VASCULAR LAB PRELIMINARY  PRELIMINARY  PRELIMINARY  PRELIMINARY  Left upper extremity venous dulex completed.    Preliminary report:  See CV proc for preliminary results.   Deserae Jennings, RVT 07/19/2018, 4:44 PM

## 2018-07-20 ENCOUNTER — Inpatient Hospital Stay (HOSPITAL_COMMUNITY): Payer: Medicaid Other

## 2018-07-20 DIAGNOSIS — I639 Cerebral infarction, unspecified: Secondary | ICD-10-CM

## 2018-07-20 LAB — CBC
HCT: 23.6 % — ABNORMAL LOW (ref 39.0–52.0)
Hemoglobin: 7.6 g/dL — ABNORMAL LOW (ref 13.0–17.0)
MCH: 26.3 pg (ref 26.0–34.0)
MCHC: 32.2 g/dL (ref 30.0–36.0)
MCV: 81.7 fL (ref 80.0–100.0)
Platelets: 334 10*3/uL (ref 150–400)
RBC: 2.89 MIL/uL — ABNORMAL LOW (ref 4.22–5.81)
RDW: 17.8 % — ABNORMAL HIGH (ref 11.5–15.5)
WBC: 25 10*3/uL — ABNORMAL HIGH (ref 4.0–10.5)
nRBC: 0 % (ref 0.0–0.2)

## 2018-07-20 LAB — COMPREHENSIVE METABOLIC PANEL
ALT: 9 U/L (ref 0–44)
AST: 10 U/L — ABNORMAL LOW (ref 15–41)
Albumin: 1.3 g/dL — ABNORMAL LOW (ref 3.5–5.0)
Alkaline Phosphatase: 74 U/L (ref 38–126)
Anion gap: 6 (ref 5–15)
BUN: 12 mg/dL (ref 6–20)
CO2: 20 mmol/L — ABNORMAL LOW (ref 22–32)
Calcium: 7.2 mg/dL — ABNORMAL LOW (ref 8.9–10.3)
Chloride: 106 mmol/L (ref 98–111)
Creatinine, Ser: 0.54 mg/dL — ABNORMAL LOW (ref 0.61–1.24)
GFR calc Af Amer: >60 mL/min (ref >60)
GFR calc non Af Amer: >60 mL/min (ref >60)
Glucose, Bld: 85 mg/dL (ref 70–99)
Potassium: 3.9 mmol/L (ref 3.5–5.1)
Sodium: 132 mmol/L — ABNORMAL LOW (ref 135–145)
Total Bilirubin: 1.3 mg/dL — ABNORMAL HIGH (ref 0.3–1.2)
Total Protein: 5 g/dL — ABNORMAL LOW (ref 6.5–8.1)

## 2018-07-20 LAB — GLUCOSE, CAPILLARY: Glucose-Capillary: 84 mg/dL (ref 70–99)

## 2018-07-20 LAB — MAGNESIUM: Magnesium: 1.9 mg/dL (ref 1.7–2.4)

## 2018-07-20 LAB — PHOSPHORUS: Phosphorus: 2.6 mg/dL (ref 2.5–4.6)

## 2018-07-20 LAB — PREALBUMIN: Prealbumin: <5 mg/dL — ABNORMAL LOW (ref 18–38)

## 2018-07-20 MED ORDER — GADOBUTROL 1 MMOL/ML IV SOLN
5.0000 mL | Freq: Once | INTRAVENOUS | Status: AC | PRN
  Administered 2018-07-20: 5 mL via INTRAVENOUS

## 2018-07-20 MED ORDER — VITAMIN B-1 100 MG PO TABS
500.0000 mg | ORAL_TABLET | Freq: Three times a day (TID) | ORAL | Status: AC
  Administered 2018-07-20 – 2018-07-23 (×8): 500 mg via ORAL
  Filled 2018-07-20 (×8): qty 5

## 2018-07-20 MED ORDER — IOHEXOL 300 MG/ML  SOLN
100.0000 mL | Freq: Once | INTRAMUSCULAR | Status: AC | PRN
  Administered 2018-07-20: 100 mL via INTRAVENOUS

## 2018-07-20 MED ORDER — ENSURE ENLIVE PO LIQD
237.0000 mL | Freq: Three times a day (TID) | ORAL | Status: DC
  Administered 2018-07-20 – 2018-07-21 (×2): 237 mL via ORAL

## 2018-07-20 MED ORDER — VITAMIN B-1 100 MG PO TABS
100.0000 mg | ORAL_TABLET | Freq: Every day | ORAL | Status: AC
  Administered 2018-07-24 – 2018-07-30 (×7): 100 mg via ORAL
  Filled 2018-07-20 (×7): qty 1

## 2018-07-20 NOTE — Progress Notes (Signed)
PT refusing CT scan until he eats. I tried to explain that his food was on the way and he could eat after CT scan but pt became irritable and told us to get out of his room. I attempted to say he'd be back in his room by the time his food got here, pt not receptive. I called CT scan to update them about refusal.

## 2018-07-20 NOTE — Progress Notes (Addendum)
Patient is known to this CSW. [facility] home health liaison updated that patient is back in the hospital. Per [facility], due to patient noncompliance, they do not feel patient is appropriate for home health and recommend palliative or hospice. CSW will continue to follow patient.   Percell Locus Britiny Defrain LCSW [phone]

## 2018-07-20 NOTE — Progress Notes (Signed)
PROGRESS NOTE    Brandon Landry  OFH:[id number] DOB: 1965/12/13 DOA: 07/19/2018 PCP: Brandon Landry, No Pcp Per   Brief Narrative:  Per HPI: Brandon Landry is a 53 y.o. Landry who was hospitalized from 5/4 to 5/6 after he was found to have severe microcytic anemia with a hemoglobin of 3.1, he was transfused PRBC, CT scan of the abdomen on 5/4 showed a large fungating mass in the ascending colon with contained perforation and adjacent abscess formation, general surgery was subsequently consulted-surgical intervention was contemplated-however Brandon Landry refused and was subsequently discharged home on 5/6.  He returned [facility] on 5/16 with generalized weakness, and was found to have a hemoglobin of 3.2.  He was transfused 2 units of PRBC, and transferred to [facility].  He now wants to pursue surgical options.    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  Assessment & Plan:   Principal Problem:   Symptomatic anemia Active Problems:   Iron deficiency anemia due to chronic blood loss   Protein-calorie malnutrition, severe (HCC)   Lower extremity edema   Tobacco use disorder   Hypokalemia   Giant mass of hepatic flexure of colon - probable perforated colon cancer   Left arm weakness   Left arm swelling   Palliative care encounter   Severe microcytic anemia: Secondary to chronic blood loss and acute illness.  No overt bleeding noted.  Hemoglobin stable after 2 units of PRBC at [facility].  Continue supportive care, recheck CBC tomorrow.  Fungating mass in the ascending colon with probable perforation and adjacent abscess: Abdominal exam appears benign-discussed with general surgery-plans are to repeat CT of the abdomen and chest.  Discussed with General Surgery PA and will start regular diet after SLP evaluation. Prealbumin pending. May require TPN.  Left arm weakness:  Appears to have isolated left arm weakness-this is in a background of severe weakness and deconditioning-Brain MRI with embolic CVA. Questionable source of thrombus in aorta with further imaging to rule out dissection pending.  Left arm swelling/lower extremity swelling: Suspect this is secondary to hypoalbuminemia-doppler studies negative for DVT.  Hypokalemia:Repleted. Will check AM levels along with Mg.  Severe malnutrition: Albumin of 1.5-we will consult nutrition services. Prealbumin pending  Ethics/palliative care: Unfortunate 53 year old Brandon Landry with what appears to be advanced colon cancer-he appears very frail and debilitated/deconditioned.  Not sure if surgery will be curative-prudent to start involving palliative care-as I suspect his prognosis is not that good.   DVT prophylaxis:SCDs Code Status: Full Family Communication: None at bedside; used interpreter with Brandon Landry Disposition Plan: Further chest imaging pending; per Neuro, Palliative, and Gen Surg. Very poor prognosis.   Consultants:   General Surgery  Palliative  Neurology  Procedures:   None  Antimicrobials:  Anti-infectives (From admission, onward)   Start     Dose/Rate Route Frequency Ordered Stop   07/19/18 1400  piperacillin-tazobactam (ZOSYN) IVPB 3.375 g     3.375 g 12.5 mL/hr over 240 Minutes Intravenous Every 8 hours 07/19/18 0651     07/19/18 0600  piperacillin-tazobactam (ZOSYN) IVPB 3.375 g  Status:  Discontinued     3.375 g 12.5 mL/hr over 240 Minutes Intravenous Every 8 hours 07/19/18 0442 07/19/18 0447   07/19/18 0500  piperacillin-tazobactam (ZOSYN) IVPB 3.375 g     3.375 g 100 mL/hr over 30 Minutes Intravenous  Once 07/19/18 0447 07/19/18 1445        Subjective: Brandon Landry  seen and evaluated today with no new acute complaints or concerns. No acute concerns or events noted overnight. He is very irritated and hungry. He is asking for food.  Objective: Vitals:   07/19/18 2159  07/20/18 0129 07/20/18 0500 07/20/18 0512  BP: 97/68 105/83  98/76  Pulse: 82 83  85  Resp: 18 18  18   Temp: (!) 97.5 F (36.4 C) 97.7 F (36.5 C)  (!) 97.5 F (36.4 C)  TempSrc: Oral Oral  Oral  SpO2: 100%   100%  Weight:   51.5 kg     Intake/Output Summary (Last 24 hours) at 07/20/2018 1402 Last data filed at 07/20/2018 0400 Gross per 24 hour  Intake 578.82 ml  Output 700 ml  Net -121.18 ml   Filed Weights   07/19/18 0700 07/20/18 0500  Weight: 52.3 kg 51.5 kg    Examination:  General exam: Appears calm and comfortable , cachectic appearing Respiratory system: Clear to auscultation. Respiratory effort normal. Cardiovascular system: S1 & S2 heard, RRR. No JVD, murmurs, rubs, gallops or clicks. No pedal edema. Gastrointestinal system: Abdomen is nondistended, soft and nontender. No organomegaly, but mass felt. Normal bowel sounds heard. Central nervous system: Alert and oriented. No focal neurological deficits. Extremities: Symmetric 5 x 5 power. Skin: No rashes, lesions or ulcers Psychiatry: Anxious and irritable.    Data Reviewed: I have personally reviewed following labs and imaging studies  CBC: Recent Labs  Lab 07/19/18 0534 07/19/18 0953 07/20/18 0337  WBC 26.4* 26.2* 25.0*  NEUTROABS  --  22.7*  --   HGB 8.1* 7.6* 7.6*  HCT 25.2* 23.7* 23.6*  MCV 83.2 82.6 81.7  PLT 372 341 096   Basic Metabolic Panel: Recent Labs  Lab 07/19/18 0534 07/20/18 0337  NA 135 132*  K 3.2* 3.9  CL 104 106  CO2 19* 20*  GLUCOSE 84 85  BUN 17 12  CREATININE 0.50* 0.54*  CALCIUM 7.4* 7.2*  MG 2.0 1.9  PHOS  --  2.6   GFR: Estimated Creatinine Clearance: 77.8 mL/min (A) (by C-G formula based on SCr of 0.54 mg/dL (L)). Liver Function Tests: Recent Labs  Lab 07/19/18 0534 07/20/18 0337  AST 13* 10*  ALT 11 9  ALKPHOS 83 74  BILITOT 1.7* 1.3*  PROT 5.1* 5.0*  ALBUMIN 1.5* 1.3*   No results for input(s): LIPASE, AMYLASE in the last 168 hours. No results for  input(s): AMMONIA in the last 168 hours. Coagulation Profile: Recent Labs  Lab 07/19/18 0534  INR 1.3*   Cardiac Enzymes: No results for input(s): CKTOTAL, CKMB, CKMBINDEX, TROPONINI in the last 168 hours. BNP (last 3 results) No results for input(s): PROBNP in the last 8760 hours. HbA1C: No results for input(s): HGBA1C in the last 72 hours. CBG: Recent Labs  Lab 07/19/18 0755 07/20/18 0800  GLUCAP 85 84   Lipid Profile: No results for input(s): CHOL, HDL, LDLCALC, TRIG, CHOLHDL, LDLDIRECT in the last 72 hours. Thyroid Function Tests: No results for input(s): TSH, T4TOTAL, FREET4, T3FREE, THYROIDAB in the last 72 hours. Anemia Panel: Recent Labs    07/19/18 0534  VITAMINB12 687  FOLATE 9.7  FERRITIN 12*  TIBC 153*  IRON 101  RETICCTPCT 5.4*   Sepsis Labs: No results for input(s): PROCALCITON, LATICACIDVEN in the last 168 hours.  Recent Results (from the past 240 hour(s))  Culture, blood (Routine X 2) w Reflex to ID Panel     Status: None (Preliminary result)   Collection Time: 07/19/18  5:34 AM  Result Value Ref Range Status   Specimen Description BLOOD RIGHT ARM  Final   Special Requests   Final    BOTTLES DRAWN AEROBIC ONLY Blood Culture adequate volume   Culture   Final    NO GROWTH 1 DAY Performed at [facility], [address] [place], [place] [address]    Report Status PENDING  Incomplete  Culture, blood (Routine X 2) w Reflex to ID Panel     Status: None (Preliminary result)   Collection Time: 07/19/18  5:34 AM  Result Value Ref Range Status   Specimen Description BLOOD LEFT ANTECUBITAL  Final   Special Requests   Final    BOTTLES DRAWN AEROBIC ONLY Blood Culture results may not be optimal due to an inadequate volume of blood received in culture bottles   Culture   Final    NO GROWTH 1 DAY Performed at [facility], [place] [address] [place], [place] [address]    Report Status PENDING  Incomplete         Radiology Studies: Ct  Chest W Contrast  Result Date: 07/20/2018 CLINICAL DATA:  Colon cancer. EXAM: CT CHEST, ABDOMEN, AND PELVIS WITH CONTRAST TECHNIQUE: Multidetector CT imaging of the chest, abdomen and pelvis was performed following the standard protocol during bolus administration of intravenous contrast. CONTRAST:  12mL OMNIPAQUE IOHEXOL 300 MG/ML  SOLN COMPARISON:  07/06/2018 FINDINGS: CT CHEST FINDINGS Cardiovascular: The heart size is normal. Calcification in the LAD coronary artery. There is a 2.5 cm low-attenuation filling defect arising from the right lateral wall of the ascending thoracic aorta concerning for thrombus. Mediastinum/Nodes: Normal appearance of the thyroid gland. The trachea appears patent and is midline. Normal appearance of the esophagus. No mediastinal or hilar adenopathy. No axillary or supraclavicular adenopathy. Lungs/Pleura: Small bilateral pleural effusions, similar. Moderate changes of centrilobular and paraseptal emphysema. Scar like density identified in the right apex. No suspicious pulmonary nodule or mass identified. Musculoskeletal: No aggressive lytic or sclerotic bone lesions. CT ABDOMEN PELVIS FINDINGS Hepatobiliary: Low-density structure in lateral segment of left lobe of liver is unchanged measuring 9 mm, image 63/3. No new liver abnormality identified. Pancreas: Unremarkable. No pancreatic ductal dilatation or surrounding inflammatory changes. Spleen: Normal in size. Small peripheral wedge-shaped areas of low attenuation concerning for splenic infarcts. Adrenals/Urinary Tract: Normal appearance of the adrenal glands. There are several small right kidney lesions, too small to reliably characterize. No hydronephrosis bilaterally. Urinary bladder is negative. Stomach/Bowel: The stomach is nondistended. The proximal small bowel loops have a normal caliber. Abnormal bowel wall edema involving the terminal ileum is identified, similar to previous exam. Large necrotic mass within the right  abdomen centered around the ascending colon is again noted measuring 10.2 by 8.6 by 10.1 cm (volume = 460 cm^3). Previously this measured 9.9 x 9.9 by 9.6 cm (volume = 490 cm^3). As noted previously tumor appears to involve the second and third portions of the duodenum. Distal colon unremarkable. Vascular/Lymphatic: Aortic atherosclerosis without aneurysm. No abdominopelvic adenopathy. Reproductive: Prostate is unremarkable. Other: Diffuse abdominopelvic ascites is identified. Musculoskeletal: No acute or significant osseous findings. IMPRESSION: 1. Similar size of large fungating mass within the right hemiabdomen centered around the ascending colon. Invasion of the second and third portions of the duodenum noted. 2. Ascites. 3. Small bilateral pleural effusions. Mural filling defect along the right lateral wall of the ascending thoracic aorta is concerning for thrombus, image 44/6. 4. Small splenic infarcts. Electronically Signed   By: Queen Slough.D.  On: 07/20/2018 09:12   Mr Jeri Cos ZW Contrast  Result Date: 07/20/2018 CLINICAL DATA:  53 y/o M; left arm weakness. Possible colon cancer. EXAM: MRI HEAD WITHOUT AND WITH CONTRAST TECHNIQUE: Multiplanar, multiecho pulse sequences of the brain and surrounding structures were obtained without and with intravenous contrast. CONTRAST:  5 cc Gadavist. COMPARISON:  None. FINDINGS: Brain: Numerous punctate foci of reduced diffusion are present throughout the right greater than left posterior frontal lobes, bilateral parietal lobes, right posterior temporal lobe, bilateral occipital lobes, and the right cerebellar hemisphere. Foci of reduced diffusion demonstrate T2 FLAIR hyperintensity. No hemorrhage. After administration of intravenous contrast there is no abnormal enhancement. No extra-axial collection, hydrocephalus, mass effect, or herniation. Vascular: Normal flow voids. Skull and upper cervical spine: Normal marrow signal. Sinuses/Orbits: Negative. Other:  None. IMPRESSION: 1. Numerous punctate foci of acute/early subacute infarction are present throughout the right greater than left posterior cerebral hemispheres and the right cerebellar hemisphere. Multiple vascular territories suggests embolic etiology. No hemorrhage or mass effect. 2. No enhancing intracranial metastasis identified. These results will be called to the ordering clinician or representative by the Radiologist Assistant, and communication documented in the PACS or zVision Dashboard. Electronically Signed   By: Kristine Garbe M.D.   On: 07/20/2018 01:18   Ct Abdomen Pelvis W Contrast  Result Date: 07/20/2018 CLINICAL DATA:  Colon cancer. EXAM: CT CHEST, ABDOMEN, AND PELVIS WITH CONTRAST TECHNIQUE: Multidetector CT imaging of the chest, abdomen and pelvis was performed following the standard protocol during bolus administration of intravenous contrast. CONTRAST:  149mL OMNIPAQUE IOHEXOL 300 MG/ML  SOLN COMPARISON:  07/06/2018 FINDINGS: CT CHEST FINDINGS Cardiovascular: The heart size is normal. Calcification in the LAD coronary artery. There is a 2.5 cm low-attenuation filling defect arising from the right lateral wall of the ascending thoracic aorta concerning for thrombus. Mediastinum/Nodes: Normal appearance of the thyroid gland. The trachea appears patent and is midline. Normal appearance of the esophagus. No mediastinal or hilar adenopathy. No axillary or supraclavicular adenopathy. Lungs/Pleura: Small bilateral pleural effusions, similar. Moderate changes of centrilobular and paraseptal emphysema. Scar like density identified in the right apex. No suspicious pulmonary nodule or mass identified. Musculoskeletal: No aggressive lytic or sclerotic bone lesions. CT ABDOMEN PELVIS FINDINGS Hepatobiliary: Low-density structure in lateral segment of left lobe of liver is unchanged measuring 9 mm, image 63/3. No new liver abnormality identified. Pancreas: Unremarkable. No pancreatic ductal  dilatation or surrounding inflammatory changes. Spleen: Normal in size. Small peripheral wedge-shaped areas of low attenuation concerning for splenic infarcts. Adrenals/Urinary Tract: Normal appearance of the adrenal glands. There are several small right kidney lesions, too small to reliably characterize. No hydronephrosis bilaterally. Urinary bladder is negative. Stomach/Bowel: The stomach is nondistended. The proximal small bowel loops have a normal caliber. Abnormal bowel wall edema involving the terminal ileum is identified, similar to previous exam. Large necrotic mass within the right abdomen centered around the ascending colon is again noted measuring 10.2 by 8.6 by 10.1 cm (volume = 460 cm^3). Previously this measured 9.9 x 9.9 by 9.6 cm (volume = 490 cm^3). As noted previously tumor appears to involve the second and third portions of the duodenum. Distal colon unremarkable. Vascular/Lymphatic: Aortic atherosclerosis without aneurysm. No abdominopelvic adenopathy. Reproductive: Prostate is unremarkable. Other: Diffuse abdominopelvic ascites is identified. Musculoskeletal: No acute or significant osseous findings. IMPRESSION: 1. Similar size of large fungating mass within the right hemiabdomen centered around the ascending colon. Invasion of the second and third portions of the duodenum noted. 2. Ascites. 3.  Small bilateral pleural effusions. Mural filling defect along the right lateral wall of the ascending thoracic aorta is concerning for thrombus, image 44/6. 4. Small splenic infarcts. Electronically Signed   By: Kerby Moors M.D.   On: 07/20/2018 09:12   Vas [place] Upper Extremity Venous Duplex  Result Date: 07/20/2018 UPPER VENOUS STUDY  Indications: Swelling Comparison Study: No prior study on file for comparison Performing Technologist: Sharion Dove RVS  Examination Guidelines: A complete evaluation includes B-mode imaging, spectral Doppler, color Doppler, and power Doppler as needed of all  accessible portions of each vessel. Bilateral testing is considered an integral part of a complete examination. Limited examinations for reoccurring indications may be performed as noted.  Right Findings: +----------+------------+---------+-----------+----------+-------+  RIGHT      Compressible Phasicity Spontaneous Properties Summary  +----------+------------+---------+-----------+----------+-------+  Subclavian                 Yes        Yes                         +----------+------------+---------+-----------+----------+-------+  Left Findings: +----------+------------+---------+-----------+----------+-------+  LEFT       Compressible Phasicity Spontaneous Properties Summary  +----------+------------+---------+-----------+----------+-------+  IJV                        Yes        Yes                         +----------+------------+---------+-----------+----------+-------+  Subclavian     Full        Yes        Yes                         +----------+------------+---------+-----------+----------+-------+  Axillary       Full        Yes        Yes                         +----------+------------+---------+-----------+----------+-------+  Brachial       Full        Yes        Yes                         +----------+------------+---------+-----------+----------+-------+  Radial         Full                                               +----------+------------+---------+-----------+----------+-------+  Ulnar          Full                                               +----------+------------+---------+-----------+----------+-------+  Cephalic     Partial                                     Chronic  +----------+------------+---------+-----------+----------+-------+  Basilic        Full                                               +----------+------------+---------+-----------+----------+-------+  Summary:  Right: No evidence of thrombosis in the subclavian.  Left: No evidence of deep vein thrombosis in the upper  extremity. 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 Chronic partial thrombus noted in a small portion of the cephalic vein from [facility] to mid upper arm.  *See table(s) above for measurements and observations.  Diagnosing physician: Harold Barban MD Electronically signed by Harold Barban MD on 07/20/2018 at 9:11:19 AM.    Final         Scheduled Meds:  sodium chloride   Intravenous Once   multivitamin with minerals  1 tablet Oral Daily   nicotine  21 mg Transdermal Daily   Continuous Infusions:  sodium chloride Stopped (07/19/18 1445)   sodium chloride 50 mL/hr at 07/19/18 1720   piperacillin-tazobactam (ZOSYN)  IV 3.375 g (07/20/18 0511)     LOS: 1 day    Time spent: 30 minutes    Makia Bossi Darleen Crocker, DO [facility] Pager [phone]  If 7PM-7AM, please contact night-coverage [email or web address] Password [email or web address] 07/20/2018, 2:02 PM

## 2018-07-20 NOTE — Evaluation (Signed)
Clinical/Bedside Swallow Evaluation Patient Details  Name: Brandon Landry MRN: [id number] Date of Birth: 10/31/1965  Today's Date: 07/20/2018 Time: SLP Start Time (ACUTE ONLY): 1310 SLP Stop Time (ACUTE ONLY): 1330 SLP Time Calculation (min) (ACUTE ONLY): 20 min  Past Medical History:  Past Medical History:  Diagnosis Date  . Anemia   . Colonic mass   . Depression    Past Surgical History: No past surgical history on file. HPI:  Pt is a 53 year old male admitted with new CVA, MRI shows Numerous punctate foci of acute/early subacute infarction are present throughout the right greater than left posterior cerebral hemispheres and the right cerebellar hemisphere. Multiple vascular territories suggests embolic etiology. No hemorrhage or mass effect. Also found ot hvae neglected right colon mass with possible extension to involve the duodenum. Pt also with severe malnutrition.    Assessment / Plan / Recommendation Clinical Impression  Pt demonstrates adequate abilty to masticate and swallow, but is impulsive, grabs foods, talks while food is spilling from his mouth and overloads his mouth. Recommend resuming a regular texture diet though assist for set up and intermittent supervision is needed. Will sign off.  SLP Visit Diagnosis: Dysphagia, oral phase (R13.11)    Aspiration Risk  Mild aspiration risk    Diet Recommendation Regular;Thin liquid   Liquid Administration via: Cup;Straw Medication Administration: Whole meds with liquid Supervision: Patient able to self feed    Other  Recommendations     Follow up Recommendations Skilled Nursing facility      Frequency and Duration            Prognosis        Swallow Study   General HPI: Pt is a 53 year old male admitted with new CVA, MRI shows Numerous punctate foci of acute/early subacute infarction are present throughout the right greater than left posterior cerebral hemispheres and the right cerebellar hemisphere. Multiple vascular  territories suggests embolic etiology. No hemorrhage or mass effect. Also found ot hvae neglected right colon mass with possible extension to involve the duodenum. Pt also with severe malnutrition.  Type of Study: Bedside Swallow Evaluation Diet Prior to this Study: Thin liquids Temperature Spikes Noted: No Respiratory Status: Room air History of Recent Intubation: No Behavior/Cognition: Alert Oral Cavity - Dentition: Poor condition Self-Feeding Abilities: Able to feed self Patient Positioning: Upright in chair Baseline Vocal Quality: Normal Volitional Cough: Strong Volitional Swallow: Able to elicit    Oral/Motor/Sensory Function Overall Oral Motor/Sensory Function: Within functional limits   Ice Chips     Thin Liquid Thin Liquid: Within functional limits    Nectar Thick Nectar Thick Liquid: Not tested   Honey Thick Honey Thick Liquid: Not tested   Puree Puree: Not tested   Solid     Solid: Impaired Presentation: Self Fed Oral Phase Functional Implications: Left anterior spillage;Right anterior spillage;Oral residue     Herbie [place], MA CCC-SLP  Acute Rehabilitation Services Pager [phone] Office [phone]  Ayan Yankey, Katherene Ponto 07/20/2018,1:37 PM

## 2018-07-20 NOTE — Evaluation (Signed)
Physical Therapy Evaluation Patient Details Name: Brandon Landry MRN: [id number] DOB: 07-29-1965 Today's Date: 07/20/2018   History of Present Illness  Pt is a 53 y/o male admitted secondary to symptomatic anemia. CT of abdomen revealed right colon mass with possible extension to involve the duodenum. Pt also with LUE weakness and MRI revealed numerous punctate foci of acute/early subacute infarction present throughout the right greater than left posterior cerebral hemispheres and the right cerebellar hemisphere. PMH includes depression, iron deficiency anemia, and tobacco abuse.   Clinical Impression  Pt admitted secondary to problem above with deficits below. Notable LUE weakness and Bilateral feet swelling. Question possible cognitive deficits as well, however, unsure of pt's baseline. Requiring min to min guard to perform transfer to chair this session. Feel pt will require increased assist at home and is a high fall risk. Will continue to follow acutely to maximize functional mobility independence and safety.     Follow Up Recommendations SNF;Supervision/Assistance - 24 hour    Equipment Recommendations  Rolling walker with 5" wheels    Recommendations for Other Services OT consult     Precautions / Restrictions Precautions Precautions: Fall Restrictions Weight Bearing Restrictions: No      Mobility  Bed Mobility Overal bed mobility: Needs Assistance Bed Mobility: Supine to Sit     Supine to sit: Min assist;HOB elevated     General bed mobility comments: Min A for LE assist and trunk elevation. Increased time required and required cues for sequencing.   Transfers Overall transfer level: Needs assistance Equipment used: Rolling walker (2 wheeled) Transfers: Sit to/from [facility] Sit to Stand: Min assist;From elevated surface Stand pivot transfers: Min guard       General transfer comment: Min A from elevated surface. Cues for safe hand placement.  Required min guard A for transfer to chair. Cues for sequencing with use of RW. Pt's food in room, so further mobility limited.   Ambulation/Gait                Stairs            Wheelchair Mobility    Modified Rankin (Stroke Patients Only) Modified Rankin (Stroke Patients Only) Pre-Morbid Rankin Score: Slight disability Modified Rankin: Moderately severe disability     Balance Overall balance assessment: Needs assistance;History of Falls Sitting-balance support: No upper extremity supported;Feet supported Sitting balance-Leahy Scale: Good     Standing balance support: Bilateral upper extremity supported Standing balance-Leahy Scale: Poor Standing balance comment: reliant on RW for stability, supporting self with hips at sink during grooming tasks                              Pertinent Vitals/Pain Pain Assessment: Faces Faces Pain Scale: Hurts little more Pain Location: stomach Pain Descriptors / Indicators: Aching;Operative site guarding    Home Living Family/patient expects to be discharged to:: Private residence Living Arrangements: Alone   Type of Home: Apartment Home Access: Stairs to enter Entrance Stairs-Rails: Can reach both Entrance Stairs-Number of Steps: 15 Home Layout: One level Home Equipment: None      Prior Function Level of Independence: Needs assistance   Gait / Transfers Assistance Needed: per previous notes, pt with difficulty ambulating secondary to increased weakness.            Hand Dominance   Dominant Hand: Right    Extremity/Trunk Assessment   Upper Extremity Assessment Upper Extremity Assessment: LUE deficits/detail LUE Deficits /  Details: Decreased grip strength noted and generalized waekness throughout LUE.     Lower Extremity Assessment Lower Extremity Assessment: Generalized weakness    Cervical / Trunk Assessment Cervical / Trunk Assessment: Normal  Communication   Communication: Prefers  language other than English(Korean, however, speaks english fairly well )  Cognition Arousal/Alertness: Awake/alert Behavior During Therapy: WFL for tasks assessed/performed Overall Cognitive Status: Impaired/Different from baseline Area of Impairment: Safety/judgement;Awareness;Problem solving                         Safety/Judgement: Decreased awareness of safety;Decreased awareness of deficits Awareness: Emergent Problem Solving: Requires verbal cues;Slow processing General Comments: Pt unable to answer most home information questions and was easily distracted throughout session. Slowed processing noted and required cues for sequencing during mobility. Limited awareness of deficits and stating he wants food multiple times throughout session.       General Comments      Exercises     Assessment/Plan    PT Assessment Patient needs continued PT services  PT Problem List Decreased strength;Decreased mobility;Decreased safety awareness;Decreased activity tolerance;Decreased balance;Decreased knowledge of use of DME;Decreased cognition;Decreased knowledge of precautions;Decreased coordination       PT Treatment Interventions DME instruction;Functional mobility training;Balance training;Patient/family education;Gait training;Therapeutic activities;Neuromuscular re-education;Therapeutic exercise;Stair training    PT Goals (Current goals can be found in the Care Plan section)  Acute Rehab PT Goals Patient Stated Goal: to eat real food PT Goal Formulation: With patient Time For Goal Achievement: 08/03/18 Potential to Achieve Goals: Good    Frequency Min 3X/week   Barriers to discharge Decreased caregiver support      Co-evaluation               AM-PAC PT "6 Clicks" Mobility  Outcome Measure Help needed turning from your back to your side while in a flat bed without using bedrails?: A Little Help needed moving from lying on your back to sitting on the side of a  flat bed without using bedrails?: A Little Help needed moving to and from a bed to a chair (including a wheelchair)?: A Little Help needed standing up from a chair using your arms (e.g., wheelchair or bedside chair)?: A Little Help needed to walk in hospital room?: A Lot Help needed climbing 3-5 steps with a railing? : Total 6 Click Score: 15    End of Session Equipment Utilized During Treatment: Gait belt Activity Tolerance: Patient tolerated treatment well Patient left: in chair;with chair alarm set;with call bell/phone within reach Nurse Communication: Mobility status PT Visit Diagnosis: Other abnormalities of gait and mobility (R26.89);Unsteadiness on feet (R26.81);Muscle weakness (generalized) (M62.81)    Time: 4128-7867 PT Time Calculation (min) (ACUTE ONLY): 22 min   Charges:   PT Evaluation $PT Eval Moderate Complexity: [place], PT, DPT  Acute Rehabilitation Services  Pager: [phone] Office: [phone]   Rudean Hitt 07/20/2018, 12:14 PM

## 2018-07-20 NOTE — Progress Notes (Signed)
Pt started to drink contrast, education completed.

## 2018-07-20 NOTE — Progress Notes (Signed)
Pt went for MRI brain, results available in Epic, Schorr, NP notified.

## 2018-07-20 NOTE — Progress Notes (Signed)
Initial Nutrition Assessment  RD working remotely.  DOCUMENTATION CODES:   Underweight  INTERVENTION:   -MVI with minerals daily -Ensure Enlive po BID, each supplement provides 350 kcal and 20 grams of protein -If pt desires aggressive care, consider supplemental short term enteral nutrition support  NUTRITION DIAGNOSIS:   Increased nutrient needs related to chronic illness(colonic mass) as evidenced by estimated needs.  GOAL:   Patient will meet greater than or equal to 90% of their needs  MONITOR:   PO intake, Supplement acceptance, Labs, Weight trends, Skin, I & O's  REASON FOR ASSESSMENT:   Consult, Malnutrition Screening Tool Assessment of nutrition requirement/status  ASSESSMENT:   Brandon Landry is a 53 y.o. male with medical history significant of depression, iron deficiency anemia, tobacco abuse, who presents with generalized weakness.  Pt admitted with symptomatic anemia.   5/18- s/p BSE_ advanced to regular diet with thin liquids  Pt with large fungating mass in the ascending colon; refused surgical intervention during last admission.  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 Nutritional status would also need to be optimized prior to surgery. This RD would recommend supplemental enteral nutrition support prior to considering TPN initiation.  Pt with a 2.7% wt gain over the past 2 weeks (since last hospitalization), which is favorable given history of malnutrition.   Palliative care has been consulted to discuss goals of care. APS worker is willing to assist with this meeting.   Labs reviewed: Na: 132, CBGS: 84.   Diet Order:   Diet Order            Diet regular Room service appropriate? Yes with Assist; Fluid consistency: Thin  Diet effective now              EDUCATION NEEDS:   No education needs have  been identified at this time  Skin:  Skin Assessment: Reviewed RN Assessment  Last BM:  Unknown  Height:   Ht Readings from Last 1 Encounters:  07/06/18 5' 8.11" (1.73 m)    Weight:   Wt Readings from Last 1 Encounters:  07/20/18 51.5 kg    Ideal Body Weight:  70 kg  BMI:  Body mass index is 17.21 kg/m.  Estimated Nutritional Needs:   Kcal:  1850-2050  Protein:  105-120 grams  Fluid:  > 1.8 L    Brandon Landry A. Brandon Landry, RD, LDN, Gerty Registered Dietitian II Certified Diabetes Care and Education Specialist Pager: [phone] After hours Pager: [phone]

## 2018-07-20 NOTE — Progress Notes (Signed)
Daily Progress Note   Patient Name: Brandon Landry       Date: 07/20/2018 DOB: 1965-04-27  Age: 53 y.o. MRN#: [id number] Attending Physician: Rodena Goldmann, DO Primary Care Physician: Patient, No Pcp Per Admit Date: 07/19/2018  Reason for Consultation/Follow-up: Establishing goals of care  Reviewed Epic notes.  Met at bedside with patient and interpreter [name]  Subjective: Patient is emotionally labile.  He expresses that he is scared.  He feels the hospital may be trying to take his money.  He states that he is afraid of what is being put in his IV.  He describes something in the ceiling that is keeping his cell phone from working.    Several people come into the room to care for him while the interpreter and I are present.  This is frustrating to him "1 thing at a time!"  He seems overwhelmed.  At one point he yells out "fuck you".  Later he cries apologizing and asking for help.  When I ask about family contacts he says "Come here Baby and I'll show you" while smiling.  From the patient and his divorce attorney Brandon Landry [phone]) who happened to be on the telephone, I learn that Mr. Landry lives alone in [place], [place].  Multiple calls to APS have been made about him.  His APS worker is [employer] Landry [phone].    Per BJ he has recently gone thru an "ugly divorce".  His ex-wife and two children live in [place].  His son is 17 and his daughter is 59.    Fitzgerald requested that he be sent home to [place] to be with is parents.  He reportedly comes from wealth and his family has resources to pay for his medical care.  Later I learned from his nephew that the patient's father is deceased, but his mother is alive and does have resources.  I asked repeatedly for his mother's name or how to contact  her.  He did not give me this information.  I was finally able to get a phone number for his sister in [place] and his younger sister in [place].  After meeting with Mr. Bonzo I spoke at length with his APS case worker [name].  [name] explained that Mr. Brandon Landry was living in [place] when he met him in April.  He has arranged for him to have food delivered from the soup kitchen daily.  After his last discharge from the hospital Mr Abee went home for a bit but did not do well.  Brandon arranged for him to stay at [facility] in [place].   This lasted a day until Mr. Skog wanted to smoke a cigarette and then he left.  [name] offered to come to the hospital to help with goals of care discussion on 5/19 at 10:00     Assessment: Scared, paranoid, overwhelmed.  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   Patient Profile/HPI:  53 y.o. [place] male who speaks little english with past medical history of 9.9 x 9.9 cm fungating colonic mass with possible abscess and contained perforation that was originally imaged on 07/06/2018, severe malnutrition, and tobacco use who was transferred from [facility] on 07/19/2018 with severe anemia (Hgb of 3.2).  When the mass was first imaged the patient had presented with severe anemia as well.  During the admission from 5/4 - 5/6 he received blood transfusion and surgery consultation.  He refused surgery and was discharged with a Hgb of 7.7.  He returned to the ER this admission requesting surgery.    Length of Stay: 1  Current Medications: Scheduled Meds:  . sodium chloride   Intravenous Once  . multivitamin with minerals  1 tablet Oral Daily  . nicotine  21 mg Transdermal Daily  . [START ON 07/24/2018] thiamine  100 mg Oral Daily  . thiamine  500 mg Oral TID    Continuous Infusions: . sodium  chloride Stopped (07/19/18 1445)  . sodium chloride 50 mL/hr at 07/19/18 1720  . piperacillin-tazobactam (ZOSYN)  IV 3.375 g (07/20/18 0511)    PRN Meds: sodium chloride, acetaminophen **OR** acetaminophen, ondansetron **OR** ondansetron (ZOFRAN) IV  Physical Exam       2 of 6   Vital Signs: BP 102/83 (BP Location: Right Arm)   Pulse 94   Temp 97.6 F (36.4 C) (Axillary)   Resp 20   Wt 51.5 kg   SpO2 100%   BMI 17.21 kg/m  SpO2: SpO2: 100 % O2 Device: O2 Device: Room Air O2 Flow Rate:    Intake/output summary:   Intake/Output Summary (Last 24 hours) at 07/20/2018 1602 Last data filed at 07/20/2018 0400 Gross per 24 hour  Intake 578.82 ml  Output 700 ml  Net -121.18 ml   LBM: Last BM Date: 07/06/18 Baseline Weight: Weight: 52.3 kg Most recent weight: Weight: 51.5 kg       Palliative Assessment/Data:  50%    Flowsheet Rows     Most Recent Value  Intake Tab  Referral Department  Hospitalist  Unit at Time of Referral  Med/Surg Unit  Palliative Care Primary Diagnosis  Cancer  Date Notified  07/19/18  Palliative Care Type  New Palliative care  Reason for referral  Clarify Goals of Care  Date of Admission  07/19/18  Date first seen by Palliative Care  07/19/18  # of days Palliative referral response time  0 Day(s)  # of days IP prior to Palliative referral  0  Clinical Assessment  Psychosocial & Spiritual Assessment  Palliative Care Outcomes      Patient Active Problem List   Diagnosis Date Noted  . Left arm weakness 07/19/2018  . Left arm swelling   .  Palliative care encounter   . Non-English speaking patient Brandon Landry) 07/08/2018  . 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 Iron deficiency anemia due to chronic blood loss 07/06/2018  . Symptomatic anemia 07/06/2018  . Protein-calorie malnutrition, severe ([place]) 07/06/2018  . Chronic systolic CHF (congestive heart failure) ([place]) 07/06/2018  . Lower extremity  edema 07/06/2018  . Leukocytosis 07/06/2018  . Abdominal pain 07/06/2018  . Psychosocial stressors 07/06/2018  . Tobacco use disorder 07/06/2018  . Hypokalemia 07/06/2018  . Hypomagnesemia 07/06/2018    Palliative Care Plan    Recommendations/Plan:  Will continue to attempt [facility] discussion with his APS case worker Brandon Landry tomorrow morning at 10.  I have also reached out to his sister Cheri Fowler in [place] [phone] (Her son Isadore Palecek speaks English).  His other sister in [place] is [facility] [phone]  Goals of Care and Additional Recommendations:  Limitations on Scope of Treatment: Full Scope Treatment  Code Status:  Full code  Prognosis:   Without aggressive treatment and continued blood transfusions he has weeks to possibly 1 month.  Discharge Planning:  To Be Determined  Care plan was discussed with patient, PMT attending, [facility] attending, APS case worker, Nephew   Thank you for allowing the Palliative Medicine Team to assist in the care of this patient.  Total time spent:  180 min. Time in 2:00 Time out 5:00     Greater than 50%  of this time was spent counseling and coordinating care related to the above assessment and plan.  Florentina Jenny, PA-C Palliative Medicine  Please contact Palliative MedicineTeam phone at [phone] for questions and concerns between 7 am - 7 pm.   Please see AMION for individual provider pager numbers.

## 2018-07-20 NOTE — Evaluation (Signed)
Occupational Therapy Evaluation Patient Details Name: Brandon Landry MRN: [id number] DOB: 06-Dec-1965 Today's Date: 07/20/2018    History of Present Illness Pt is a 53 y/o male admitted secondary to symptomatic anemia. CT of abdomen revealed right colon mass with possible extension to involve the duodenum. Pt also with LUE weakness and MRI revealed numerous punctate foci of acute/early subacute infarction present throughout the right greater than left posterior cerebral hemispheres and the right cerebellar hemisphere. PMH includes depression, iron deficiency anemia, and tobacco abuse.    Clinical Impression   Pt lives alone and struggles to care for himself and mobilize. Pt seen with interpreter. Presents with paranoia, generalized weakness, L hand edema with L UE weaker than R. Pt required 2 person assist to stand from chair and transfer to bed with RW and for LEs back into bed. While he was able to place L UE on walker and grasp somewhat, he was unable to use as a functional stabilizer when setting up his lunch tray. Pt is a high fall risk and has a decreased awareness of deficits and safety. Recommending SNF. Will follow acutely.    Follow Up Recommendations  SNF;Supervision/Assistance - 24 hour    Equipment Recommendations  3 in 1 bedside commode    Recommendations for Other Services       Precautions / Restrictions Precautions Precautions: Fall Restrictions Weight Bearing Restrictions: No      Mobility Bed Mobility Overal bed mobility: Needs Assistance Bed Mobility: Sit to Supine       Sit to supine: Mod assist   General bed mobility comments: assist for LEs back into bed  Transfers Overall transfer level: Needs assistance Equipment used: Rolling walker (2 wheeled) Transfers: Sit to/from [employer] Transfers Sit to Stand: +2 physical assistance;Min assist Stand pivot transfers: +2 physical assistance;Min assist       General transfer comment: from recliner, cues  for hand placement, but pt did not follow, very unsteady    Balance Overall balance assessment: Needs assistance;History of Falls   Sitting balance-Leahy Scale: Good     Standing balance support: Bilateral upper extremity supported Standing balance-Leahy Scale: Poor Standing balance comment: reliant on RW and external support for stability                           ADL either performed or assessed with clinical judgement   ADL Overall ADL's : Needs assistance/impaired Eating/Feeding: Bed level;Minimal assistance Eating/Feeding Details (indicate cue type and reason): assist to open containers Grooming: Wash/dry face;Sitting;Minimal assistance Grooming Details (indicate cue type and reason): assist to wring out washcloth Upper Body Bathing: Moderate assistance;Sitting   Lower Body Bathing: Moderate assistance;Sit to/from stand   Upper Body Dressing : Sitting;Moderate assistance   Lower Body Dressing: Maximal assistance;Sit to/from stand   Toilet Transfer: +2 for physical assistance;Minimal assistance;RW   Toileting- Clothing Manipulation and Hygiene: Moderate assistance;Sit to/from stand               Vision Patient Visual Report: No change from baseline       Perception     Praxis      Pertinent Vitals/Pain Pain Assessment: Faces Faces Pain Scale: No hurt     Hand Dominance Right   Extremity/Trunk Assessment Upper Extremity Assessment Upper Extremity Assessment: LUE deficits/detail LUE Deficits / Details: edematous hand, generalized weakness, no functional use of hand LUE Coordination: decreased fine motor;decreased gross motor   Lower Extremity Assessment Lower Extremity Assessment: Defer to PT  evaluation   Cervical / Trunk Assessment Cervical / Trunk Assessment: Normal   Communication Communication Communication: Prefers language other than English;Interpreter utilized(Korean)   Cognition Arousal/Alertness: Awake/alert Behavior During  Therapy: Flat affect(paranoia) Overall Cognitive Status: Impaired/Different from baseline Area of Impairment: Safety/judgement;Awareness;Problem solving                         Safety/Judgement: Decreased awareness of safety;Decreased awareness of deficits Awareness: Emergent Problem Solving: Requires verbal cues;Slow processing General Comments: pt requesting OT leave when pt asked whether he could get his own legs into the bed   General Comments       Exercises     Shoulder Instructions      Home Living Family/patient expects to be discharged to:: Private residence Living Arrangements: Alone   Type of Home: Apartment Home Access: Stairs to enter Entrance Stairs-Number of Steps: 15 Entrance Stairs-Rails: Can reach both Home Layout: One level     Bathroom Shower/Tub: Teacher, early years/pre: Standard     Home Equipment: None          Prior Functioning/Environment Level of Independence: Needs assistance  Gait / Transfers Assistance Needed: per previous notes, pt with difficulty ambulating secondary to increased weakness.  ADL's / Homemaking Assistance Needed: per previous chart entry, pt was not able to perform ADL due to weakness            OT Problem List: Decreased strength;Decreased activity tolerance;Impaired balance (sitting and/or standing);Decreased cognition;Decreased safety awareness;Decreased knowledge of use of DME or AE;Decreased knowledge of precautions;Impaired UE functional use;Decreased coordination      OT Treatment/Interventions: Self-care/ADL training;Therapeutic exercise;DME and/or AE instruction;Therapeutic activities;Patient/family education;Balance training;Cognitive remediation/compensation    OT Goals(Current goals can be found in the care plan section) Acute Rehab OT Goals Patient Stated Goal: to return to bed  OT Goal Formulation: With patient Time For Goal Achievement: 08/03/18 Potential to Achieve Goals:  Fair ADL Goals Pt Will Perform Eating: Independently;sitting(incorporating L hand to stabilize) Pt Will Perform Grooming: with min guard assist;standing Pt Will Perform Upper Body Dressing: with set-up;with supervision;sitting Pt Will Perform Lower Body Dressing: with min assist;sit to/from stand Pt Will Transfer to Toilet: with min guard assist;ambulating;bedside commode Pt Will Perform Toileting - Clothing Manipulation and hygiene: with min assist;sit to/from stand Pt/caregiver will Perform Home Exercise Program: Left upper extremity;With minimal assist(AROM)  OT Frequency: Min 2X/week   Barriers to D/C: Decreased caregiver support          Co-evaluation              AM-PAC OT "6 Clicks" Daily Activity     Outcome Measure Help from another person eating meals?: A Little Help from another person taking care of personal grooming?: A Little Help from another person toileting, which includes using toliet, bedpan, or urinal?: A Lot Help from another person bathing (including washing, rinsing, drying)?: A Lot Help from another person to put on and taking off regular upper body clothing?: A Little Help from another person to put on and taking off regular lower body clothing?: A Lot 6 Click Score: 15   End of Session Equipment Utilized During Treatment: Gait belt;Rolling walker  Activity Tolerance: Patient tolerated treatment well Patient left: in bed;with call bell/phone within reach;with bed alarm set  OT Visit Diagnosis: Other abnormalities of gait and mobility (R26.89);Muscle weakness (generalized) (M62.81);Other symptoms and signs involving cognitive function;History of falling (Z91.81);Unsteadiness on feet (R26.81)  Time: 1436-1500 OT Time Calculation (min): 24 min Charges:  OT General Charges $OT Visit: 1 Visit OT Evaluation $OT Eval Moderate Complexity: 1 Mod OT Treatments $Self Care/Home Management : 8-22 mins  Nestor Lewandowsky, OTR/L Acute  Rehabilitation Services Pager: [phone] Office: [phone]  Malka So 07/20/2018, 3:47 PM

## 2018-07-20 NOTE — Consult Note (Addendum)
Neurology Consultation  Reason for Consult: Embolic strokes noted on MRI Referring Physician: Dr. Manuella Ghazi  CC: Weakness  History is obtained from: Chart and interpreter HPI: Brandon Landry is a 53 y.o. male patient was initially hospitalized for severe anemia on 5/4-5/6.  At that time he had a hemoglobin of 3.1 and was transfused he got his hemoglobin back up to 7.7.  Surgery was planning on doing a surgical intervention however patient refused at that time.  Patient returned to the hospital on 5/17 with again the complaint of generalized weakness.  Patient at that time was found to hemoglobin of 3.2 and a blood pressure of 88/46 which improved with fluids and 2 units of blood.  Although patient was complaining of generalized weakness he did make comment that his left arm felt weaker than the right arm.  For that reason MRI brain was obtained.  MRI revealed numerous punctate foci of acute/early subacute infarctions which are right greater than left cerebral hemispheres he also has a punctate infarct in the right cerebellar hemisphere.  No enhancing or intracranial metastasis were identified.  This was followed by a CT abdomen pelvis and chest.  Further evaluation of the MRI showed a thrombus of the thoracic aorta.  Neurology was consulted for further evaluation of this patient stroke  When attempting to get history from patient through interpreter it was very difficult.  Patient was cantankerous and was focusing on food and obtaining sheets for his bed.  It was very difficult to tweeze out any form of history.  I was able to get him to state it was 5 days ago that he noted that his left arm and left leg were weak.  At that point I could not get any more information from him.  LKW: Unknown tpa given?: no, no known last known normal Premorbid modified Rankin scale (mRS): 2 NIH stroke score: 10 however this could be skewed as patient was not taking full part in exam   ROS:  Unable to obtain due to altered  mental status.   Past Medical History:  Diagnosis Date  . Anemia   . Colonic mass   . Depression    Family History  Problem Relation Age of Onset  . Hypertension Neg Hx   . Diabetes Neg Hx   . Stroke Neg Hx   . Cancer Neg Hx    Social History:   reports that he has been smoking. He does not have any smokeless tobacco history on file. He reports that he does not drink alcohol or use drugs.  Medications  Current Facility-Administered Medications:  .  0.9 %  sodium chloride infusion (Manually program via Guardrails IV Fluids), , Intravenous, Once, Ivor Costa, MD, Stopped at 07/19/18 0600 .  0.9 %  sodium chloride infusion, , Intravenous, PRN, Jonetta Osgood, MD, Stopped at 07/19/18 1445 .  0.9 %  sodium chloride infusion, , Intravenous, Continuous, Ghimire, Henreitta Leber, MD, Last Rate: 50 mL/hr at 07/19/18 1720 .  acetaminophen (TYLENOL) tablet 650 mg, 650 mg, Oral, Q6H PRN, 650 mg at 07/19/18 1134 **OR** acetaminophen (TYLENOL) suppository 650 mg, 650 mg, Rectal, Q6H PRN, Ivor Costa, MD .  multivitamin with minerals tablet 1 tablet, 1 tablet, Oral, Daily, Ivor Costa, MD, 1 tablet at 07/20/18 1019 .  nicotine (NICODERM CQ - dosed in mg/24 hours) patch 21 mg, 21 mg, Transdermal, Daily, Ivor Costa, MD, 21 mg at 07/20/18 1017 .  ondansetron (ZOFRAN) tablet 4 mg, 4 mg, Oral, Q6H PRN **OR** ondansetron (  ZOFRAN) injection 4 mg, 4 mg, Intravenous, Q6H PRN, Ivor Costa, MD .  piperacillin-tazobactam (ZOSYN) IVPB 3.375 g, 3.375 g, Intravenous, Q8H, [name], RPH, Last Rate: 12.5 mL/hr at 07/20/18 0511, 3.375 g at 07/20/18 0511   Exam: Current vital signs: BP 98/76 (BP Location: Right Arm)   Pulse 85   Temp (!) 97.5 F (36.4 C) (Oral)   Resp 18   Wt 51.5 kg   SpO2 100%   BMI 17.21 kg/m  Vital signs in last 24 hours: Temp:  [97.5 F (36.4 C)-97.7 F (36.5 C)] 97.5 F (36.4 C) (05/18 0512) Pulse Rate:  [75-85] 85 (05/18 0512) Resp:  [18] 18 (05/18 0512) BP: (97-105)/(68-83)  98/76 (05/18 0512) SpO2:  [100 %] 100 % (05/18 0512) Weight:  [51.5 kg] 51.5 kg (05/18 0500)  Physical Exam  Constitutional: Cachectic Psych: Affect appropriate to situation Eyes: No scleral injection HENT: No OP obstrucion Head: Normocephalic.  Cardiovascular: Normal rate and regular rhythm.  Respiratory: Effort normal, non-labored breathing GI: Soft.  No distension. There is no tenderness.  Skin: WDI  Neuro:--This is difficult as patient did not want to take significant part in exam Mental Status: He does know he is in the hospital.  Patient is alert, he is complaining of right hip pain, he is focusing on that he is hungry and wants specific food.  He is unable to move his left arm or leg and does make comments and is a knowledgeable about that.   Cranial Nerves: II: Able to count fingers III,IV, VI: EOMI without ptosis or diploplia. Pupils equal, round and reactive to light V: Facial sensation is symmetric to temperature VII: Facial movement is symmetric.  VIII: hearing is intact to voice X: Palat elevates symmetrically XII: tongue is midline without atrophy or fasciculations.  Motor: Tone is decreased throughout. Bulk is decreased throughout.  Patient has the left upper extremity of 0/5, left lower extremity he is able to lift it with hip flexion his foot approximately 1/2 inch off the floor other than that he has 1/5 strength throughout along with as stated significant decreased tone and bulk throughout his right side shows 5/5 strength  sensory: No complaints on left and right sensation Deep Tendon Reflexes: 1+ in the upper extremities with no knee jerk or patella Plantars: Downgoing bilaterally   Labs I have reviewed labs in epic and the results pertinent to this consultation are:   CBC    Component Value Date/Time   WBC 25.0 (H) 07/20/2018 0337   RBC 2.89 (L) 07/20/2018 0337   HGB 7.6 (L) 07/20/2018 0337   HCT 23.6 (L) 07/20/2018 0337   PLT 334 07/20/2018 0337    MCV 81.7 07/20/2018 0337   MCH 26.3 07/20/2018 0337   MCHC 32.2 07/20/2018 0337   RDW 17.8 (H) 07/20/2018 0337   LYMPHSABS 1.2 07/19/2018 0953   MONOABS 2.0 (H) 07/19/2018 0953   EOSABS 0.1 07/19/2018 0953   BASOSABS 0.0 07/19/2018 0953    CMP     Component Value Date/Time   NA 132 (L) 07/20/2018 0337   K 3.9 07/20/2018 0337   CL 106 07/20/2018 0337   CO2 20 (L) 07/20/2018 0337   GLUCOSE 85 07/20/2018 0337   BUN 12 07/20/2018 0337   CREATININE 0.54 (L) 07/20/2018 0337   CALCIUM 7.2 (L) 07/20/2018 0337   PROT 5.0 (L) 07/20/2018 0337   ALBUMIN 1.3 (L) 07/20/2018 0337   AST 10 (L) 07/20/2018 0337   ALT 9 07/20/2018 0337   ALKPHOS  74 07/20/2018 0337   BILITOT 1.3 (H) 07/20/2018 0337   GFRNONAA >60 07/20/2018 0337   GFRAA >60 07/20/2018 5809    Lipid Panel  No results found for: CHOL, TRIG, HDL, CHOLHDL, VLDL, LDLCALC, LDLDIRECT   Imaging I have reviewed the images obtained:  Echocardiogram.:  Left ventricular has mild-moderate reduced systolic function with an ejection fraction of 40-45.  There is akinesis of the apical inferior lateral, inferior and anterior walls as well as the apex  MRI examination of the brain-Numerous punctate foci of acute/early subacute infarction are present throughout the right greater than left posterior cerebral hemispheres and the right cerebellar hemisphere. Multiple vascular territories suggests embolic etiology. No hemorrhage or mass effect. 2. No enhancing intracranial metastasis identified.  Etta Quill PA-C [facility] [phone]  M-F  (9:00 am- 5:00 PM)  07/20/2018, 12:31 PM     Assessment:  This is a 53 year old male presented to the hospital initially with anemia and generalized weakness.  Due to noted left arm weakness compared to right arm MRI was obtained which did show scattered emboli which looks cardioembolic.  It was also noticed that he has a thrombus of the thoracic aorta.  However it is unclear at this  time if this is a thrombus or possibly dissection.  For that reason we need to further evaluate with a CT of chest and aorta.  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  However this was very tricky with such a hemoglobin that is so low.  Attending addendum Patient seen and examined. 53 year old with a history of large colon mass, severe symptomatic anemia, being evaluated for generalized weakness and an MRI that was done revealed scattered embolic strokes in multiple vascular territories.  I have personally reviewed imaging. CTA of the chest with possible thrombus versus dissection in the ascending aorta. Agree with the examination as above. Patient very combative and not willing to participate in the exam in spite of getting an interpreter on. Does have left hemiparesis on my exam.  Recommendations Ordered CT of the chest and aorta-gated to better evaluate the ascending aortic lesion. Based on whether this is dissection or thrombus, decision can be made on anticoagulation. Irrespective, given his severe symptomatic anemia, which is being treated by transfusions, it would be a hard decision to start him on anticoagulation but that decision will have to be made once more imaging is made available. Patient remains very agitated and not willing to participate with testing. Due to his severe malnutrition, prealbumin less than 5, suspect there might be a component of Wernicke's. High-dose thiamine supplementation should also be done.  Stroke neurology will follow with you  -- Amie Portland, MD [facility] Pager: [phone] If 7pm to 7am, please call on call as listed on AMION.

## 2018-07-20 NOTE — Progress Notes (Signed)
[facility] Progress Note     Subjective: CC: uncomfortable Interpreter used to communicate with patient. Patient yelling that he needs to be changed and is uncomfortable from urinating in bed when I entered room and for the first several minutes. Patient does report generalized abdominal pain, occasional nausea. Last BM 1-2 days ago. He reports he is starving to death and requests a coke. Discussed aortic thrombus and MRI with patient who asks "why am I the only one with these things". Understanding of overall condition seems somewhat limited.   Objective: Vital signs in last 24 hours: Temp:  [97.5 F (36.4 C)-97.7 F (36.5 C)] 97.5 F (36.4 C) (05/18 0512) Pulse Rate:  [75-85] 85 (05/18 0512) Resp:  [18] 18 (05/18 0512) BP: (97-105)/(68-83) 98/76 (05/18 0512) SpO2:  [100 %] 100 % (05/18 0512) Weight:  [51.5 kg] 51.5 kg (05/18 0500) Last BM Date: 07/06/18  Intake/Output from previous day: 05/17 0701 - 05/18 0700 In: 578.8 [I.V.:486.2; IV Piggyback:92.7] Out: 700 [Urine:700] Intake/Output this shift: No intake/output data recorded.  PE: Gen:  Alert, NAD Card:  Regular rate and rhythm, BL pedal edema Pulm:  Normal effort, clear to auscultation bilaterally Abd: Soft, generalized TTP, non-distended, +BS Neuro: speech clear, follows commands, LUE weakness noted   Lab Results:  Recent Labs    07/19/18 0953 07/20/18 0337  WBC 26.2* 25.0*  HGB 7.6* 7.6*  HCT 23.7* 23.6*  PLT 341 334   BMET Recent Labs    07/19/18 0534 07/20/18 0337  NA 135 132*  K 3.2* 3.9  CL 104 106  CO2 19* 20*  GLUCOSE 84 85  BUN 17 12  CREATININE 0.50* 0.54*  CALCIUM 7.4* 7.2*   PT/INR Recent Labs    07/19/18 0534  LABPROT 16.2*  INR 1.3*   CMP     Component Value Date/Time   NA 132 (L) 07/20/2018 0337   K 3.9 07/20/2018 0337   CL 106 07/20/2018 0337   CO2 20 (L) 07/20/2018 0337   GLUCOSE 85 07/20/2018 0337   BUN 12 07/20/2018 0337   CREATININE 0.54 (L)  07/20/2018 0337   CALCIUM 7.2 (L) 07/20/2018 0337   PROT 5.0 (L) 07/20/2018 0337   ALBUMIN 1.3 (L) 07/20/2018 0337   AST 10 (L) 07/20/2018 0337   ALT 9 07/20/2018 0337   ALKPHOS 74 07/20/2018 0337   BILITOT 1.3 (H) 07/20/2018 0337   GFRNONAA >60 07/20/2018 0337   GFRAA >60 07/20/2018 0337   Lipase     Component Value Date/Time   LIPASE 17 07/06/2018 1615       Studies/Results: Ct Chest W Contrast  Result Date: 07/20/2018 CLINICAL DATA:  Colon cancer. EXAM: CT CHEST, ABDOMEN, AND PELVIS WITH CONTRAST TECHNIQUE: Multidetector CT imaging of the chest, abdomen and pelvis was performed following the standard protocol during bolus administration of intravenous contrast. CONTRAST:  124mL OMNIPAQUE IOHEXOL 300 MG/ML  SOLN COMPARISON:  07/06/2018 FINDINGS: CT CHEST FINDINGS Cardiovascular: The heart size is normal. Calcification in the LAD coronary artery. There is a 2.5 cm low-attenuation filling defect arising from the right lateral wall of the ascending thoracic aorta concerning for thrombus. Mediastinum/Nodes: Normal appearance of the thyroid gland. The trachea appears patent and is midline. Normal appearance of the esophagus. No mediastinal or hilar adenopathy. No axillary or supraclavicular adenopathy. Lungs/Pleura: Small bilateral pleural effusions, similar. Moderate changes of centrilobular and paraseptal emphysema. Scar like density identified in the right apex. No suspicious pulmonary nodule or mass identified. Musculoskeletal: No aggressive lytic or sclerotic  bone lesions. CT ABDOMEN PELVIS FINDINGS Hepatobiliary: Low-density structure in lateral segment of left lobe of liver is unchanged measuring 9 mm, image 63/3. No new liver abnormality identified. Pancreas: Unremarkable. No pancreatic ductal dilatation or surrounding inflammatory changes. Spleen: Normal in size. Small peripheral wedge-shaped areas of low attenuation concerning for splenic infarcts. Adrenals/Urinary Tract: Normal  appearance of the adrenal glands. There are several small right kidney lesions, too small to reliably characterize. No hydronephrosis bilaterally. Urinary bladder is negative. Stomach/Bowel: The stomach is nondistended. The proximal small bowel loops have a normal caliber. Abnormal bowel wall edema involving the terminal ileum is identified, similar to previous exam. Large necrotic mass within the right abdomen centered around the ascending colon is again noted measuring 10.2 by 8.6 by 10.1 cm (volume = 460 cm^3). Previously this measured 9.9 x 9.9 by 9.6 cm (volume = 490 cm^3). As noted previously tumor appears to involve the second and third portions of the duodenum. Distal colon unremarkable. Vascular/Lymphatic: Aortic atherosclerosis without aneurysm. No abdominopelvic adenopathy. Reproductive: Prostate is unremarkable. Other: Diffuse abdominopelvic ascites is identified. Musculoskeletal: No acute or significant osseous findings. IMPRESSION: 1. Similar size of large fungating mass within the right hemiabdomen centered around the ascending colon. Invasion of the second and third portions of the duodenum noted. 2. Ascites. 3. Small bilateral pleural effusions. Mural filling defect along the right lateral wall of the ascending thoracic aorta is concerning for thrombus, image 44/6. 4. Small splenic infarcts. Electronically Signed   By: Kerby Moors M.D.   On: 07/20/2018 09:12   Mr Jeri Cos DX Contrast  Result Date: 07/20/2018 CLINICAL DATA:  53 y/o M; left arm weakness. Possible colon cancer. EXAM: MRI HEAD WITHOUT AND WITH CONTRAST TECHNIQUE: Multiplanar, multiecho pulse sequences of the brain and surrounding structures were obtained without and with intravenous contrast. CONTRAST:  5 cc Gadavist. COMPARISON:  None. FINDINGS: Brain: Numerous punctate foci of reduced diffusion are present throughout the right greater than left posterior frontal lobes, bilateral parietal lobes, right posterior temporal lobe,  bilateral occipital lobes, and the right cerebellar hemisphere. Foci of reduced diffusion demonstrate T2 FLAIR hyperintensity. No hemorrhage. After administration of intravenous contrast there is no abnormal enhancement. No extra-axial collection, hydrocephalus, mass effect, or herniation. Vascular: Normal flow voids. Skull and upper cervical spine: Normal marrow signal. Sinuses/Orbits: Negative. Other: None. IMPRESSION: 1. Numerous punctate foci of acute/early subacute infarction are present throughout the right greater than left posterior cerebral hemispheres and the right cerebellar hemisphere. Multiple vascular territories suggests embolic etiology. No hemorrhage or mass effect. 2. No enhancing intracranial metastasis identified. These results will be called to the ordering clinician or representative by the Radiologist Assistant, and communication documented in the PACS or zVision Dashboard. Electronically Signed   By: Kristine Garbe M.D.   On: 07/20/2018 01:18   Ct Abdomen Pelvis W Contrast  Result Date: 07/20/2018 CLINICAL DATA:  Colon cancer. EXAM: CT CHEST, ABDOMEN, AND PELVIS WITH CONTRAST TECHNIQUE: Multidetector CT imaging of the chest, abdomen and pelvis was performed following the standard protocol during bolus administration of intravenous contrast. CONTRAST:  146mL OMNIPAQUE IOHEXOL 300 MG/ML  SOLN COMPARISON:  07/06/2018 FINDINGS: CT CHEST FINDINGS Cardiovascular: The heart size is normal. Calcification in the LAD coronary artery. There is a 2.5 cm low-attenuation filling defect arising from the right lateral wall of the ascending thoracic aorta concerning for thrombus. Mediastinum/Nodes: Normal appearance of the thyroid gland. The trachea appears patent and is midline. Normal appearance of the esophagus. No mediastinal or hilar adenopathy. No  axillary or supraclavicular adenopathy. Lungs/Pleura: Small bilateral pleural effusions, similar. Moderate changes of centrilobular and  paraseptal emphysema. Scar like density identified in the right apex. No suspicious pulmonary nodule or mass identified. Musculoskeletal: No aggressive lytic or sclerotic bone lesions. CT ABDOMEN PELVIS FINDINGS Hepatobiliary: Low-density structure in lateral segment of left lobe of liver is unchanged measuring 9 mm, image 63/3. No new liver abnormality identified. Pancreas: Unremarkable. No pancreatic ductal dilatation or surrounding inflammatory changes. Spleen: Normal in size. Small peripheral wedge-shaped areas of low attenuation concerning for splenic infarcts. Adrenals/Urinary Tract: Normal appearance of the adrenal glands. There are several small right kidney lesions, too small to reliably characterize. No hydronephrosis bilaterally. Urinary bladder is negative. Stomach/Bowel: The stomach is nondistended. The proximal small bowel loops have a normal caliber. Abnormal bowel wall edema involving the terminal ileum is identified, similar to previous exam. Large necrotic mass within the right abdomen centered around the ascending colon is again noted measuring 10.2 by 8.6 by 10.1 cm (volume = 460 cm^3). Previously this measured 9.9 x 9.9 by 9.6 cm (volume = 490 cm^3). As noted previously tumor appears to involve the second and third portions of the duodenum. Distal colon unremarkable. Vascular/Lymphatic: Aortic atherosclerosis without aneurysm. No abdominopelvic adenopathy. Reproductive: Prostate is unremarkable. Other: Diffuse abdominopelvic ascites is identified. Musculoskeletal: No acute or significant osseous findings. IMPRESSION: 1. Similar size of large fungating mass within the right hemiabdomen centered around the ascending colon. Invasion of the second and third portions of the duodenum noted. 2. Ascites. 3. Small bilateral pleural effusions. Mural filling defect along the right lateral wall of the ascending thoracic aorta is concerning for thrombus, image 44/6. 4. Small splenic infarcts. Electronically  Signed   By: Kerby Moors M.D.   On: 07/20/2018 09:12   Vas [place] Upper Extremity Venous Duplex  Result Date: 07/20/2018 UPPER VENOUS STUDY  Indications: Swelling Comparison Study: No prior study on file for comparison Performing Technologist: Sharion Dove RVS  Examination Guidelines: A complete evaluation includes B-mode imaging, spectral Doppler, color Doppler, and power Doppler as needed of all accessible portions of each vessel. Bilateral testing is considered an integral part of a complete examination. Limited examinations for reoccurring indications may be performed as noted.  Right Findings: +----------+------------+---------+-----------+----------+-------+ RIGHT     CompressiblePhasicitySpontaneousPropertiesSummary +----------+------------+---------+-----------+----------+-------+ Subclavian               Yes       Yes                      +----------+------------+---------+-----------+----------+-------+  Left Findings: +----------+------------+---------+-----------+----------+-------+ LEFT      CompressiblePhasicitySpontaneousPropertiesSummary +----------+------------+---------+-----------+----------+-------+ IJV                      Yes       Yes                      +----------+------------+---------+-----------+----------+-------+ Subclavian    Full       Yes       Yes                      +----------+------------+---------+-----------+----------+-------+ Axillary      Full       Yes       Yes                      +----------+------------+---------+-----------+----------+-------+ Brachial      Full       Yes  Yes                      +----------+------------+---------+-----------+----------+-------+ Radial        Full                                          +----------+------------+---------+-----------+----------+-------+ Ulnar         Full                                           +----------+------------+---------+-----------+----------+-------+ Cephalic    Partial                                 Chronic +----------+------------+---------+-----------+----------+-------+ Basilic       Full                                          +----------+------------+---------+-----------+----------+-------+  Summary:  Right: No evidence of thrombosis in the subclavian.  Left: No evidence of deep vein thrombosis in the upper extremity. 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 Chronic partial thrombus noted in a small portion of the cephalic vein from [facility] to mid upper arm.  *See table(s) above for measurements and observations.  Diagnosing physician: Harold Barban MD Electronically signed by Harold Barban MD on 07/20/2018 at 9:11:19 AM.    Final     Anti-infectives: Anti-infectives (From admission, onward)   Start     Dose/Rate Route Frequency Ordered Stop   07/19/18 1400  piperacillin-tazobactam (ZOSYN) IVPB 3.375 g     3.375 g 12.5 mL/hr over 240 Minutes Intravenous Every 8 hours 07/19/18 0651     07/19/18 0600  piperacillin-tazobactam (ZOSYN) IVPB 3.375 g  Status:  Discontinued     3.375 g 12.5 mL/hr over 240 Minutes Intravenous Every 8 hours 07/19/18 0442 07/19/18 0447   07/19/18 0500  piperacillin-tazobactam (ZOSYN) IVPB 3.375 g     3.375 g 100 mL/hr over 30 Minutes Intravenous  Once 07/19/18 0447 07/19/18 1445       Assessment/Plan Chronic anemia Severe malnutrition - check prealbumin, was 5.7 on 5/4 New CVA with L sided weakness - multiple foci seen on MRI Thrombus of thoracic aorta - needs cards evaluation, ok for anticoagulation from a surgery standpoint, although will have to closely watch for GI bleeding   Neglected right colon mass with possible extension to involve the duodenum - patient has returned after leaving AMA because he now wants surgery - if patient shows signs of obstruction from large  mass, would recommend NGT decompression - ok to have a diet from surgery standpoint, will start TPN based on prealbumin when that comes back - appreciate palliative involvement to further assess goals of care   ID - zosyn 5/17>> FEN - ok for diet from surgery standpoint, likely needs SLP eval  VTE - SCDs Foley - none Follow up - TBD  LOS: 1 day    Brigid Re , [facility] 07/20/2018, 10:21 AM Pager: [phone] Consults: [phone]

## 2018-07-20 NOTE — Progress Notes (Signed)
Ordered dinner tray for patient. Pt oriented, appears sad. Replies that he is not doing well but just shakes his head when asked what is wrong. Denied pain. CT scan called for him, will continue to monitor.

## 2018-07-20 NOTE — Progress Notes (Signed)
SLP Cancellation Note  Patient Details Name: Brandon Landry MRN: [id number] DOB: 01-17-1966   Cancelled treatment:       Reason Eval/Treat Not Completed: Patient at procedure or test/unavailable. Attempted swallow eval with video interpreter. Pt answered phone and engaged in prolonged conversation. Will f/u as able.    Sharilyn Geisinger, Katherene Ponto 07/20/2018, 12:23 PM

## 2018-07-21 ENCOUNTER — Inpatient Hospital Stay (HOSPITAL_COMMUNITY): Payer: Medicaid Other

## 2018-07-21 ENCOUNTER — Inpatient Hospital Stay: Payer: Self-pay

## 2018-07-21 DIAGNOSIS — I6340 Cerebral infarction due to embolism of unspecified cerebral artery: Secondary | ICD-10-CM

## 2018-07-21 DIAGNOSIS — I6349 Cerebral infarction due to embolism of other cerebral artery: Secondary | ICD-10-CM

## 2018-07-21 LAB — BASIC METABOLIC PANEL
Anion gap: 7 (ref 5–15)
BUN: 9 mg/dL (ref 6–20)
CO2: 22 mmol/L (ref 22–32)
Calcium: 7.3 mg/dL — ABNORMAL LOW (ref 8.9–10.3)
Chloride: 104 mmol/L (ref 98–111)
Creatinine, Ser: 0.48 mg/dL — ABNORMAL LOW (ref 0.61–1.24)
GFR calc Af Amer: >60 mL/min (ref >60)
GFR calc non Af Amer: >60 mL/min (ref >60)
Glucose, Bld: 97 mg/dL (ref 70–99)
Potassium: 3.6 mmol/L (ref 3.5–5.1)
Sodium: 133 mmol/L — ABNORMAL LOW (ref 135–145)

## 2018-07-21 LAB — CBC
HCT: 22 % — ABNORMAL LOW (ref 39.0–52.0)
Hemoglobin: 7.2 g/dL — ABNORMAL LOW (ref 13.0–17.0)
MCH: 26.8 pg (ref 26.0–34.0)
MCHC: 32.7 g/dL (ref 30.0–36.0)
MCV: 81.8 fL (ref 80.0–100.0)
Platelets: 292 10*3/uL (ref 150–400)
RBC: 2.69 MIL/uL — ABNORMAL LOW (ref 4.22–5.81)
RDW: 18.4 % — ABNORMAL HIGH (ref 11.5–15.5)
WBC: 21.6 10*3/uL — ABNORMAL HIGH (ref 4.0–10.5)
nRBC: 0 % (ref 0.0–0.2)

## 2018-07-21 LAB — PROCALCITONIN: Procalcitonin: 0.11 ng/mL

## 2018-07-21 LAB — GLUCOSE, CAPILLARY: Glucose-Capillary: 71 mg/dL (ref 70–99)

## 2018-07-21 LAB — MAGNESIUM: Magnesium: 2 mg/dL (ref 1.7–2.4)

## 2018-07-21 MED ORDER — IOHEXOL 350 MG/ML SOLN
125.0000 mL | Freq: Once | INTRAVENOUS | Status: AC | PRN
  Administered 2018-07-21: 120 mL via INTRAVENOUS

## 2018-07-21 MED ORDER — INSULIN ASPART 100 UNIT/ML ~~LOC~~ SOLN
0.0000 [IU] | Freq: Four times a day (QID) | SUBCUTANEOUS | Status: DC
  Administered 2018-07-23: 18:00:00 1 [IU] via SUBCUTANEOUS

## 2018-07-21 MED ORDER — SODIUM PHOSPHATES 45 MMOLE/15ML IV SOLN
10.0000 mmol | Freq: Once | INTRAVENOUS | Status: AC
  Administered 2018-07-21: 10 mmol via INTRAVENOUS
  Filled 2018-07-21: qty 3.33

## 2018-07-21 MED ORDER — TRAVASOL 10 % IV SOLN
INTRAVENOUS | Status: AC
  Administered 2018-07-21: 21:00:00 via INTRAVENOUS
  Filled 2018-07-21: qty 439.2

## 2018-07-21 MED ORDER — SODIUM CHLORIDE 0.9% FLUSH
10.0000 mL | INTRAVENOUS | Status: DC | PRN
  Administered 2018-07-21 – 2018-07-23 (×2): 10 mL
  Filled 2018-07-21 (×2): qty 40

## 2018-07-21 NOTE — Progress Notes (Addendum)
Pt back to bed from chair. Pt is feeling much better subjectively and by his report. He is moving the left arm more. He walked with PT, but required assist of 2 to get back into bed. PICC in place, infusing. Bed alarm placed on, will continue to monitor, I changed all lines per policy for PICCs.

## 2018-07-21 NOTE — Progress Notes (Addendum)
STROKE TEAM PROGRESS NOTE   INTERVAL HISTORY Pt lying in bed, seems in good spirit. Able to smile and make joke with me. Still has left sided mild weakness. Palliative care involved so far pt optioned full care but DNR status. Surgery did not think he was surgery candidate and CTA chest concerning for ascending aorta infectious vegetation or thrombus. Not candidate for anticoagulation due to severe anemia.   Vitals:   07/20/18 1453 07/20/18 2201 07/21/18 0500 07/21/18 0543  BP: 102/83 107/83  113/84  Pulse: 94 81  79  Resp: 20   16  Temp: 97.6 F (36.4 C) 97.6 F (36.4 C)  98.2 F (36.8 C)  TempSrc: Axillary Oral  Oral  SpO2: 100% 100%  100%  Weight:   49.7 kg     CBC:  Recent Labs  Lab 07/19/18 0953 07/20/18 0337 07/21/18 0300  WBC 26.2* 25.0* 21.6*  NEUTROABS 22.7*  --   --   HGB 7.6* 7.6* 7.2*  HCT 23.7* 23.6* 22.0*  MCV 82.6 81.7 81.8  PLT 341 334 626    Basic Metabolic Panel:  Recent Labs  Lab 07/20/18 0337 07/21/18 0300  NA 132* 133*  K 3.9 3.6  CL 106 104  CO2 20* 22  GLUCOSE 85 97  BUN 12 9  CREATININE 0.54* 0.48*  CALCIUM 7.2* 7.3*  MG 1.9 2.0  PHOS 2.6  --    Lipid Panel: No results found for: CHOL, TRIG, HDL, CHOLHDL, VLDL, LDLCALC HgbA1c: No results found for: HGBA1C Urine Drug Screen: No results found for: LABOPIA, COCAINSCRNUR, LABBENZ, AMPHETMU, THCU, LABBARB  Alcohol Level No results found for: [facility]  IMAGING Ct Chest W Contrast  Result Date: 07/20/2018 CLINICAL DATA:  Colon cancer. EXAM: CT CHEST, ABDOMEN, AND PELVIS WITH CONTRAST TECHNIQUE: Multidetector CT imaging of the chest, abdomen and pelvis was performed following the standard protocol during bolus administration of intravenous contrast. CONTRAST:  175mL OMNIPAQUE IOHEXOL 300 MG/ML  SOLN COMPARISON:  07/06/2018 FINDINGS: CT CHEST FINDINGS Cardiovascular: The heart size is normal. Calcification in the LAD coronary artery. There is a 2.5 cm low-attenuation filling defect arising from the  right lateral wall of the ascending thoracic aorta concerning for thrombus. Mediastinum/Nodes: Normal appearance of the thyroid gland. The trachea appears patent and is midline. Normal appearance of the esophagus. No mediastinal or hilar adenopathy. No axillary or supraclavicular adenopathy. Lungs/Pleura: Small bilateral pleural effusions, similar. Moderate changes of centrilobular and paraseptal emphysema. Scar like density identified in the right apex. No suspicious pulmonary nodule or mass identified. Musculoskeletal: No aggressive lytic or sclerotic bone lesions. CT ABDOMEN PELVIS FINDINGS Hepatobiliary: Low-density structure in lateral segment of left lobe of liver is unchanged measuring 9 mm, image 63/3. No new liver abnormality identified. Pancreas: Unremarkable. No pancreatic ductal dilatation or surrounding inflammatory changes. Spleen: Normal in size. Small peripheral wedge-shaped areas of low attenuation concerning for splenic infarcts. Adrenals/Urinary Tract: Normal appearance of the adrenal glands. There are several small right kidney lesions, too small to reliably characterize. No hydronephrosis bilaterally. Urinary bladder is negative. Stomach/Bowel: The stomach is nondistended. The proximal small bowel loops have a normal caliber. Abnormal bowel wall edema involving the terminal ileum is identified, similar to previous exam. Large necrotic mass within the right abdomen centered around the ascending colon is again noted measuring 10.2 by 8.6 by 10.1 cm (volume = 460 cm^3). Previously this measured 9.9 x 9.9 by 9.6 cm (volume = 490 cm^3). As noted previously tumor appears to involve the second and third portions  of the duodenum. Distal colon unremarkable. Vascular/Lymphatic: Aortic atherosclerosis without aneurysm. No abdominopelvic adenopathy. Reproductive: Prostate is unremarkable. Other: Diffuse abdominopelvic ascites is identified. Musculoskeletal: No acute or significant osseous findings.  IMPRESSION: 1. Similar size of large fungating mass within the right hemiabdomen centered around the ascending colon. Invasion of the second and third portions of the duodenum noted. 2. Ascites. 3. Small bilateral pleural effusions. Mural filling defect along the right lateral wall of the ascending thoracic aorta is concerning for thrombus, image 44/6. 4. Small splenic infarcts. Electronically Signed   By: Kerby Moors M.D.   On: 07/20/2018 09:12   Mr Jeri Cos [place] Contrast  Result Date: 07/20/2018 CLINICAL DATA:  53 y/o M; left arm weakness. Possible colon cancer. EXAM: MRI HEAD WITHOUT AND WITH CONTRAST TECHNIQUE: Multiplanar, multiecho pulse sequences of the brain and surrounding structures were obtained without and with intravenous contrast. CONTRAST:  5 cc Gadavist. COMPARISON:  None. FINDINGS: Brain: Numerous punctate foci of reduced diffusion are present throughout the right greater than left posterior frontal lobes, bilateral parietal lobes, right posterior temporal lobe, bilateral occipital lobes, and the right cerebellar hemisphere. Foci of reduced diffusion demonstrate T2 FLAIR hyperintensity. No hemorrhage. After administration of intravenous contrast there is no abnormal enhancement. No extra-axial collection, hydrocephalus, mass effect, or herniation. Vascular: Normal flow voids. Skull and upper cervical spine: Normal marrow signal. Sinuses/Orbits: Negative. Other: None. IMPRESSION: 1. Numerous punctate foci of acute/early subacute infarction are present throughout the right greater than left posterior cerebral hemispheres and the right cerebellar hemisphere. Multiple vascular territories suggests embolic etiology. No hemorrhage or mass effect. 2. No enhancing intracranial metastasis identified. These results will be called to the ordering clinician or representative by the Radiologist Assistant, and communication documented in the PACS or zVision Dashboard. Electronically Signed   By: Kristine Garbe M.D.   On: 07/20/2018 01:18   Ct Abdomen Pelvis W Contrast  Result Date: 07/20/2018 CLINICAL DATA:  Colon cancer. EXAM: CT CHEST, ABDOMEN, AND PELVIS WITH CONTRAST TECHNIQUE: Multidetector CT imaging of the chest, abdomen and pelvis was performed following the standard protocol during bolus administration of intravenous contrast. CONTRAST:  127mL OMNIPAQUE IOHEXOL 300 MG/ML  SOLN COMPARISON:  07/06/2018 FINDINGS: CT CHEST FINDINGS Cardiovascular: The heart size is normal. Calcification in the LAD coronary artery. There is a 2.5 cm low-attenuation filling defect arising from the right lateral wall of the ascending thoracic aorta concerning for thrombus. Mediastinum/Nodes: Normal appearance of the thyroid gland. The trachea appears patent and is midline. Normal appearance of the esophagus. No mediastinal or hilar adenopathy. No axillary or supraclavicular adenopathy. Lungs/Pleura: Small bilateral pleural effusions, similar. Moderate changes of centrilobular and paraseptal emphysema. Scar like density identified in the right apex. No suspicious pulmonary nodule or mass identified. Musculoskeletal: No aggressive lytic or sclerotic bone lesions. CT ABDOMEN PELVIS FINDINGS Hepatobiliary: Low-density structure in lateral segment of left lobe of liver is unchanged measuring 9 mm, image 63/3. No new liver abnormality identified. Pancreas: Unremarkable. No pancreatic ductal dilatation or surrounding inflammatory changes. Spleen: Normal in size. Small peripheral wedge-shaped areas of low attenuation concerning for splenic infarcts. Adrenals/Urinary Tract: Normal appearance of the adrenal glands. There are several small right kidney lesions, too small to reliably characterize. No hydronephrosis bilaterally. Urinary bladder is negative. Stomach/Bowel: The stomach is nondistended. The proximal small bowel loops have a normal caliber. Abnormal bowel wall edema involving the terminal ileum is identified,  similar to previous exam. Large necrotic mass within the right abdomen centered around the ascending colon  is again noted measuring 10.2 by 8.6 by 10.1 cm (volume = 460 cm^3). Previously this measured 9.9 x 9.9 by 9.6 cm (volume = 490 cm^3). As noted previously tumor appears to involve the second and third portions of the duodenum. Distal colon unremarkable. Vascular/Lymphatic: Aortic atherosclerosis without aneurysm. No abdominopelvic adenopathy. Reproductive: Prostate is unremarkable. Other: Diffuse abdominopelvic ascites is identified. Musculoskeletal: No acute or significant osseous findings. IMPRESSION: 1. Similar size of large fungating mass within the right hemiabdomen centered around the ascending colon. Invasion of the second and third portions of the duodenum noted. 2. Ascites. 3. Small bilateral pleural effusions. Mural filling defect along the right lateral wall of the ascending thoracic aorta is concerning for thrombus, image 44/6. 4. Small splenic infarcts. Electronically Signed   By: Kerby Moors M.D.   On: 07/20/2018 09:12   Vas [place] Upper Extremity Venous Duplex  Result Date: 07/20/2018 UPPER VENOUS STUDY  Indications: Swelling Comparison Study: No prior study on file for comparison Performing Technologist: Sharion Dove RVS  Examination Guidelines: A complete evaluation includes B-mode imaging, spectral Doppler, color Doppler, and power Doppler as needed of all accessible portions of each vessel. Bilateral testing is considered an integral part of a complete examination. Limited examinations for reoccurring indications may be performed as noted.  Right Findings: +----------+------------+---------+-----------+----------+-------+ RIGHT     CompressiblePhasicitySpontaneousPropertiesSummary +----------+------------+---------+-----------+----------+-------+ Subclavian               Yes       Yes                      +----------+------------+---------+-----------+----------+-------+   Left Findings: +----------+------------+---------+-----------+----------+-------+ LEFT      CompressiblePhasicitySpontaneousPropertiesSummary +----------+------------+---------+-----------+----------+-------+ IJV                      Yes       Yes                      +----------+------------+---------+-----------+----------+-------+ Subclavian    Full       Yes       Yes                      +----------+------------+---------+-----------+----------+-------+ Axillary      Full       Yes       Yes                      +----------+------------+---------+-----------+----------+-------+ Brachial      Full       Yes       Yes                      +----------+------------+---------+-----------+----------+-------+ Radial        Full                                          +----------+------------+---------+-----------+----------+-------+ Ulnar         Full                                          +----------+------------+---------+-----------+----------+-------+ Cephalic    Partial  Chronic +----------+------------+---------+-----------+----------+-------+ Basilic       Full                                          +----------+------------+---------+-----------+----------+-------+  Summary:  Right: No evidence of thrombosis in the subclavian.  Left: No evidence of deep vein thrombosis in the upper extremity. 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 Chronic partial thrombus noted in a small portion of the cephalic vein from [facility] to mid upper arm.  *See table(s) above for measurements and observations.  Diagnosing physician: Harold Barban MD Electronically signed by Harold Barban MD on 07/20/2018 at 9:11:19 AM.    Final    2D Echocardiogram   1. The left ventricle has mild-moderately reduced systolic function, with an ejection fraction of 40-45%. The cavity size was normal. Left  ventricular diastolic parameters were normal.  2. There is akinesis of the apical inferolateral, inferior and anterior walls as well as entire apex. There is significant spontaneous echo contrast in the LV cavity consistent with low flow state. Consider limited study with definity to rule out LV  thrombus.  3. The right ventricle has normal systolic function. The cavity was normal. There is no increase in right ventricular wall thickness. Right ventricular systolic pressure could not be assessed.  4. Trivial pericardial effusion is present.  5. The aortic valve is tricuspid. Mild sclerosis of the aortic valve.  PHYSICAL EXAM  Temp:  [97.6 F (36.4 C)-98.2 F (36.8 C)] 98.2 F (36.8 C) (05/19 0543) Pulse Rate:  [79-81] 79 (05/19 0543) Resp:  [16] 16 (05/19 0543) BP: (107-113)/(83-84) 113/84 (05/19 0543) SpO2:  [100 %] 100 % (05/19 0543) Weight:  [49.7 kg] 49.7 kg (05/19 0500)  General - Well nourished, well developed, mildly lethargic  Ophthalmologic - fundi not visualized due to noncooperation.  Cardiovascular - Regular rate and rhythm.  Mental Status -  Level of arousal and orientation to time, place, and person were intact. Language including expression, naming, repetition, comprehension was assessed and found intact, however, moderate dysarthria  Cranial Nerves II - XII - II - Visual field intact OU. III, IV, VI - Extraocular movements intact. V - Facial sensation intact bilaterally. VII - bilateral facial weakness. VIII - Hearing & vestibular intact bilaterally. X - Palate elevates symmetrically, moderate dysarthria. XI - Chin turning & shoulder shrug intact bilaterally. XII - Tongue protrusion intact.  Motor Strength - The patient's strength was 4/5 right upper and bilateral lower extremities, left upper extremity 3+/5 and 3/5 handgrip.  Bulk was normal and fasciculations were absent.   Motor Tone - Muscle tone was assessed at the neck and appendages and was  normal.  Reflexes - The patient's reflexes were symmetrical in all extremities and he had no pathological reflexes.  Sensory - Light touch, temperature/pinprick were assessed and were symmetrical.    Coordination - The patient had normal movements in the right hand with no ataxia or dysmetria, however left arm finger-to-nose ataxic but still proportional to the weakness.  Tremor was absent.  Gait and Station - deferred.   ASSESSMENT/PLAN Mr. Hussam Muniz is a 53 y.o. male with history of hopsitalization 5/4-5/6 for severe anemia 3.1 s/p transfusion to 7.7 who refused surgical intervention, returning to the hospital 5/17 with complaint of generalized weakness and hgb of 3.2 and low BP. MRI showed numerous B punctate cerebral and R cerebellar  infarcts with abdominal mass and thoracic aortic thrombus.    Stroke:  bilateral cerebral and right cerebellar punctate infarcts in setting of ascending aortic vegetation/thrombus and large abdominal mass, likely hypercoagulable from advanced cancer  MRI with and without contrast - numerous punctate foci of acute/early subacute infarctions in the right greater than left posterior cerebral hemispheres and right cerebellar hemispheres.  No enhancing metastasis identified  CT abdomen pelvis -  large fungating mass in the right hemi-abdomen centered around the ascending colon.  Invasion of second and third portion of duodenum noted.  Ascites.  Small splenic infarcts.  CT of chest - small bilateral pleural effusions with mural filling defect along the right lateral wall of asending thoracic aorta concerning for thrombus.  CTA aorta - several areas of mural thickening and intimal irregularity are noted in the ascending thoracic aorta, strongly favored to represent infectious vegetations/thrombus  2D Echo EF 40-45%, low flow state LV recommend r/o thrombus  SCDs for VTE prophylaxis  No antithrombotic prior to admission, now on No antithrombotic given severe  anemia. Pt not candidate for [facility] unless anemia stable and bleeding source secured.  Given advanced cancer, will not check lipids or treat with statin  Therapy recommendations:  SNF  Disposition:  pending  Palliative care involved. Family considering pts return to [place]  Ascending aorta vegetation/thrombus  CTA aorta - Several areas of mural thickening and intimal irregularity are noted in the ascending thoracic aorta  Likely related to the advanced metastatic colon cancer  Not a candidate for anticoagulation due to severe anemia  Recommend hospice care  Giant mass of hepatic flexure of colon - probable perforated colon cancer  GS has signed off d/t no planned intervention  Recommend palliative care on board  Recommend hospice care  Anemia  Severe microcytic Anemia d/t chronic blood loss   Hb 3.2 with hypotension, received PRBC -> 8.1->7.6->7.2  Transfusion as needed  Recommend hospice care  Severe malnutrition  Dietitian on board  Ensure  May consider TPN via PICC line if needed  Hospice care recommended  Other Stroke Risk Factors  Cigarette smoker  Hypokalemia  Hospital day # 2  Neurology will sign off. Please call with questions. Thanks for the consult.  Rosalin Hawking, MD PhD Stroke Neurology 07/21/2018 7:05 PM  I spent  35 minutes in total face-to-face time with the patient, more than 50% of which was spent in counseling and coordination of care, reviewing test results, images and medication, and discussing the diagnosis of diffuse embolic stroke, advanced metastatic colon cancer with hypercoagulable state, severe anemia, ascending aorta thrombus, treatment plan and potential prognosis. This patient's care requiresreview of multiple databases, neurological assessment, discussion other specialists and medical decision making of high complexity.   To contact Stroke Continuity provider, please refer to [email or web address] After hours, contact General Neurology

## 2018-07-21 NOTE — Progress Notes (Signed)
Received call from patient's sister and nephew.  They have contacted a [place] based company that will consider transporting the patient back to [place], [place]  They requested medical information regarding patient.  Permission obtained from patient.  His wish is to be in [place] to receive treatment.   Medical information given including a list of diagnosis, and information from imaging, neuro consult, and internal medicine note.  Florentina Jenny, PA-C Palliative Medicine Pager: [phone]

## 2018-07-21 NOTE — Progress Notes (Signed)
Peripherally Inserted Central Catheter/Midline Placement  The IV Nurse has discussed with the patient and/or persons authorized to consent for the patient, the purpose of this procedure and the potential benefits and risks involved with this procedure.  The benefits include less needle sticks, lab draws from the catheter, and the patient may be discharged home with the catheter. Risks include, but not limited to, infection, bleeding, blood clot (thrombus formation), and puncture of an artery; nerve damage and irregular heartbeat and possibility to perform a PICC exchange if needed/ordered by physician.  Alternatives to this procedure were also discussed.  Bard Power PICC patient education guide, fact sheet on infection prevention and patient information card has been provided to patient /or left at bedside.    PICC/Midline Placement Documentation  PICC Double Lumen 07/21/18 PICC Right Brachial 40 cm 0 cm (Active)  Indication for Insertion or Continuance of Line Administration of hyperosmolar/irritating solutions (i.e. TPN, Vancomycin, etc.) 07/21/2018  2:14 PM  Exposed Catheter (cm) 0 cm 07/21/2018  2:14 PM  Site Assessment Clean;Dry;Intact 07/21/2018  2:14 PM  Lumen #1 Status Flushed;Blood return noted 07/21/2018  2:14 PM  Lumen #2 Status Flushed;Blood return noted 07/21/2018  2:14 PM  Dressing Type Transparent 07/21/2018  2:14 PM  Dressing Status Clean;Dry;Intact;Antimicrobial disc in place 07/21/2018  2:14 PM  Dressing Intervention New dressing 07/21/2018  2:14 PM  Dressing Change Due 07/28/18 07/21/2018  2:14 PM   Conssent signed by patient with interpreter thru [place], [place] 07/21/2018, 2:16 PM

## 2018-07-21 NOTE — Progress Notes (Signed)
Patient ID: Brandon Landry, male   DOB: 1965/10/19, 53 y.o.   MRN: [id number]       Subjective: No complaints today.  Still not eating because he wants [place] food and not American food  Objective: Vital signs in last 24 hours: Temp:  [97.6 F (36.4 C)-98.2 F (36.8 C)] 98.2 F (36.8 C) (05/19 0543) Pulse Rate:  [79-94] 79 (05/19 0543) Resp:  [16-20] 16 (05/19 0543) BP: (102-113)/(83-84) 113/84 (05/19 0543) SpO2:  [100 %] 100 % (05/19 0543) Weight:  [49.7 kg] 49.7 kg (05/19 0500) Last BM Date: 07/06/18  Intake/Output from previous day: No intake/output data recorded. Intake/Output this shift: No intake/output data recorded.  PE: Abd: soft, denies pain, but actively tries to guard with palpation, +BS  Lab Results:  Recent Labs    07/20/18 0337 07/21/18 0300  WBC 25.0* 21.6*  HGB 7.6* 7.2*  HCT 23.6* 22.0*  PLT 334 292   BMET Recent Labs    07/20/18 0337 07/21/18 0300  NA 132* 133*  K 3.9 3.6  CL 106 104  CO2 20* 22  GLUCOSE 85 97  BUN 12 9  CREATININE 0.54* 0.48*  CALCIUM 7.2* 7.3*   PT/INR Recent Labs    07/19/18 0534  LABPROT 16.2*  INR 1.3*   CMP     Component Value Date/Time   NA 133 (L) 07/21/2018 0300   K 3.6 07/21/2018 0300   CL 104 07/21/2018 0300   CO2 22 07/21/2018 0300   GLUCOSE 97 07/21/2018 0300   BUN 9 07/21/2018 0300   CREATININE 0.48 (L) 07/21/2018 0300   CALCIUM 7.3 (L) 07/21/2018 0300   PROT 5.0 (L) 07/20/2018 0337   ALBUMIN 1.3 (L) 07/20/2018 0337   AST 10 (L) 07/20/2018 0337   ALT 9 07/20/2018 0337   ALKPHOS 74 07/20/2018 0337   BILITOT 1.3 (H) 07/20/2018 0337   GFRNONAA >60 07/21/2018 0300   GFRAA >60 07/21/2018 0300   Lipase     Component Value Date/Time   LIPASE 17 07/06/2018 1615       Studies/Results: Ct Chest W Contrast  Result Date: 07/20/2018 CLINICAL DATA:  Colon cancer. EXAM: CT CHEST, ABDOMEN, AND PELVIS WITH CONTRAST TECHNIQUE: Multidetector CT imaging of the chest, abdomen and pelvis was performed  following the standard protocol during bolus administration of intravenous contrast. CONTRAST:  131mL OMNIPAQUE IOHEXOL 300 MG/ML  SOLN COMPARISON:  07/06/2018 FINDINGS: CT CHEST FINDINGS Cardiovascular: The heart size is normal. Calcification in the LAD coronary artery. There is a 2.5 cm low-attenuation filling defect arising from the right lateral wall of the ascending thoracic aorta concerning for thrombus. Mediastinum/Nodes: Normal appearance of the thyroid gland. The trachea appears patent and is midline. Normal appearance of the esophagus. No mediastinal or hilar adenopathy. No axillary or supraclavicular adenopathy. Lungs/Pleura: Small bilateral pleural effusions, similar. Moderate changes of centrilobular and paraseptal emphysema. Scar like density identified in the right apex. No suspicious pulmonary nodule or mass identified. Musculoskeletal: No aggressive lytic or sclerotic bone lesions. CT ABDOMEN PELVIS FINDINGS Hepatobiliary: Low-density structure in lateral segment of left lobe of liver is unchanged measuring 9 mm, image 63/3. No new liver abnormality identified. Pancreas: Unremarkable. No pancreatic ductal dilatation or surrounding inflammatory changes. Spleen: Normal in size. Small peripheral wedge-shaped areas of low attenuation concerning for splenic infarcts. Adrenals/Urinary Tract: Normal appearance of the adrenal glands. There are several small right kidney lesions, too small to reliably characterize. No hydronephrosis bilaterally. Urinary bladder is negative. Stomach/Bowel: The stomach is nondistended. The proximal small  bowel loops have a normal caliber. Abnormal bowel wall edema involving the terminal ileum is identified, similar to previous exam. Large necrotic mass within the right abdomen centered around the ascending colon is again noted measuring 10.2 by 8.6 by 10.1 cm (volume = 460 cm^3). Previously this measured 9.9 x 9.9 by 9.6 cm (volume = 490 cm^3). As noted previously tumor  appears to involve the second and third portions of the duodenum. Distal colon unremarkable. Vascular/Lymphatic: Aortic atherosclerosis without aneurysm. No abdominopelvic adenopathy. Reproductive: Prostate is unremarkable. Other: Diffuse abdominopelvic ascites is identified. Musculoskeletal: No acute or significant osseous findings. IMPRESSION: 1. Similar size of large fungating mass within the right hemiabdomen centered around the ascending colon. Invasion of the second and third portions of the duodenum noted. 2. Ascites. 3. Small bilateral pleural effusions. Mural filling defect along the right lateral wall of the ascending thoracic aorta is concerning for thrombus, image 44/6. 4. Small splenic infarcts. Electronically Signed   By: Kerby Moors M.D.   On: 07/20/2018 09:12   Mr Jeri Cos OA Contrast  Result Date: 07/20/2018 CLINICAL DATA:  53 y/o M; left arm weakness. Possible colon cancer. EXAM: MRI HEAD WITHOUT AND WITH CONTRAST TECHNIQUE: Multiplanar, multiecho pulse sequences of the brain and surrounding structures were obtained without and with intravenous contrast. CONTRAST:  5 cc Gadavist. COMPARISON:  None. FINDINGS: Brain: Numerous punctate foci of reduced diffusion are present throughout the right greater than left posterior frontal lobes, bilateral parietal lobes, right posterior temporal lobe, bilateral occipital lobes, and the right cerebellar hemisphere. Foci of reduced diffusion demonstrate T2 FLAIR hyperintensity. No hemorrhage. After administration of intravenous contrast there is no abnormal enhancement. No extra-axial collection, hydrocephalus, mass effect, or herniation. Vascular: Normal flow voids. Skull and upper cervical spine: Normal marrow signal. Sinuses/Orbits: Negative. Other: None. IMPRESSION: 1. Numerous punctate foci of acute/early subacute infarction are present throughout the right greater than left posterior cerebral hemispheres and the right cerebellar hemisphere. Multiple  vascular territories suggests embolic etiology. No hemorrhage or mass effect. 2. No enhancing intracranial metastasis identified. These results will be called to the ordering clinician or representative by the Radiologist Assistant, and communication documented in the PACS or zVision Dashboard. Electronically Signed   By: Kristine Garbe M.D.   On: 07/20/2018 01:18   Ct Abdomen Pelvis W Contrast  Result Date: 07/20/2018 CLINICAL DATA:  Colon cancer. EXAM: CT CHEST, ABDOMEN, AND PELVIS WITH CONTRAST TECHNIQUE: Multidetector CT imaging of the chest, abdomen and pelvis was performed following the standard protocol during bolus administration of intravenous contrast. CONTRAST:  182mL OMNIPAQUE IOHEXOL 300 MG/ML  SOLN COMPARISON:  07/06/2018 FINDINGS: CT CHEST FINDINGS Cardiovascular: The heart size is normal. Calcification in the LAD coronary artery. There is a 2.5 cm low-attenuation filling defect arising from the right lateral wall of the ascending thoracic aorta concerning for thrombus. Mediastinum/Nodes: Normal appearance of the thyroid gland. The trachea appears patent and is midline. Normal appearance of the esophagus. No mediastinal or hilar adenopathy. No axillary or supraclavicular adenopathy. Lungs/Pleura: Small bilateral pleural effusions, similar. Moderate changes of centrilobular and paraseptal emphysema. Scar like density identified in the right apex. No suspicious pulmonary nodule or mass identified. Musculoskeletal: No aggressive lytic or sclerotic bone lesions. CT ABDOMEN PELVIS FINDINGS Hepatobiliary: Low-density structure in lateral segment of left lobe of liver is unchanged measuring 9 mm, image 63/3. No new liver abnormality identified. Pancreas: Unremarkable. No pancreatic ductal dilatation or surrounding inflammatory changes. Spleen: Normal in size. Small peripheral wedge-shaped areas of low attenuation concerning  for splenic infarcts. Adrenals/Urinary Tract: Normal appearance of the  adrenal glands. There are several small right kidney lesions, too small to reliably characterize. No hydronephrosis bilaterally. Urinary bladder is negative. Stomach/Bowel: The stomach is nondistended. The proximal small bowel loops have a normal caliber. Abnormal bowel wall edema involving the terminal ileum is identified, similar to previous exam. Large necrotic mass within the right abdomen centered around the ascending colon is again noted measuring 10.2 by 8.6 by 10.1 cm (volume = 460 cm^3). Previously this measured 9.9 x 9.9 by 9.6 cm (volume = 490 cm^3). As noted previously tumor appears to involve the second and third portions of the duodenum. Distal colon unremarkable. Vascular/Lymphatic: Aortic atherosclerosis without aneurysm. No abdominopelvic adenopathy. Reproductive: Prostate is unremarkable. Other: Diffuse abdominopelvic ascites is identified. Musculoskeletal: No acute or significant osseous findings. IMPRESSION: 1. Similar size of large fungating mass within the right hemiabdomen centered around the ascending colon. Invasion of the second and third portions of the duodenum noted. 2. Ascites. 3. Small bilateral pleural effusions. Mural filling defect along the right lateral wall of the ascending thoracic aorta is concerning for thrombus, image 44/6. 4. Small splenic infarcts. Electronically Signed   By: Kerby Moors M.D.   On: 07/20/2018 09:12   Vas [place] Upper Extremity Venous Duplex  Result Date: 07/20/2018 UPPER VENOUS STUDY  Indications: Swelling Comparison Study: No prior study on file for comparison Performing Technologist: Sharion Dove RVS  Examination Guidelines: A complete evaluation includes B-mode imaging, spectral Doppler, color Doppler, and power Doppler as needed of all accessible portions of each vessel. Bilateral testing is considered an integral part of a complete examination. Limited examinations for reoccurring indications may be performed as noted.  Right Findings:  +----------+------------+---------+-----------+----------+-------+  RIGHT      Compressible Phasicity Spontaneous Properties Summary  +----------+------------+---------+-----------+----------+-------+  Subclavian                 Yes        Yes                         +----------+------------+---------+-----------+----------+-------+  Left Findings: +----------+------------+---------+-----------+----------+-------+  LEFT       Compressible Phasicity Spontaneous Properties Summary  +----------+------------+---------+-----------+----------+-------+  IJV                        Yes        Yes                         +----------+------------+---------+-----------+----------+-------+  Subclavian     Full        Yes        Yes                         +----------+------------+---------+-----------+----------+-------+  Axillary       Full        Yes        Yes                         +----------+------------+---------+-----------+----------+-------+  Brachial       Full        Yes        Yes                         +----------+------------+---------+-----------+----------+-------+  Radial         Full                                               +----------+------------+---------+-----------+----------+-------+  Ulnar          Full                                               +----------+------------+---------+-----------+----------+-------+  Cephalic     Partial                                     Chronic  +----------+------------+---------+-----------+----------+-------+  Basilic        Full                                               +----------+------------+---------+-----------+----------+-------+  Summary:  Right: No evidence of thrombosis in the subclavian.  Left: No evidence of deep vein thrombosis in the upper extremity. 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 Chronic partial thrombus noted in a small portion of the cephalic vein from [facility] to mid upper arm.   *See table(s) above for measurements and observations.  Diagnosing physician: Harold Barban MD Electronically signed by Harold Barban MD on 07/20/2018 at 9:11:19 AM.    Final     Anti-infectives: Anti-infectives (From admission, onward)   Start     Dose/Rate Route Frequency Ordered Stop   07/19/18 1400  piperacillin-tazobactam (ZOSYN) IVPB 3.375 g     3.375 g 12.5 mL/hr over 240 Minutes Intravenous Every 8 hours 07/19/18 0651     07/19/18 0600  piperacillin-tazobactam (ZOSYN) IVPB 3.375 g  Status:  Discontinued     3.375 g 12.5 mL/hr over 240 Minutes Intravenous Every 8 hours 07/19/18 0442 07/19/18 0447   07/19/18 0500  piperacillin-tazobactam (ZOSYN) IVPB 3.375 g     3.375 g 100 mL/hr over 30 Minutes Intravenous  Once 07/19/18 0447 07/19/18 1445       Assessment/Plan Chronic anemia Severe malnutrition - check prealbumin, was 5.7 on 5/4 New CVA with L sided weakness - multiple foci seen on MRI Thrombus of thoracic aorta - needs cards evaluation, ok for anticoagulation from a surgery standpoint, although will have to closely watch for GI bleeding   Neglected right colon mass with possible extension to involve the duodenum - patient is oriented x3, but it seems even with the interpretor that he does not have good insight and full understanding of this situation.  When asked if he wants treatment he just refers back to his complaints with [employer] food. -the patient still needs a tissue diagnosis along with treatment now for his aortic thrombus, CVAs, etc.  He has severe PCM.  If the patient becomes agreeable to treatment then we would recommend starting him on TNA.  If he is found to have capacity and but chooses to not pursue treatment, then I would not recommend starting TNA, but having palliative care evaluate him for hospice care. -given surgery is not planned at this time, we will sign off for now.  Please call us back if patient changes his mind about wanting to pursue  treatment.   ID -zosyn 5/17>> FEN -ok for diet from surgery standpoint, likely needs SLP eval  VTE -SCDs Foley -none   LOS: 2 days    Henreitta Cea , [facility]  07/21/2018, 8:54 AM Pager: [phone]

## 2018-07-21 NOTE — Progress Notes (Signed)
Per MD after discussion with palliative care and family, pt agreeable to CT scan (refused twice yesterday). I called CT scan and discussed travel info; they will come get him as soon as they are able.

## 2018-07-21 NOTE — Progress Notes (Signed)
Nutrition Follow-up  RD working remotely.  DOCUMENTATION CODES:   Underweight  INTERVENTION:   -Continue Ensure Enlive po TID, each supplement provides 350 kcal and 20 grams of protein -TPN management per pharmacy  NUTRITION DIAGNOSIS:   Increased nutrient needs related to chronic illness(colonic mass) as evidenced by estimated needs.  Ongoing  GOAL:   Patient will meet greater than or equal to 90% of their needs  Progressing  MONITOR:   PO intake, Supplement acceptance, Labs, Weight trends, Skin, I & O's  REASON FOR ASSESSMENT:   Consult New TPN/TNA  ASSESSMENT:   Brandon Landry is a 53 y.o. male with medical history significant of depression, iron deficiency anemia, tobacco abuse, who presents with generalized weakness.  5/18- s/p BSE- advanced to regular diet with thin liquids 5/19- PICC placed, TPN initiated  Reviewed I/O's:+480 ml x 24 hours and -71 ml since admission  Pt with large fungating mass in the ascending colon; refused surgical intervention during last admission. Per general surgery notes, no indications for surgery at this time, due to poor nutritional status. General surgery recommending TPN to improve nutritional status.   Case discussed with pharmacist via secure chat, who reports plan to start TPN today. RD expressed concern over poor nutritional status with pt. Due to pt with functioning GI tract and eating PO's, pt would ideally receive supplemental support via enteral nutrition. However, unsure if pt would agree to a feeding tube/cortrak tube at this time (also note that cortrak service is not available until Friday). Palliative care has been following pt to discuss goals of care and pt agreed to TPN. Pt would prefer to be transported to [place] to receive medical care, as he has more medical resources there than in the [place].   Meal completion documented at 100%, however, notes indicate that pt has been refusing to eat, as he wants to eat [place]. Food  preferences include soup. He accepted an Ensure supplement last night.   Per pharmacy note, plan to initiate TPN at 30 ml/hr (provides 747 kcals and 44 grams protein, meeting 40% of estimated kcal needs and 42% of estimated protein needs). Plan to advance rate slowly due to high refeeding risk.   Labs reviewed: Na: 133, Mg WDL,  CBGS: 71 (inpateint orders for glycemic control are 0-9 units insulin aspart every 6 hours).  Diet Order:   Diet Order            Diet regular Room service appropriate? Yes with Assist; Fluid consistency: Thin  Diet effective now              EDUCATION NEEDS:   No education needs have been identified at this time  Skin:  Skin Assessment: Reviewed RN Assessment  Last BM:  Unknown  Height:   Ht Readings from Last 1 Encounters:  07/06/18 5' 8.11" (1.73 m)    Weight:   Wt Readings from Last 1 Encounters:  07/21/18 49.7 kg    Ideal Body Weight:  70 kg  BMI:  Body mass index is 16.61 kg/m.  Estimated Nutritional Needs:   Kcal:  1850-2050  Protein:  105-120 grams  Fluid:  > 1.8 L    Brandon Landry, RD, LDN, [place] Registered Dietitian II Certified Diabetes Care and Education Specialist Pager: [phone] After hours Pager: [phone]

## 2018-07-21 NOTE — Progress Notes (Signed)
PROGRESS NOTE    Brandon Landry  IWP:[id number] DOB: 25-Mar-1965 DOA: 07/19/2018 PCP: Patient, No Pcp Per   Brief Narrative:  Per HPI: Patient is a53 y.o.malewho was hospitalized from 5/4 to 5/6 after he was found to have severe microcytic anemia with a hemoglobin of 3.1, he was transfused PRBC, CT scan of the abdomen on 5/4 showed a large fungating mass in the ascending colon with contained perforation and adjacent abscess formation, general surgery was subsequently consulted-surgical intervention was contemplated-however patient refused and was subsequently discharged home on 5/6. He returned [facility] on 5/16 with generalized weakness, and was found to have a hemoglobin of 3.2. He was transfused 2 units of PRBC, and transferred to [facility]. He now wants to pursue surgical options.   General surgery has signed off for now and patient requests to go to [place] for all of his medical care which is being investigated by palliative care.  Regardless, PICC line has been placed and TPN will be initiated for nutritional support given severe malnourishment as he will require a better nutritional status if he is to undergo further aggressive intervention with therapy in the [place] or [place].    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  Initially, there appeared to be a thrombus in the ascending aorta versus dissection.  He was offered CT chest with angiogram on 5/18 and refused, but was agreeable on 5/19.  This continues to demonstrate some thrombus versus vegetation and will be further assessed by neurology today.  Assessment & Plan:   Principal Problem:   Symptomatic anemia Active Problems:   Iron deficiency anemia due to chronic blood loss   Protein-calorie malnutrition, severe (HCC)   Lower extremity edema   Tobacco use disorder   Hypokalemia   Giant mass of hepatic flexure of colon - probable perforated  colon cancer   Left arm weakness   Left arm swelling   Palliative care encounter   Cerebral embolism with cerebral infarction  Severe microcytic anemia: Secondary to chronic blood loss and acute illness. No overt bleeding noted. Hemoglobin stable after 2 units of PRBC at [facility]. Continue supportive care, recheck CBC tomorrow.  This is currently downtrending and he may require further PRBC transfusion by a.m. if hemoglobin less than 7.  Fungating mass in the ascending colon with probable perforation and adjacent abscess: Abdominal exam appears benign-discussed with general surgery-plans are to repeat CT of the abdomen and chest. Discussed with General Surgery PA and they plan to sign off for now as patient is not very cooperative and would like to pursue further measures in [place].  TPN started for severe malnutrition today. -We will plan to maintain on Zosyn empirically for now given leukocytosis and questionable findings on imaging that are difficult to discern  Left arm weakness:Appears to have isolated left arm weakness-this is in a background of severe weakness and deconditioning-Brain MRI with embolic CVA. Questionable source of thrombus versus vegetations and aorta which will be further investigated by neurology.  Heparin being avoided at this time especially due to the initial concern for aortic dissection.  Left arm swelling/lower extremity swelling: Suspect this is secondary to hypoalbuminemia-doppler studies negative for DVT.  Hypokalemia:Repleted. Will check AM levels.  Severe malnutrition: Albumin of 1.5-we will consult nutrition services. Prealbumin is low.  Patient started on TPN after PICC line placement today.  Ethics/palliative care:Unfortunate 53 year old Asian male with what appears to be advanced  colon cancer-he appears very frail and debilitated/deconditioned. Not sure if surgery will be curative-prudent to start involving palliative care-as I suspect  his prognosis is not that good.   DVT prophylaxis:SCDs Code Status: Full, but now with living will Family Communication:  Palliative has discussed with sister in [place] Disposition Plan:  Continue TPN for nutritional support and patient will likely require SNF placement on discharge as he awaits further arrangements to go back to [place] for further medical care.  Appreciate further neurology recommendations given new imaging study of CT chest with angiogram on 5/19.  No immediate plans for surgery.   Consultants:   General Surgery  Palliative  Neurology  Procedures:   None  Antimicrobials:  Anti-infectives (From admission, onward)   Start     Dose/Rate Route Frequency Ordered Stop   07/19/18 1400  piperacillin-tazobactam (ZOSYN) IVPB 3.375 g     3.375 g 12.5 mL/hr over 240 Minutes Intravenous Every 8 hours 07/19/18 0651     07/19/18 0600  piperacillin-tazobactam (ZOSYN) IVPB 3.375 g  Status:  Discontinued     3.375 g 12.5 mL/hr over 240 Minutes Intravenous Every 8 hours 07/19/18 0442 07/19/18 0447   07/19/18 0500  piperacillin-tazobactam (ZOSYN) IVPB 3.375 g     3.375 g 100 mL/hr over 30 Minutes Intravenous  Once 07/19/18 0447 07/19/18 1445        Subjective: Patient seen and evaluated today with no new acute complaints or concerns. No acute concerns or events noted overnight.  He is much more cooperative and conversational today.  He is agreeing to multiple interventions to include further CT chest imaging and PICC line placement for nutrition.  He understands that we will do our best to provide him food that he likes and that we will do everything possible to try to get him to [place] for further medical care.  He appears to understand his clinical situation.  Objective: Vitals:   07/20/18 1453 07/20/18 2201 07/21/18 0500 07/21/18 0543  BP: 102/83 107/83  113/84  Pulse: 94 81  79  Resp: 20   16  Temp: 97.6 F (36.4 C) 97.6 F (36.4 C)  98.2 F (36.8 C)    TempSrc: Axillary Oral  Oral  SpO2: 100% 100%  100%  Weight:   49.7 kg    No intake or output data in the 24 hours ending 07/21/18 1428 Filed Weights   07/19/18 0700 07/20/18 0500 07/21/18 0500  Weight: 52.3 kg 51.5 kg 49.7 kg    Examination:  General exam: Appears calm and comfortable, thin Respiratory system: Clear to auscultation. Respiratory effort normal. Cardiovascular system: S1 & S2 heard, RRR. No JVD, murmurs, rubs, gallops or clicks. No pedal edema. Gastrointestinal system: Abdomen is nondistended, soft and nontender. No organomegaly or masses felt. Normal bowel sounds heard. Central nervous system: Alert and oriented. No focal neurological deficits. Extremities: Symmetric 5 x 5 power. Skin: No rashes, lesions or ulcers Psychiatry: Judgement and insight appear normal. Mood & affect appropriate.     Data Reviewed: I have personally reviewed following labs and imaging studies  CBC: Recent Labs  Lab 07/19/18 0534 07/19/18 0953 07/20/18 0337 07/21/18 0300  WBC 26.4* 26.2* 25.0* 21.6*  NEUTROABS  --  22.7*  --   --   HGB 8.1* 7.6* 7.6* 7.2*  HCT 25.2* 23.7* 23.6* 22.0*  MCV 83.2 82.6 81.7 81.8  PLT 372 341 334 885   Basic Metabolic Panel: Recent Labs  Lab 07/19/18 0534 07/20/18 0337 07/21/18 0300  NA 135  132* 133*  K 3.2* 3.9 3.6  CL 104 106 104  CO2 19* 20* 22  GLUCOSE 84 85 97  BUN 17 12 9   CREATININE 0.50* 0.54* 0.48*  CALCIUM 7.4* 7.2* 7.3*  MG 2.0 1.9 2.0  PHOS  --  2.6  --    GFR: Estimated Creatinine Clearance: 75.1 mL/min (A) (by C-G formula based on SCr of 0.48 mg/dL (L)). Liver Function Tests: Recent Labs  Lab 07/19/18 0534 07/20/18 0337  AST 13* 10*  ALT 11 9  ALKPHOS 83 74  BILITOT 1.7* 1.3*  PROT 5.1* 5.0*  ALBUMIN 1.5* 1.3*   No results for input(s): LIPASE, AMYLASE in the last 168 hours. No results for input(s): AMMONIA in the last 168 hours. Coagulation Profile: Recent Labs  Lab 07/19/18 0534  INR 1.3*   Cardiac  Enzymes: No results for input(s): CKTOTAL, CKMB, CKMBINDEX, TROPONINI in the last 168 hours. BNP (last 3 results) No results for input(s): PROBNP in the last 8760 hours. HbA1C: No results for input(s): HGBA1C in the last 72 hours. CBG: Recent Labs  Lab 07/19/18 0755 07/20/18 0800 07/21/18 0754  GLUCAP 85 84 71   Lipid Profile: No results for input(s): CHOL, HDL, LDLCALC, TRIG, CHOLHDL, LDLDIRECT in the last 72 hours. Thyroid Function Tests: No results for input(s): TSH, T4TOTAL, FREET4, T3FREE, THYROIDAB in the last 72 hours. Anemia Panel: Recent Labs    07/19/18 0534  VITAMINB12 687  FOLATE 9.7  FERRITIN 12*  TIBC 153*  IRON 101  RETICCTPCT 5.4*   Sepsis Labs: Recent Labs  Lab 07/21/18 0300  PROCALCITON 0.11    Recent Results (from the past 240 hour(s))  Culture, blood (Routine X 2) w Reflex to ID Panel     Status: None (Preliminary result)   Collection Time: 07/19/18  5:34 AM  Result Value Ref Range Status   Specimen Description BLOOD RIGHT ARM  Final   Special Requests   Final    BOTTLES DRAWN AEROBIC ONLY Blood Culture adequate volume   Culture   Final    NO GROWTH 2 DAYS Performed at [facility], [place] [address], [place], [place] [address]    Report Status PENDING  Incomplete  Culture, blood (Routine X 2) w Reflex to ID Panel     Status: None (Preliminary result)   Collection Time: 07/19/18  5:34 AM  Result Value Ref Range Status   Specimen Description BLOOD LEFT ANTECUBITAL  Final   Special Requests   Final    BOTTLES DRAWN AEROBIC ONLY Blood Culture results may not be optimal due to an inadequate volume of blood received in culture bottles   Culture   Final    NO GROWTH 2 DAYS Performed at [facility], [place] [address] [place], [place] [address]    Report Status PENDING  Incomplete         Radiology Studies: Ct Chest W Contrast  Result Date: 07/20/2018 CLINICAL DATA:  Colon cancer. EXAM: CT CHEST, ABDOMEN, AND PELVIS WITH CONTRAST  TECHNIQUE: Multidetector CT imaging of the chest, abdomen and pelvis was performed following the standard protocol during bolus administration of intravenous contrast. CONTRAST:  165mL OMNIPAQUE IOHEXOL 300 MG/ML  SOLN COMPARISON:  07/06/2018 FINDINGS: CT CHEST FINDINGS Cardiovascular: The heart size is normal. Calcification in the LAD coronary artery. There is a 2.5 cm low-attenuation filling defect arising from the right lateral wall of the ascending thoracic aorta concerning for thrombus. Mediastinum/Nodes: Normal appearance of the thyroid gland. The trachea appears  patent and is midline. Normal appearance of the esophagus. No mediastinal or hilar adenopathy. No axillary or supraclavicular adenopathy. Lungs/Pleura: Small bilateral pleural effusions, similar. Moderate changes of centrilobular and paraseptal emphysema. Scar like density identified in the right apex. No suspicious pulmonary nodule or mass identified. Musculoskeletal: No aggressive lytic or sclerotic bone lesions. CT ABDOMEN PELVIS FINDINGS Hepatobiliary: Low-density structure in lateral segment of left lobe of liver is unchanged measuring 9 mm, image 63/3. No new liver abnormality identified. Pancreas: Unremarkable. No pancreatic ductal dilatation or surrounding inflammatory changes. Spleen: Normal in size. Small peripheral wedge-shaped areas of low attenuation concerning for splenic infarcts. Adrenals/Urinary Tract: Normal appearance of the adrenal glands. There are several small right kidney lesions, too small to reliably characterize. No hydronephrosis bilaterally. Urinary bladder is negative. Stomach/Bowel: The stomach is nondistended. The proximal small bowel loops have a normal caliber. Abnormal bowel wall edema involving the terminal ileum is identified, similar to previous exam. Large necrotic mass within the right abdomen centered around the ascending colon is again noted measuring 10.2 by 8.6 by 10.1 cm (volume = 460 cm^3). Previously this  measured 9.9 x 9.9 by 9.6 cm (volume = 490 cm^3). As noted previously tumor appears to involve the second and third portions of the duodenum. Distal colon unremarkable. Vascular/Lymphatic: Aortic atherosclerosis without aneurysm. No abdominopelvic adenopathy. Reproductive: Prostate is unremarkable. Other: Diffuse abdominopelvic ascites is identified. Musculoskeletal: No acute or significant osseous findings. IMPRESSION: 1. Similar size of large fungating mass within the right hemiabdomen centered around the ascending colon. Invasion of the second and third portions of the duodenum noted. 2. Ascites. 3. Small bilateral pleural effusions. Mural filling defect along the right lateral wall of the ascending thoracic aorta is concerning for thrombus, image 44/6. 4. Small splenic infarcts. Electronically Signed   By: Kerby Moors M.D.   On: 07/20/2018 09:12   Mr Jeri Cos BT Contrast  Result Date: 07/20/2018 CLINICAL DATA:  53 y/o M; left arm weakness. Possible colon cancer. EXAM: MRI HEAD WITHOUT AND WITH CONTRAST TECHNIQUE: Multiplanar, multiecho pulse sequences of the brain and surrounding structures were obtained without and with intravenous contrast. CONTRAST:  5 cc Gadavist. COMPARISON:  None. FINDINGS: Brain: Numerous punctate foci of reduced diffusion are present throughout the right greater than left posterior frontal lobes, bilateral parietal lobes, right posterior temporal lobe, bilateral occipital lobes, and the right cerebellar hemisphere. Foci of reduced diffusion demonstrate T2 FLAIR hyperintensity. No hemorrhage. After administration of intravenous contrast there is no abnormal enhancement. No extra-axial collection, hydrocephalus, mass effect, or herniation. Vascular: Normal flow voids. Skull and upper cervical spine: Normal marrow signal. Sinuses/Orbits: Negative. Other: None. IMPRESSION: 1. Numerous punctate foci of acute/early subacute infarction are present throughout the right greater than left  posterior cerebral hemispheres and the right cerebellar hemisphere. Multiple vascular territories suggests embolic etiology. No hemorrhage or mass effect. 2. No enhancing intracranial metastasis identified. These results will be called to the ordering clinician or representative by the Radiologist Assistant, and communication documented in the PACS or zVision Dashboard. Electronically Signed   By: Kristine Garbe M.D.   On: 07/20/2018 01:18   Ct Abdomen Pelvis W Contrast  Result Date: 07/20/2018 CLINICAL DATA:  Colon cancer. EXAM: CT CHEST, ABDOMEN, AND PELVIS WITH CONTRAST TECHNIQUE: Multidetector CT imaging of the chest, abdomen and pelvis was performed following the standard protocol during bolus administration of intravenous contrast. CONTRAST:  169mL OMNIPAQUE IOHEXOL 300 MG/ML  SOLN COMPARISON:  07/06/2018 FINDINGS: CT CHEST FINDINGS Cardiovascular: The heart size is normal.  Calcification in the LAD coronary artery. There is a 2.5 cm low-attenuation filling defect arising from the right lateral wall of the ascending thoracic aorta concerning for thrombus. Mediastinum/Nodes: Normal appearance of the thyroid gland. The trachea appears patent and is midline. Normal appearance of the esophagus. No mediastinal or hilar adenopathy. No axillary or supraclavicular adenopathy. Lungs/Pleura: Small bilateral pleural effusions, similar. Moderate changes of centrilobular and paraseptal emphysema. Scar like density identified in the right apex. No suspicious pulmonary nodule or mass identified. Musculoskeletal: No aggressive lytic or sclerotic bone lesions. CT ABDOMEN PELVIS FINDINGS Hepatobiliary: Low-density structure in lateral segment of left lobe of liver is unchanged measuring 9 mm, image 63/3. No new liver abnormality identified. Pancreas: Unremarkable. No pancreatic ductal dilatation or surrounding inflammatory changes. Spleen: Normal in size. Small peripheral wedge-shaped areas of low attenuation  concerning for splenic infarcts. Adrenals/Urinary Tract: Normal appearance of the adrenal glands. There are several small right kidney lesions, too small to reliably characterize. No hydronephrosis bilaterally. Urinary bladder is negative. Stomach/Bowel: The stomach is nondistended. The proximal small bowel loops have a normal caliber. Abnormal bowel wall edema involving the terminal ileum is identified, similar to previous exam. Large necrotic mass within the right abdomen centered around the ascending colon is again noted measuring 10.2 by 8.6 by 10.1 cm (volume = 460 cm^3). Previously this measured 9.9 x 9.9 by 9.6 cm (volume = 490 cm^3). As noted previously tumor appears to involve the second and third portions of the duodenum. Distal colon unremarkable. Vascular/Lymphatic: Aortic atherosclerosis without aneurysm. No abdominopelvic adenopathy. Reproductive: Prostate is unremarkable. Other: Diffuse abdominopelvic ascites is identified. Musculoskeletal: No acute or significant osseous findings. IMPRESSION: 1. Similar size of large fungating mass within the right hemiabdomen centered around the ascending colon. Invasion of the second and third portions of the duodenum noted. 2. Ascites. 3. Small bilateral pleural effusions. Mural filling defect along the right lateral wall of the ascending thoracic aorta is concerning for thrombus, image 44/6. 4. Small splenic infarcts. Electronically Signed   By: Kerby Moors M.D.   On: 07/20/2018 09:12   Ct Angio Chest Aorta W/cm &/or Wo/cm  Result Date: 07/21/2018 CLINICAL DATA:  53 year old male with history of embolic strokes. Possible thrombus in the ascending thoracic aorta noted on prior non gated chest CT. Follow-up study. EXAM: CT ANGIOGRAPHY CHEST WITH CONTRAST TECHNIQUE: Multidetector CT imaging of the chest was performed using the standard protocol as well as cardiac gating during bolus administration of intravenous contrast. Multiplanar CT image reconstructions  and MIPs were obtained to evaluate the vascular anatomy. CONTRAST:  115mL OMNIPAQUE IOHEXOL 350 MG/ML SOLN COMPARISON:  Chest CT 05/18/20209. FINDINGS: Cardiovascular: Heart size is enlarged with mild left ventricular dilatation. There is also some mild myocardial thinning in the apex of the left ventricle which appears rounded, suggesting sequela of prior distal LAD territory myocardial infarction. There is no significant pericardial fluid, thickening or pericardial calcification. Aortic atherosclerosis, as well as calcified atherosclerotic plaque in the left anterior descending coronary artery. Several areas of mural thickening and intimal irregularity are noted in the ascending thoracic aorta. The first of these is at and immediately above the level of the sino-tubular junction best appreciated on axial image 100 of series 14, where this focal mural thickening of the posterolateral wall of the ascending thoracic aorta on the right side extends into the lumen over a distance of approximately 11 mm craniocaudally (coronal image 31 of series 18). The second intimal irregularity is best appreciated on axial image 60 of  series 14 also in the proximal ascending thoracic aorta slightly cranial to the previously described lesion, without much extension into the lumen. The final lesion is much larger, best appreciated on axial image 24 of series 14 and coronal image 63 of series 10 where a focal area of mural thickening along the right lateral wall of the ascending thoracic aorta measures up to approximately 10 x 8 mm, and a pedunculated portion of the lesion extends into the lumen cephalad over distance of approximately 3.3 cm toward the proximal aortic arch. Mediastinum/Nodes: No pathologically enlarged mediastinal or hilar lymph nodes. Esophagus is unremarkable in appearance. No axillary lymphadenopathy. Lungs/Pleura: Small bilateral pleural effusions lying dependently with some associated passive subsegmental  atelectasis in the lower lobes of the lungs bilaterally. No acute consolidative airspace disease. Diffuse bronchial wall thickening with moderate centrilobular and paraseptal emphysema. No definite suspicious appearing pulmonary nodules or masses are noted. Upper Abdomen: Please refer to dedicated CT the abdomen and pelvis from yesterday for full description of findings beneath the diaphragm. Musculoskeletal: There are no aggressive appearing lytic or blastic lesions noted in the visualized portions of the skeleton. Review of the MIP images confirms the above findings. IMPRESSION: 1. Three focal areas of mural thickening and intimal irregularity in the ascending thoracic aorta, as detailed above. These are highly unusual. Based on the multiplicity of these lesions, and the patient's history of large colonic mass which shows signs of pending perforation, these are strongly favored to represent infectious vegetations/thrombus. 2. Aortic atherosclerosis, in addition to left anterior descending coronary artery disease. Please note that although the presence of coronary artery calcium documents the presence of coronary artery disease, the severity of this disease and any potential stenosis cannot be assessed on this non-gated CT examination. Assessment for potential risk factor modification, dietary therapy or pharmacologic therapy may be warranted, if clinically indicated. 3. Cardiomegaly with mild left ventricular dilatation. 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 4. Small bilateral pleural effusions lying dependently. 5. Diffuse bronchial wall thickening with moderate centrilobular and paraseptal emphysema; imaging findings suggestive of underlying COPD. Critical Value/emergent results were called by telephone at the time of interpretation on 07/21/2018 at 2:04 pm to [place], who verbally acknowledged  these results. Aortic Atherosclerosis (ICD10-I70.0) and Emphysema (ICD10-J43.9). Electronically Signed   By: Vinnie Langton M.D.   On: 07/21/2018 14:05   [place] Ekg Site Rite  Result Date: 07/21/2018 If Site Rite image not attached, placement could not be confirmed due to current cardiac rhythm.       Scheduled Meds:  sodium chloride   Intravenous Once   feeding supplement (ENSURE ENLIVE)  237 mL Oral TID BM   insulin aspart  0-9 Units Subcutaneous Q6H   nicotine  21 mg Transdermal Daily   [START ON 07/24/2018] thiamine  100 mg Oral Daily   thiamine  500 mg Oral TID   Continuous Infusions:  sodium chloride Stopped (07/19/18 1445)   sodium chloride 50 mL/hr at 07/21/18 0717   piperacillin-tazobactam (ZOSYN)  IV 3.375 g (07/21/18 0622)   sodium phosphate  Dextrose 5% IVPB     TPN ADULT (ION)       LOS: 2 days    Time spent: 30 minutes    Nandita Mathenia Darleen Crocker, DO [facility] Pager [phone]  If 7PM-7AM, please contact night-coverage [email or web address] Password [email or web address] 07/21/2018, 2:28 PM

## 2018-07-21 NOTE — Progress Notes (Signed)
Physical Therapy Treatment Patient Details Name: Brandon Landry MRN: [id number] DOB: 1965-09-21 Today's Date: 07/21/2018    History of Present Illness Pt is a 53 y/o male admitted secondary to symptomatic anemia. CT of abdomen revealed right colon mass with possible extension to involve the duodenum. Pt also with LUE weakness and MRI revealed numerous punctate foci of acute/early subacute infarction present throughout the right greater than left posterior cerebral hemispheres and the right cerebellar hemisphere. PMH includes depression, iron deficiency anemia, and tobacco abuse.     PT Comments    Pt ambulated 150' with RW and min-guard A, fatigued after 125' needing closer guarding. Pt requiring min A for sit to stand due to weakness. Continues to be unsafe to mobilize alone, recommend SNF. PT will continue to follow.    Follow Up Recommendations  SNF;Supervision/Assistance - 24 hour     Equipment Recommendations  Rolling walker with 5" wheels    Recommendations for Other Services OT consult     Precautions / Restrictions Precautions Precautions: Fall Restrictions Weight Bearing Restrictions: No    Mobility  Bed Mobility Overal bed mobility: Needs Assistance Bed Mobility: Sit to Supine     Supine to sit: HOB elevated;Supervision     General bed mobility comments: pt able to get LE's off bed and sit up without physical assist, needed increased time  Transfers Overall transfer level: Needs assistance Equipment used: Rolling walker (2 wheeled) Transfers: Sit to/from Stand Sit to Stand: Min assist         General transfer comment: pt prefers to push up on RW despite vc's. min A for power up  Ambulation/Gait Ambulation/Gait assistance: Min guard Gait Distance (Feet): 150 Feet Assistive device: Rolling walker (2 wheeled) Gait Pattern/deviations: Step-through pattern;Decreased stride length Gait velocity: decr Gait velocity interpretation: <1.8 ft/sec, indicate of risk  for recurrent falls General Gait Details: min-guard A due to pt unsteady as he fatigued   Marine scientist Rankin (Stroke Patients Only) Modified Rankin (Stroke Patients Only) Pre-Morbid Rankin Score: Slight disability Modified Rankin: Moderately severe disability     Balance Overall balance assessment: Needs assistance;History of Falls Sitting-balance support: No upper extremity supported;Feet supported Sitting balance-Leahy Scale: Good     Standing balance support: Bilateral upper extremity supported Standing balance-Leahy Scale: Poor Standing balance comment: reliant on RW                            Cognition Arousal/Alertness: Awake/alert Behavior During Therapy: Flat affect Overall Cognitive Status: Impaired/Different from baseline Area of Impairment: Safety/judgement;Awareness;Problem solving                         Safety/Judgement: Decreased awareness of safety;Decreased awareness of deficits Awareness: Emergent Problem Solving: Requires verbal cues;Slow processing        Exercises      General Comments General comments (skin integrity, edema, etc.): interpreter ipad in room broken (taken to RN desk where it was labeled). Pt able to follow simple commands and knows a good bit of English, enough for mobility today      Pertinent Vitals/Pain Pain Assessment: Faces Faces Pain Scale: No hurt    Home Living                      Prior Function  PT Goals (current goals can now be found in the care plan section) Acute Rehab PT Goals Patient Stated Goal: none stated PT Goal Formulation: With patient Time For Goal Achievement: 08/03/18 Potential to Achieve Goals: Good Progress towards PT goals: Progressing toward goals    Frequency    Min 3X/week      PT Plan Current plan remains appropriate    Co-evaluation              AM-PAC PT "6 Clicks" Mobility    Outcome Measure  Help needed turning from your back to your side while in a flat bed without using bedrails?: A Little Help needed moving from lying on your back to sitting on the side of a flat bed without using bedrails?: A Little Help needed moving to and from a bed to a chair (including a wheelchair)?: A Little Help needed standing up from a chair using your arms (e.g., wheelchair or bedside chair)?: A Little Help needed to walk in hospital room?: A Lot Help needed climbing 3-5 steps with a railing? : Total 6 Click Score: 15    End of Session Equipment Utilized During Treatment: Gait belt Activity Tolerance: Patient tolerated treatment well Patient left: in chair;with chair alarm set;with call bell/phone within reach Nurse Communication: Mobility status PT Visit Diagnosis: Other abnormalities of gait and mobility (R26.89);Unsteadiness on feet (R26.81);Muscle weakness (generalized) (M62.81)     Time: 6834-1962 PT Time Calculation (min) (ACUTE ONLY): 17 min  Charges:  $Gait Training: 8-22 mins                     Leighton Roach, PT  Acute Rehab Services  Pager [phone] Office [place] 07/21/2018, 4:26 PM

## 2018-07-21 NOTE — Progress Notes (Signed)
Daily Progress Note   Patient Name: Brandon Landry       Date: 07/21/2018 DOB: 1965/11/01  Age: 53 y.o. MRN#: [id number] Attending Physician: Rodena Goldmann, DO Primary Care Physician: Patient, No Pcp Per Admit Date: 07/19/2018  Reason for Consultation/Follow-up: Establishing goals of care and Psychosocial/spiritual support  Subjective: Discussion with patient, attending physician, APS case worker Brandon Landry) and myself.  Brandon Landry repeatedly expresses concerns over money and paying his medical bills.  He wants to return to [place] for medical care because he feels he can pay for it there.  We had a careful but detailed dialogue regarding the mass in his colon, his GI bleeding/ blood loss, his very poor nutrition, the small strokes in his brain and the clot with possible dissection in his aorta (heart).  He wants aggressive medical intervention and is agreeable to TPN as well as imaging of his chest to evaluate for aortic thrombus +/- dissecting aorta.    He apologized for refusing chest imaging yesterday.  I understand from [place] and the patient that he is a very picky eater and desires [place] food.  He asks for spaghetti.  He loves soups from the soup kitchen (our daily bread).  We discussed Advanced Directives.  If Brandon Landry arrests and dies he does not want to be resuscitated to live on machines.  However if he does not arrest he desires full scope treatment and on going aggressive medical care.  Brandon Landry was coherent and agreeable.  Very different from yesterday.  He still became tangential at times - but was able to express understanding of his medical issues and the desire for aggressive treatment.   Assessment: Malnourished, anemia, deconditioned [place] male with colonic mass, numerous  small strokes and possible aortic thrombus.   Frustrated by being in the hospital - loss of control -  and having difficulty understanding what is happening.   Patient Profile/HPI:  53 y.o.Koreanmalewho speaks little englishwith past medical history of 9.9 x 9.9 cm fungating colonic mass with possible abscess and contained perforation that was originally imaged on 07/06/2018, severe malnutrition, and tobacco usewho was [facility]with severe anemia (Hgb of 3.2).When the mass was first imaged the patient had presented with severe anemia as well. During the admission from 5/4 - 5/6 he received blood transfusion  and surgery consultation. He refused surgery and was discharged with a Hgb of 7.7.  He returned to the ER this admission requesting surgery.       Length of Stay: 2  Current Medications: Scheduled Meds:  . sodium chloride   Intravenous Once  . feeding supplement (ENSURE ENLIVE)  237 mL Oral TID BM  . multivitamin with minerals  1 tablet Oral Daily  . nicotine  21 mg Transdermal Daily  . [START ON 07/24/2018] thiamine  100 mg Oral Daily  . thiamine  500 mg Oral TID    Continuous Infusions: . sodium chloride Stopped (07/19/18 1445)  . sodium chloride 50 mL/hr at 07/21/18 0717  . piperacillin-tazobactam (ZOSYN)  IV 3.375 g (07/21/18 0622)    PRN Meds: sodium chloride, acetaminophen **OR** acetaminophen, ondansetron **OR** ondansetron (ZOFRAN) IV  Physical Exam        Cachectically thin [place] male, awake, alert, cooperative NAD.  Vital Signs: BP 113/84 (BP Location: Right Arm)   Pulse 79   Temp 98.2 F (36.8 C) (Oral)   Resp 16   Wt 49.7 kg   SpO2 100%   BMI 16.61 kg/m  SpO2: SpO2: 100 % O2 Device: O2 Device: Room Air O2 Flow Rate:    Intake/output summary: No intake or output data in the 24 hours ending 07/21/18 1046 LBM: Last BM Date: 07/06/18 Baseline Weight: Weight: 52.3 kg Most recent weight: Weight: 49.7 kg        Palliative Assessment/Data:  50%    Flowsheet Rows     Most Recent Value  Intake Tab  Referral Department  Hospitalist  Unit at Time of Referral  Med/Surg Unit  Palliative Care Primary Diagnosis  Cancer  Date Notified  07/19/18  Palliative Care Type  New Palliative care  Reason for referral  Clarify Goals of Care  Date of Admission  07/19/18  Date first seen by Palliative Care  07/19/18  # of days Palliative referral response time  0 Day(s)  # of days IP prior to Palliative referral  0  Clinical Assessment  Psychosocial & Spiritual Assessment  Palliative Care Outcomes      Patient Active Problem List   Diagnosis Date Noted  . Cerebral embolism with cerebral infarction 07/21/2018  . Left arm weakness 07/19/2018  . Left arm swelling   . Palliative care encounter   . Non-English speaking patient Brandon Landry) 07/08/2018  . 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 Iron deficiency anemia due to chronic blood loss 07/06/2018  . Symptomatic anemia 07/06/2018  . Protein-calorie malnutrition, severe ([place]) 07/06/2018  . Chronic systolic CHF (congestive heart failure) ([place]) 07/06/2018  . Lower extremity edema 07/06/2018  . Leukocytosis 07/06/2018  . Abdominal pain 07/06/2018  . Psychosocial stressors 07/06/2018  . Tobacco use disorder 07/06/2018  . Hypokalemia 07/06/2018  . Hypomagnesemia 07/06/2018    Palliative Care Plan    Recommendations/Plan:  Patient agreeable to TPN with the intention of having surgery preferably in [place], but if not then here in the [place].  Patient agreeable to CT Chest to evaluate aortic thrombus v dissection  Patient expressed desire for aggressive medical care but no life support if he arrests and dies.  Appreciate APS Case worker Brandon Landry.  Goals of Care and Additional Recommendations:  Limitations on Scope of Treatment: Full Scope Treatment  Code Status:  DNR  Prognosis:    Unable to determine   Discharge Planning:  To Be Determined  Likely  SNF with TPN  Care plan was discussed with Attending MD, APS, Nephew Brandon Landry in [place] and "[place]"  Thank you for allowing the Palliative Medicine Team to assist in the care of this patient.  Total time spent:  75 min.  Time in 9:00 Time out 10:15     Greater than 50%  of this time was spent counseling and coordinating care related to the above assessment and plan.  Florentina Jenny, PA-C Palliative Medicine  Please contact Palliative MedicineTeam phone at [phone] for questions and concerns between 7 am - 7 pm.   Please see AMION for individual provider pager numbers.

## 2018-07-21 NOTE — Progress Notes (Signed)
[place] NOTE   Pharmacy Consult for TPN Indication: Severe malnutrition; R-colon mass extending into duodenum   Patient Measurements: Weight: 109 lb 9.1 oz (49.7 kg)   Body mass index is 16.61 kg/m.   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 He is very malnourished and refuses to eat (only wants [place]). He is ok with TPN and needs improved nutrition if going to have surgery. Palliative care on board.   GI: Pre-albumin <5.  Endo: CBGs 70-80s. Insulin requirements in the past 24 hours: 0 Lytes: Na low 133, K 3.6, Mg 2. Phos on 5/18 2.6.  Renal: SCr 0.48, BUN 9.  Pulm: no issues Cards: VSS Hepatobil: LFTs wnl, Tbili 1.7 >>1.3.  Neuro: pain 4. High dose Thiamine 500mg  TID ID: WBC 21.6 (decreasing) on Zosyn. Afebrile. PCT 0.11.  TPN Access: PICC planned for day TPN start date: 07/21/18 Nutritional Goals (per RD recommendation on 5/18): KCal: 1850-2050 Protein: 105-120g Fluid: >1.8L  Goal TPN rate is 75 ml/hr (provides 110g protein, 54g lipids, and 261g dextrose, 1867 kcal meeting 100% of needs)  Current Nutrition:  Regular diet- refuses to eat Ensure Enlive - 1 yesterday  Plan:  Initiate TPN at 30 mL/hr. Start low and go slow due to risk of refeeding.  This TPN provides 44 g of protein, 104 g of dextrose, and 22 g of lipids which provides 747 kCals per day, meeting 40% of patient needs Electrolytes in TPN: standard for now Add MVI to TPN (will discontinue oral MVI) Trace Elements on MWF only due to shortage Add sensitive SSI q6h and adjust as needed Continue Thiamine 500mg  po TID as oral medication. NaPhos 10 mmol IV x1 per TPN electrolyte protocol.  Monitor TPN labs- CMET, Mg, and Phos in AM F/U if patient would be amendable to tube placement for enteral feeding F/U plan of care  Sloan Leiter, PharmD, BCPS, BCCCP Clinical  Pharmacist Please refer to [facility] for [place] numbers 07/21/2018,11:42 AM

## 2018-07-22 DIAGNOSIS — R29898 Other symptoms and signs involving the musculoskeletal system: Secondary | ICD-10-CM

## 2018-07-22 DIAGNOSIS — D649 Anemia, unspecified: Secondary | ICD-10-CM

## 2018-07-22 DIAGNOSIS — D500 Iron deficiency anemia secondary to blood loss (chronic): Principal | ICD-10-CM

## 2018-07-22 DIAGNOSIS — I6310 Cerebral infarction due to embolism of unspecified precerebral artery: Secondary | ICD-10-CM

## 2018-07-22 DIAGNOSIS — M7989 Other specified soft tissue disorders: Secondary | ICD-10-CM

## 2018-07-22 LAB — COMPREHENSIVE METABOLIC PANEL
ALT: 11 U/L (ref 0–44)
AST: 10 U/L — ABNORMAL LOW (ref 15–41)
Albumin: 1.2 g/dL — ABNORMAL LOW (ref 3.5–5.0)
Alkaline Phosphatase: 70 U/L (ref 38–126)
Anion gap: 5 (ref 5–15)
BUN: 10 mg/dL (ref 6–20)
CO2: 23 mmol/L (ref 22–32)
Calcium: 7.1 mg/dL — ABNORMAL LOW (ref 8.9–10.3)
Chloride: 103 mmol/L (ref 98–111)
Creatinine, Ser: 0.4 mg/dL — ABNORMAL LOW (ref 0.61–1.24)
GFR calc Af Amer: >60 mL/min (ref >60)
GFR calc non Af Amer: >60 mL/min (ref >60)
Glucose, Bld: 95 mg/dL (ref 70–99)
Potassium: 3.7 mmol/L (ref 3.5–5.1)
Sodium: 131 mmol/L — ABNORMAL LOW (ref 135–145)
Total Bilirubin: 0.8 mg/dL (ref 0.3–1.2)
Total Protein: 4.5 g/dL — ABNORMAL LOW (ref 6.5–8.1)

## 2018-07-22 LAB — DIFFERENTIAL
Abs Immature Granulocytes: 0.11 10*3/uL — ABNORMAL HIGH (ref 0.00–0.07)
Basophils Absolute: 0 10*3/uL (ref 0.0–0.1)
Basophils Relative: 0 %
Eosinophils Absolute: 0.1 10*3/uL (ref 0.0–0.5)
Eosinophils Relative: 1 %
Immature Granulocytes: 1 %
Lymphocytes Relative: 8 %
Lymphs Abs: 1.5 10*3/uL (ref 0.7–4.0)
Monocytes Absolute: 1.3 10*3/uL — ABNORMAL HIGH (ref 0.1–1.0)
Monocytes Relative: 7 %
Neutro Abs: 14.9 10*3/uL — ABNORMAL HIGH (ref 1.7–7.7)
Neutrophils Relative %: 83 %

## 2018-07-22 LAB — PREPARE RBC (CROSSMATCH)

## 2018-07-22 LAB — GLUCOSE, CAPILLARY
Glucose-Capillary: 109 mg/dL — ABNORMAL HIGH (ref 70–99)
Glucose-Capillary: 114 mg/dL — ABNORMAL HIGH (ref 70–99)
Glucose-Capillary: 92 mg/dL (ref 70–99)
Glucose-Capillary: 99 mg/dL (ref 70–99)

## 2018-07-22 LAB — CBC
HCT: 21 % — ABNORMAL LOW (ref 39.0–52.0)
Hemoglobin: 6.6 g/dL — CL (ref 13.0–17.0)
MCH: 26.5 pg (ref 26.0–34.0)
MCHC: 31.4 g/dL (ref 30.0–36.0)
MCV: 84.3 fL (ref 80.0–100.0)
Platelets: 287 10*3/uL (ref 150–400)
RBC: 2.49 MIL/uL — ABNORMAL LOW (ref 4.22–5.81)
RDW: 19.4 % — ABNORMAL HIGH (ref 11.5–15.5)
WBC: 18 10*3/uL — ABNORMAL HIGH (ref 4.0–10.5)
nRBC: 0 % (ref 0.0–0.2)

## 2018-07-22 LAB — PHOSPHORUS: Phosphorus: 2.8 mg/dL (ref 2.5–4.6)

## 2018-07-22 LAB — HEMOGLOBIN AND HEMATOCRIT, BLOOD
HCT: 27.2 % — ABNORMAL LOW (ref 39.0–52.0)
Hemoglobin: 8.9 g/dL — ABNORMAL LOW (ref 13.0–17.0)

## 2018-07-22 LAB — TRIGLYCERIDES: Triglycerides: 73 mg/dL (ref <150)

## 2018-07-22 LAB — PROCALCITONIN: Procalcitonin: 0.1 ng/mL

## 2018-07-22 LAB — PREALBUMIN: Prealbumin: <5 mg/dL — ABNORMAL LOW (ref 18–38)

## 2018-07-22 LAB — MAGNESIUM: Magnesium: 1.8 mg/dL (ref 1.7–2.4)

## 2018-07-22 MED ORDER — MAGNESIUM SULFATE IN D5W 1-5 GM/100ML-% IV SOLN
1.0000 g | Freq: Once | INTRAVENOUS | Status: AC
  Administered 2018-07-22: 1 g via INTRAVENOUS
  Filled 2018-07-22: qty 100

## 2018-07-22 MED ORDER — SODIUM CHLORIDE 0.9% IV SOLUTION: Freq: Once | INTRAVENOUS | Status: DC

## 2018-07-22 MED ORDER — PRO-STAT SUGAR FREE PO LIQD
30.0000 mL | Freq: Two times a day (BID) | ORAL | Status: DC
  Administered 2018-07-22 – 2018-07-27 (×7): 30 mL via ORAL
  Filled 2018-07-22 (×13): qty 30

## 2018-07-22 MED ORDER — TRAVASOL 10 % IV SOLN
INTRAVENOUS | Status: AC
  Administered 2018-07-22: 18:00:00 via INTRAVENOUS
  Filled 2018-07-22: qty 658.8

## 2018-07-22 NOTE — Progress Notes (Signed)
Nutrition Follow-up  DOCUMENTATION CODES:   Underweight, Severe malnutrition in context of chronic illness  INTERVENTION:   -D/c Ensure Enlive po TID, each supplement provides 350 kcal and 20 grams of protein -30 ml Prostat BID, each supplement provides 100 kcals and 15 grams protein -Magic Cup TID with meals, each supplement provides 290 kcals and 9 grams protein -TPN management per pharmacy - Follow up for ability to transition to enteral feedings  NUTRITION DIAGNOSIS:   Severe Malnutrition related to chronic illness(colonic mass) as evidenced by energy intake < 75% for > or equal to 1 month, moderate fat depletion, severe fat depletion, moderate muscle depletion, severe muscle depletion.  Ongoing  GOAL:   Patient will meet greater than or equal to 90% of their needs  Progressing  MONITOR:   PO intake, Supplement acceptance, Labs, Weight trends, Skin, I & O's  REASON FOR ASSESSMENT:   Consult New TPN/TNA  ASSESSMENT:   Brandon Landry is a 53 y.o. male with medical history significant of depression, iron deficiency anemia, tobacco abuse, who presents with generalized weakness.  5/18- s/p BSE- advanced to regular diet with thin liquids 5/19- PICC placed, TPN initiated  Reviewed I/O's: +1.5 L x 24 hours and +1.4 L since admission  UOP: 700 ml x 24 hours  Spoke with pt at bedside. Pt with lunch meal tray of spaghetti, strawberries, hot tea, and garden salad in front of him, however, untouched. Spoke with nurse tech, who reports that therapy tried to get him to eat breakfast this morning, but he refused. Nurse tech also reports that pt reports his stomach hurts due to not having a BM in 5 days- RN has been notified. Pt denied any difficulties with constipation PTA.   Pt very difficult to engage at time of visit. When attempting to obtain nutrition history, pt became very tangential and would change the subject. Pt became fixated on going home- when this RD asked why he needed  to go home, pt spoke about having bills to pay and not having social security documents to provide the Education officer, museum. He also reports "I haven't paid [employer] in 3 months, my credit card in one; I'm fleeing from the bank". RD attempted to redirect pt as to why he was here in the hospital and the importance of good nutrition in order to get stronger. When RD asked about TPN infusion, pt became tearful and looked out the window ("All I see is rain"). RD provided pt with emotional support and encouragement. RD sensed that pt appeared to be very overwhelmed with current health status. Did not broach subject of a feeding tube at this time, as to not further overwhelm or upset the pt. While nutritional support via enteral route would be most ideal for pt, unsure if pt would agree to a temporary feeding tube (such as cortrak) at this time. Additionally, if pt is to go home or to SNF in the immediate future, a short term feeding tube may complicate discharge disposition, unless permanent enteral access is established (also unsure of likelihood of placing permanent access such as a PEG due to pt's medically fragile state). Due to severity pf pt's malnutrition, recommend continue TPN if pt not amenable to short term feeding tube.   Pt remains on TPN- currently infusing at 30 ml/hr, which provides 747 kcals and 44 grams protein, meeting 40% of estimated kcal needs and 42% of estimated protein needs). Per pharmacy note, plan to advance slowly due to high feeding risk. At 1800,  plan to increase TPN to 45 ml/hr, which provides 1120 kcals and 66 grams protein (meeting 61% of estimated kcal needs and 63% of estimated protein needs).   Labs reviewed: Na: 131, K/Mg/Phos WDL. CBGS: 92-114 (inpatient orders for glycemic control are 0-9 units insulin aspart every 6 hours).  NUTRITION - FOCUSED PHYSICAL EXAM:    Most Recent Value  Orbital Region  Moderate depletion  Upper Arm Region  Severe depletion  Thoracic and Lumbar  Region  Severe depletion  Buccal Region  Severe depletion  Temple Region  Severe depletion  Clavicle Bone Region  Severe depletion  Clavicle and Acromion Bone Region  Severe depletion  Scapular Bone Region  Severe depletion  Dorsal Hand  Severe depletion  Patellar Region  Severe depletion  Anterior Thigh Region  Severe depletion  Posterior Calf Region  Severe depletion  Edema (RD Assessment)  None  Hair  Reviewed  Eyes  Reviewed  Mouth  Reviewed  Skin  Reviewed  Nails  Reviewed       Diet Order:   Diet Order            Diet regular Room service appropriate? Yes with Assist; Fluid consistency: Thin  Diet effective now              EDUCATION NEEDS:   Education needs have been addressed  Skin:  Skin Assessment: Reviewed RN Assessment  Last BM:  07/22/18  Height:   Ht Readings from Last 1 Encounters:  07/06/18 5' 8.11" (1.73 m)    Weight:   Wt Readings from Last 1 Encounters:  07/22/18 51.2 kg    Ideal Body Weight:  70 kg  BMI:  Body mass index is 17.11 kg/m.  Estimated Nutritional Needs:   Kcal:  1850-2050  Protein:  105-120 grams  Fluid:  > 1.8 L    Aleli Navedo A. Jimmye Norman, RD, LDN, [place] Registered Dietitian II Certified Diabetes Care and Education Specialist Pager: [phone] After hours Pager: [phone]

## 2018-07-22 NOTE — Progress Notes (Signed)
Patient has been started on TPN.  He is sitting in the recliner chair and looks fairly well.  He tells me his body is changing - he used to have diarrhea 3-4 times a day, but today he has only had 1 bowel movement.    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  He is concerned that his credit card and utility bills at home are accumulating and he would like to get them paid.    I received a request from the patient's nephew in [place] to be available for a call from the Doctors with "[place]".  Our team will be available to them.  Important contact numbers for Chris's care:  1.  [name] Peak, Adult Protective Services Case worker (has great rapport with patient) [phone]  2.  Patient's older sister [facility] and patient's nephew Ivor Kishi "Sam"  [phone].   Sam speaks english well.  Cheri Fowler is working with [place] to try to get Gerald Stabs back to [place].  3.  Eun-Hee (younger sister) in [place].  Patient's goals:  Continue aggressive care with TPN and antibiotics in preparation for surgery.  Ideally he would like to return to [place] for care as his family can pay for it.   With regard to code status:  Patient states that if he has arrested he does not want to be resuscitated.  However he is pursing aggressive treatment and if he has not arrested he would like full scope treatment including intubation.   At this point we do not have an Economist designated for [employer].    Florentina Jenny, PA-C Palliative Medicine Pager: [phone]

## 2018-07-22 NOTE — Plan of Care (Signed)

## 2018-07-22 NOTE — Progress Notes (Signed)
Hemoglobin is 6.6.  MD text paged.  Will continue to monitor.

## 2018-07-22 NOTE — Progress Notes (Signed)
Pharmacy Antibiotic Note  Brandon Landry is a 53 y.o. male admitted on 07/19/2018 with intra-abdominal infection .  Pharmacy has been consulted for zosyn dosing. SCr 0.4, CrCl ~ 73ml/min  Plan: Zosyn 3.375g IV every 8 hours Monitor renal function, Cx and LOT  Weight: 112 lb 14 oz (51.2 kg)  Temp (24hrs), Avg:98.1 F (36.7 C), Min:97.6 F (36.4 C), Max:99 F (37.2 C)  Recent Labs  Lab 07/19/18 0534 07/19/18 0953 07/20/18 0337 07/21/18 0300 07/22/18 0353  WBC 26.4* 26.2* 25.0* 21.6* 18.0*  CREATININE 0.50*  --  0.54* 0.48* 0.40*    Estimated Creatinine Clearance: 77.3 mL/min (A) (by C-G formula based on SCr of 0.4 mg/dL (L)).    No Known Allergies  Antimicrobials this admission: Zosyn 5/17>>  Dose adjustments this admission: n/a  Microbiology results: 5/17 BCx: NGTD  Brandon Landry A. Levada Dy, PharmD, [place] Please utilize Amion for appropriate phone number to reach the unit pharmacist ([place])   07/22/2018 7:38 AM

## 2018-07-22 NOTE — Progress Notes (Signed)
[place] NOTE   Pharmacy Consult for TPN Indication: Severe malnutrition; R-colon mass extending into duodenum   Patient Measurements: Weight: 112 lb 14 oz (51.2 kg)   Body mass index is 17.11 kg/m.   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 He is very malnourished and refuses to eat (only wants [place]). He is ok with TPN and needs improved nutrition if going to have surgery. Palliative care on board.   GI: Pre-albumin <5.  Endo: CBGs 92-114 with start of TPN. Insulin requirements in the past 24 hours: 0 units Lytes: Na low 131, K 3.7, Phos 2.8 (after additional 10 mmol supplemented IV yesterday), Mg 1.8. CoCa 9.3 (alb 1.2) Renal: SCr 0.40, BUN 10.  Pulm: no issues Cards: VSS Hepatobil: LFT/Tbili/TG within normal limits.  Neuro: pain 0-2. High dose Thiamine 500mg  TID ID: WBC 18 (decreasing) on Zosyn. Afebrile. PCT 0.10.  TPN Access: PICC planned for day TPN start date: 07/21/18 Nutritional Goals (per RD recommendation on 5/19): KCal: 1850-2050 Protein: 105-120g Fluid: >1.8L  Goal TPN rate is 75 ml/hr (provides 110g protein, 54g lipids, and 261g dextrose, 1867 kcal meeting 100% of needs)  Current Nutrition:  Regular diet- refuses to eat Ensure Enlive - 1 yesterday  Plan:  Increase TPN to 45 mL/hr. Advancing slowly due to risk of refeeding.  This TPN provides 66 g of protein, 157 g of dextrose, and 32 g of lipids which provides 1120 kCals per day, meeting ~60% of patient needs Electrolytes in TPN: increase Na and slightly increase Phos and Mg, others standard for now. MB:[id number] 1:1.  Add MVI to TPN (will discontinue oral MVI) Trace Elements on MWF only due to shortage Continue sensitive SSI q6h and adjust as needed Continue Thiamine 500mg  po TID as oral medication. Give Magnesium 1g IV x1 today.  Monitor TPN labs Follow-up if patient would  be amendable to tube placement for enteral feeding as this would be best with functioning GI tract Follow-up plan of care  Sloan Leiter, PharmD, BCPS, BCCCP Clinical Pharmacist Please refer to [facility] for [place] numbers 07/22/2018,7:48 AM

## 2018-07-22 NOTE — Progress Notes (Signed)
[facility]                                                                              Brandon Landry Demographics  Brandon Landry, is a 53 y.o. male, DOB - [birth year], SNK:[id number]  Admit date - 07/19/2018   Admitting Physician Ivor Costa, MD  Outpatient Primary MD for the Brandon Landry is Brandon Landry, No Pcp Per  Outpatient specialists:   LOS - 3  days   Medical records reviewed and are as summarized below:    No chief complaint on file.      Brief summary  Per HPI: Brandon Landry is a34 y.o.malewho was hospitalized from 5/4 to 5/6 after he was found to have severe microcytic anemia with a hemoglobin of 3.1, he was transfused PRBC, CT scan of the abdomen on 5/4 showed a large fungating mass in the ascending colon with contained perforation and adjacent abscess formation, general surgery was subsequently consulted-surgical intervention was contemplated-however Brandon Landry refused and was subsequently discharged home on 5/6. He returned [facility] on 5/16 with generalized weakness, and was found to have a hemoglobin of 3.2. He was transfused 2 units of PRBC, and transferred to [facility]. He now wants to pursue surgical options. General surgery was consulted, cannot undergo surgery due to severe malnutrition, Brandon Landry refuses to eat American food.  Palliative care was consulted, currently working with Brandon Landry and family regarding goals of care.  PICC line has been placed and Brandon Landry has been started on TPN for nourishment. Brandon Landry now wishes to go to [place] for all of his medical care, being investigated by palliative medicine. 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  Initially, there appeared to be a thrombus in the ascending aorta versus dissection.  He was offered CT chest with angiogram on 5/18 and refused, but was agreeable on 5/19.  Assessment & Plan    Principal Problem: Severe microcytic  symptomatic anemia -Presented with hemoglobin of 3.2, secondary to chronic blood loss, acute illness, colon mass -Brandon Landry has received 2 units packed RBC transfusion at [facility], continue supportive care -Hemoglobin currently 6.6, transfuse 1 more unit of packed RBC, follow H&H   Active problems Large fungating mass in the ascending colon with probable perforation and adjacent abscess -Currently no abdominal pain or fevers -Repeat CT showed large fungating mass in the right hemiabdomen centered around the ascending colon, invasion of the second and third portions of the duodenum, ascites -General surgery has signed off, Brandon Landry has not been cooperative and would like to pursue further management in [place], TPN via PICC line -Palliative following.  Left arm weakness, bilateral strokes -MRI brain showed numerous punctate foci of acute/early subacute infarctions in the right greater than left posterior cerebral hemispheres, right cerebellar hemispheres, no enhancing metastasis. -CT chest showed small bilateral pleural effusions with mural filling defect along the left right lateral wall of ascending thoracic aorta concerning for thrombus -CTA aorta showed several areas of mural thickening and intimal irregularity noted in the ascending aorta favoring infectious vegetation/thrombus -2D echo showed EF of 40 to 45% with low  flow state LV recommend rule out thrombus -Neurology following, not on antithrombotic agents given severe anemia.  Not a candidate for anticoagulation unless anemia and bleeding source is secured.  However Brandon Landry has refused surgery for the last fungating colon mass.  Ascending aorta vegetation/thrombus -Neurology following, CTA aorta showed several areas of mural thickening and intimal irregularity in the ascending aorta -Likely related to advanced metastatic colon cancer, not a candidate for anticoagulation due to severe anemia and refusal for the  surgery or further management -Recommended palliative and hospice at this time  Left arm swelling -Doppler studies negative for DVT  Severe malnutrition -PICC line has been placed, continue TPN  Goals of care -Palliative medicine following, overall poor prognosis -Unfortunately no local support due to recent divorce and immediate family in [place] in [place].  Brandon Landry will either need hospice or SNF/LTAC while awaiting disposition to [place] which might be significantly delayed due to ongoing pandemic.   Code Status: Full code DVT Prophylaxis:  SCD's Family Communication: Discussed in detail with the Brandon Landry, all imaging results, lab results explained to the Brandon Landry.  Palliative medicine has been in contact with his family in [place] and [place].    Disposition Plan: Likely will need skilled nursing facility/LTAC versus hospice  Time Spent in minutes 40 minutes  Procedures:  None  Consultants:   General surgery Palliative medicine Neurology  Antimicrobials:   Anti-infectives (From admission, onward)   Start     Dose/Rate Route Frequency Ordered Stop   07/19/18 1400  piperacillin-tazobactam (ZOSYN) IVPB 3.375 g     3.375 g 12.5 mL/hr over 240 Minutes Intravenous Every 8 hours 07/19/18 0651     07/19/18 0600  piperacillin-tazobactam (ZOSYN) IVPB 3.375 g  Status:  Discontinued     3.375 g 12.5 mL/hr over 240 Minutes Intravenous Every 8 hours 07/19/18 0442 07/19/18 0447   07/19/18 0500  piperacillin-tazobactam (ZOSYN) IVPB 3.375 g     3.375 g 100 mL/hr over 30 Minutes Intravenous  Once 07/19/18 0447 07/19/18 1445          Medications  Scheduled Meds:  sodium chloride   Intravenous Once   sodium chloride   Intravenous Once   sodium chloride   Intravenous Once   feeding supplement (ENSURE ENLIVE)  237 mL Oral TID BM   insulin aspart  0-9 Units Subcutaneous Q6H   nicotine  21 mg Transdermal Daily   [START ON 07/24/2018] thiamine  100 mg Oral Daily   thiamine   500 mg Oral TID   Continuous Infusions:  sodium chloride 50 mL/hr at 07/22/18 0450   sodium chloride 50 mL/hr at 07/21/18 1800   magnesium sulfate bolus IVPB     piperacillin-tazobactam (ZOSYN)  IV 3.375 g (07/22/18 0603)   TPN ADULT (ION) 30 mL/hr at 07/22/18 0450   TPN ADULT (ION)     PRN Meds:.sodium chloride, acetaminophen **OR** acetaminophen, ondansetron **OR** ondansetron (ZOFRAN) IV, sodium chloride flush      Subjective:   [place] was seen and examined today.  Very upset about the food, states " nobody gives me food here" does not like American food.  States has abdominal pain.  No nausea or vomiting or diarrhea.  No acute events overnight.  Objective:   Vitals:   07/22/18 0521 07/22/18 0541 07/22/18 0930 07/22/18 1009  BP: 128/64 104/73 92/66 99/74   Pulse: 73 78 92 84  Resp: 16 14 16 16   Temp: 97.8 F (36.6 C) 99 F (37.2 C) 97.8 F (36.6 C) 97.6 F (  36.4 C)  TempSrc: Oral   Axillary  SpO2: 93% 100% 100% 100%  Weight:        Intake/Output Summary (Last 24 hours) at 07/22/2018 1106 Last data filed at 07/22/2018 0950 Gross per 24 hour  Intake 2080.91 ml  Output 700 ml  Net 1380.91 ml     Wt Readings from Last 3 Encounters:  07/22/18 51.2 kg  07/06/18 50.1 kg     Exam  General: Alert and oriented x 3, NAD, limited English, frustrated  Eyes: ,  HEENT:  Atraumatic, normocephalic  Cardiovascular: S1 S2 auscultated,  Regular rate and rhythm.  Respiratory: Clear to auscultation bilaterally, no wheezing, rales or rhonchi  Gastrointestinal: Soft, nontender, nondistended, + bowel sounds  Ext: no pedal edema bilaterally  Neuro: Moving all 4 extremities  Musculoskeletal: No digital cyanosis, clubbing  Skin: No rashes  Psych: Normal affect and demeanor, alert and oriented x3    Data Reviewed:  I have personally reviewed following labs and imaging studies  Micro Results Recent Results (from the past 240 hour(s))  Culture, blood  (Routine X 2) w Reflex to ID Panel     Status: None (Preliminary result)   Collection Time: 07/19/18  5:34 AM  Result Value Ref Range Status   Specimen Description BLOOD RIGHT ARM  Final   Special Requests   Final    BOTTLES DRAWN AEROBIC ONLY Blood Culture adequate volume   Culture   Final    NO GROWTH 2 DAYS Performed at [facility], [address] [place], [place] [address]    Report Status PENDING  Incomplete  Culture, blood (Routine X 2) w Reflex to ID Panel     Status: None (Preliminary result)   Collection Time: 07/19/18  5:34 AM  Result Value Ref Range Status   Specimen Description BLOOD LEFT ANTECUBITAL  Final   Special Requests   Final    BOTTLES DRAWN AEROBIC ONLY Blood Culture results may not be optimal due to an inadequate volume of blood received in culture bottles   Culture   Final    NO GROWTH 2 DAYS Performed at [facility], [place] [address] [place], [place] [address]    Report Status PENDING  Incomplete    Radiology Reports Ct Chest W Contrast  Result Date: 07/20/2018 CLINICAL DATA:  Colon cancer. EXAM: CT CHEST, ABDOMEN, AND PELVIS WITH CONTRAST TECHNIQUE: Multidetector CT imaging of the chest, abdomen and pelvis was performed following the standard protocol during bolus administration of intravenous contrast. CONTRAST:  167mL OMNIPAQUE IOHEXOL 300 MG/ML  SOLN COMPARISON:  07/06/2018 FINDINGS: CT CHEST FINDINGS Cardiovascular: The heart size is normal. Calcification in the LAD coronary artery. There is a 2.5 cm low-attenuation filling defect arising from the right lateral wall of the ascending thoracic aorta concerning for thrombus. Mediastinum/Nodes: Normal appearance of the thyroid gland. The trachea appears patent and is midline. Normal appearance of the esophagus. No mediastinal or hilar adenopathy. No axillary or supraclavicular adenopathy. Lungs/Pleura: Small bilateral pleural effusions, similar. Moderate changes of centrilobular and paraseptal  emphysema. Scar like density identified in the right apex. No suspicious pulmonary nodule or mass identified. Musculoskeletal: No aggressive lytic or sclerotic bone lesions. CT ABDOMEN PELVIS FINDINGS Hepatobiliary: Low-density structure in lateral segment of left lobe of liver is unchanged measuring 9 mm, image 63/3. No new liver abnormality identified. Pancreas: Unremarkable. No pancreatic ductal dilatation or surrounding inflammatory changes. Spleen: Normal in size. Small peripheral wedge-shaped areas of low attenuation concerning for splenic infarcts.  Adrenals/Urinary Tract: Normal appearance of the adrenal glands. There are several small right kidney lesions, too small to reliably characterize. No hydronephrosis bilaterally. Urinary bladder is negative. Stomach/Bowel: The stomach is nondistended. The proximal small bowel loops have a normal caliber. Abnormal bowel wall edema involving the terminal ileum is identified, similar to previous exam. Large necrotic mass within the right abdomen centered around the ascending colon is again noted measuring 10.2 by 8.6 by 10.1 cm (volume = 460 cm^3). Previously this measured 9.9 x 9.9 by 9.6 cm (volume = 490 cm^3). As noted previously tumor appears to involve the second and third portions of the duodenum. Distal colon unremarkable. Vascular/Lymphatic: Aortic atherosclerosis without aneurysm. No abdominopelvic adenopathy. Reproductive: Prostate is unremarkable. Other: Diffuse abdominopelvic ascites is identified. Musculoskeletal: No acute or significant osseous findings. IMPRESSION: 1. Similar size of large fungating mass within the right hemiabdomen centered around the ascending colon. Invasion of the second and third portions of the duodenum noted. 2. Ascites. 3. Small bilateral pleural effusions. Mural filling defect along the right lateral wall of the ascending thoracic aorta is concerning for thrombus, image 44/6. 4. Small splenic infarcts. Electronically Signed    By: Kerby Moors M.D.   On: 07/20/2018 09:12   Mr Jeri Cos ZD Contrast  Result Date: 07/20/2018 CLINICAL DATA:  53 y/o M; left arm weakness. Possible colon cancer. EXAM: MRI HEAD WITHOUT AND WITH CONTRAST TECHNIQUE: Multiplanar, multiecho pulse sequences of the brain and surrounding structures were obtained without and with intravenous contrast. CONTRAST:  5 cc Gadavist. COMPARISON:  None. FINDINGS: Brain: Numerous punctate foci of reduced diffusion are present throughout the right greater than left posterior frontal lobes, bilateral parietal lobes, right posterior temporal lobe, bilateral occipital lobes, and the right cerebellar hemisphere. Foci of reduced diffusion demonstrate T2 FLAIR hyperintensity. No hemorrhage. After administration of intravenous contrast there is no abnormal enhancement. No extra-axial collection, hydrocephalus, mass effect, or herniation. Vascular: Normal flow voids. Skull and upper cervical spine: Normal marrow signal. Sinuses/Orbits: Negative. Other: None. IMPRESSION: 1. Numerous punctate foci of acute/early subacute infarction are present throughout the right greater than left posterior cerebral hemispheres and the right cerebellar hemisphere. Multiple vascular territories suggests embolic etiology. No hemorrhage or mass effect. 2. No enhancing intracranial metastasis identified. These results will be called to the ordering clinician or representative by the Radiologist Assistant, and communication documented in the PACS or zVision Dashboard. Electronically Signed   By: Kristine Garbe M.D.   On: 07/20/2018 01:18   Ct Abdomen Pelvis W Contrast  Result Date: 07/20/2018 CLINICAL DATA:  Colon cancer. EXAM: CT CHEST, ABDOMEN, AND PELVIS WITH CONTRAST TECHNIQUE: Multidetector CT imaging of the chest, abdomen and pelvis was performed following the standard protocol during bolus administration of intravenous contrast. CONTRAST:  135mL OMNIPAQUE IOHEXOL 300 MG/ML  SOLN  COMPARISON:  07/06/2018 FINDINGS: CT CHEST FINDINGS Cardiovascular: The heart size is normal. Calcification in the LAD coronary artery. There is a 2.5 cm low-attenuation filling defect arising from the right lateral wall of the ascending thoracic aorta concerning for thrombus. Mediastinum/Nodes: Normal appearance of the thyroid gland. The trachea appears patent and is midline. Normal appearance of the esophagus. No mediastinal or hilar adenopathy. No axillary or supraclavicular adenopathy. Lungs/Pleura: Small bilateral pleural effusions, similar. Moderate changes of centrilobular and paraseptal emphysema. Scar like density identified in the right apex. No suspicious pulmonary nodule or mass identified. Musculoskeletal: No aggressive lytic or sclerotic bone lesions. CT ABDOMEN PELVIS FINDINGS Hepatobiliary: Low-density structure in lateral segment of left lobe of  liver is unchanged measuring 9 mm, image 63/3. No new liver abnormality identified. Pancreas: Unremarkable. No pancreatic ductal dilatation or surrounding inflammatory changes. Spleen: Normal in size. Small peripheral wedge-shaped areas of low attenuation concerning for splenic infarcts. Adrenals/Urinary Tract: Normal appearance of the adrenal glands. There are several small right kidney lesions, too small to reliably characterize. No hydronephrosis bilaterally. Urinary bladder is negative. Stomach/Bowel: The stomach is nondistended. The proximal small bowel loops have a normal caliber. Abnormal bowel wall edema involving the terminal ileum is identified, similar to previous exam. Large necrotic mass within the right abdomen centered around the ascending colon is again noted measuring 10.2 by 8.6 by 10.1 cm (volume = 460 cm^3). Previously this measured 9.9 x 9.9 by 9.6 cm (volume = 490 cm^3). As noted previously tumor appears to involve the second and third portions of the duodenum. Distal colon unremarkable. Vascular/Lymphatic: Aortic atherosclerosis  without aneurysm. No abdominopelvic adenopathy. Reproductive: Prostate is unremarkable. Other: Diffuse abdominopelvic ascites is identified. Musculoskeletal: No acute or significant osseous findings. IMPRESSION: 1. Similar size of large fungating mass within the right hemiabdomen centered around the ascending colon. Invasion of the second and third portions of the duodenum noted. 2. Ascites. 3. Small bilateral pleural effusions. Mural filling defect along the right lateral wall of the ascending thoracic aorta is concerning for thrombus, image 44/6. 4. Small splenic infarcts. Electronically Signed   By: Kerby Moors M.D.   On: 07/20/2018 09:12   Ct Abdomen Pelvis W Contrast  Result Date: 07/06/2018 CLINICAL DATA:  Left lower quadrant pain EXAM: CT ABDOMEN AND PELVIS WITH CONTRAST TECHNIQUE: Multidetector CT imaging of the abdomen and pelvis was performed using the standard protocol following bolus administration of intravenous contrast. CONTRAST:  129mL OMNIPAQUE IOHEXOL 300 MG/ML  SOLN COMPARISON:  None. FINDINGS: Lower chest: Small bilateral pleural effusions are noted right greater than left with mild bibasilar atelectatic changes. Hepatobiliary: Liver is well visualized and within normal limits. The gallbladder is well distended without cholelithiasis. Pancreas: Unremarkable. No pancreatic ductal dilatation or surrounding inflammatory changes. Spleen: Normal in size without focal abnormality. Adrenals/Urinary Tract: Adrenal glands are within normal limits. The kidneys demonstrate a normal enhancement pattern and normal excretion bilaterally. A small cortical cyst is noted within the right kidney measuring approximately 1 cm. The bladder is well distended. Stomach/Bowel: The distal colon appears within normal limits. In the ascending colon there is a large colonic mass with changes highly suggestive of contained perforation. This measures approximately 9.9 x 9.9 cm in greatest AP and transverse dimensions.  This extends along the margin of the second portion of the duodenum and duodenal wall thickening is noted suggestive of localized invasion. Mottled air fluid collection is noted adjacent to the column contrast which likely represents contained perforation. Free fluid is noted within the pelvis consistent with the perforation. The appendix is within normal limits. The jejunum and ileum are within normal limits with the exception of some wall thickening in the distal ileum related to the localized inflammatory change from the colonic mass. Vascular/Lymphatic: Aortic atherosclerosis. No enlarged abdominal or pelvic lymph nodes. Reproductive: Prostate is unremarkable. Other: Free fluid is noted within the pelvis as previously described. This also extends higher in the abdomen along the spleen and liver consistent with the findings in the ascending colon consistent with contained perforation. Musculoskeletal: No acute bony abnormality is seen. IMPRESSION: Large fungating mass in the ascending colon with changes highly suggestive of contained perforation and adjacent abscess formation. This extends along the margin of the  second portion of the duodenum in the possibility of localized invasion of the mass deserves consideration. Surgical consultation is recommended. Free fluid within the abdomen consistent with the known perforated mass. No definitive extravasation of contrast material is noted at this time consistent with a more contained perforation. Bilateral pleural effusions with mild bibasilar atelectasis. Electronically Signed   By: Inez Catalina M.D.   On: 07/06/2018 17:55   Ct Angio Chest Aorta W/cm &/or Wo/cm  Result Date: 07/21/2018 CLINICAL DATA:  52 year old male with history of embolic strokes. Possible thrombus in the ascending thoracic aorta noted on prior non gated chest CT. Follow-up study. EXAM: CT ANGIOGRAPHY CHEST WITH CONTRAST TECHNIQUE: Multidetector CT imaging of the chest was performed using the  standard protocol as well as cardiac gating during bolus administration of intravenous contrast. Multiplanar CT image reconstructions and MIPs were obtained to evaluate the vascular anatomy. CONTRAST:  157mL OMNIPAQUE IOHEXOL 350 MG/ML SOLN COMPARISON:  Chest CT 05/18/20209. FINDINGS: Cardiovascular: Heart size is enlarged with mild left ventricular dilatation. There is also some mild myocardial thinning in the apex of the left ventricle which appears rounded, suggesting sequela of prior distal LAD territory myocardial infarction. There is no significant pericardial fluid, thickening or pericardial calcification. Aortic atherosclerosis, as well as calcified atherosclerotic plaque in the left anterior descending coronary artery. Several areas of mural thickening and intimal irregularity are noted in the ascending thoracic aorta. The first of these is at and immediately above the level of the sino-tubular junction best appreciated on axial image 100 of series 14, where this focal mural thickening of the posterolateral wall of the ascending thoracic aorta on the right side extends into the lumen over a distance of approximately 11 mm craniocaudally (coronal image 31 of series 18). The second intimal irregularity is best appreciated on axial image 60 of series 14 also in the proximal ascending thoracic aorta slightly cranial to the previously described lesion, without much extension into the lumen. The final lesion is much larger, best appreciated on axial image 24 of series 14 and coronal image 63 of series 10 where a focal area of mural thickening along the right lateral wall of the ascending thoracic aorta measures up to approximately 10 x 8 mm, and a pedunculated portion of the lesion extends into the lumen cephalad over distance of approximately 3.3 cm toward the proximal aortic arch. Mediastinum/Nodes: No pathologically enlarged mediastinal or hilar lymph nodes. Esophagus is unremarkable in appearance. No axillary  lymphadenopathy. Lungs/Pleura: Small bilateral pleural effusions lying dependently with some associated passive subsegmental atelectasis in the lower lobes of the lungs bilaterally. No acute consolidative airspace disease. Diffuse bronchial wall thickening with moderate centrilobular and paraseptal emphysema. No definite suspicious appearing pulmonary nodules or masses are noted. Upper Abdomen: Please refer to dedicated CT the abdomen and pelvis from yesterday for full description of findings beneath the diaphragm. Musculoskeletal: There are no aggressive appearing lytic or blastic lesions noted in the visualized portions of the skeleton. Review of the MIP images confirms the above findings. IMPRESSION: 1. Three focal areas of mural thickening and intimal irregularity in the ascending thoracic aorta, as detailed above. These are highly unusual. Based on the multiplicity of these lesions, and the Brandon Landry's history of large colonic mass which shows signs of pending perforation, these are strongly favored to represent infectious vegetations/thrombus. 2. Aortic atherosclerosis, in addition to left anterior descending coronary artery disease. Please note that although the presence of coronary artery calcium documents the presence of coronary artery disease, the  severity of this disease and any potential stenosis cannot be assessed on this non-gated CT examination. Assessment for potential risk factor modification, dietary therapy or pharmacologic therapy may be warranted, if clinically indicated. 3. Cardiomegaly with mild left ventricular dilatation. 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 4. Small bilateral pleural effusions lying dependently. 5. Diffuse bronchial wall thickening with moderate centrilobular and paraseptal emphysema; imaging findings suggestive of underlying COPD. Critical Value/emergent results  were called by telephone at the time of interpretation on 07/21/2018 at 2:04 pm to [place], who verbally acknowledged these results. Aortic Atherosclerosis (ICD10-I70.0) and Emphysema (ICD10-J43.9). Electronically Signed   By: Vinnie Langton M.D.   On: 07/21/2018 14:05   Vas [place] Upper Extremity Venous Duplex  Result Date: 07/20/2018 UPPER VENOUS STUDY  Indications: Swelling Comparison Study: No prior study on file for comparison Performing Technologist: Sharion Dove RVS  Examination Guidelines: A complete evaluation includes B-mode imaging, spectral Doppler, color Doppler, and power Doppler as needed of all accessible portions of each vessel. Bilateral testing is considered an integral part of a complete examination. Limited examinations for reoccurring indications may be performed as noted.  Right Findings: +----------+------------+---------+-----------+----------+-------+  RIGHT      Compressible Phasicity Spontaneous Properties Summary  +----------+------------+---------+-----------+----------+-------+  Subclavian                 Yes        Yes                         +----------+------------+---------+-----------+----------+-------+  Left Findings: +----------+------------+---------+-----------+----------+-------+  LEFT       Compressible Phasicity Spontaneous Properties Summary  +----------+------------+---------+-----------+----------+-------+  IJV                        Yes        Yes                         +----------+------------+---------+-----------+----------+-------+  Subclavian     Full        Yes        Yes                         +----------+------------+---------+-----------+----------+-------+  Axillary       Full        Yes        Yes                         +----------+------------+---------+-----------+----------+-------+  Brachial       Full        Yes        Yes                         +----------+------------+---------+-----------+----------+-------+  Radial         Full                                                +----------+------------+---------+-----------+----------+-------+  Ulnar          Full                                               +----------+------------+---------+-----------+----------+-------+  Cephalic     Partial                                     Chronic  +----------+------------+---------+-----------+----------+-------+  Basilic        Full                                               +----------+------------+---------+-----------+----------+-------+  Summary:  Right: No evidence of thrombosis in the subclavian.  Left: No evidence of deep vein thrombosis in the upper extremity. 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 Chronic partial thrombus noted in a small portion of the cephalic vein from [facility] to mid upper arm.  *See table(s) above for measurements and observations.  Diagnosing physician: Harold Barban MD Electronically signed by Harold Barban MD on 07/20/2018 at 9:11:19 AM.    Final    [place] Ekg Site Rite  Result Date: 07/21/2018 If Site Rite image not attached, placement could not be confirmed due to current cardiac rhythm.   Lab Data:  CBC: Recent Labs  Lab 07/19/18 0534 07/19/18 0953 07/20/18 0337 07/21/18 0300 07/22/18 0353  WBC 26.4* 26.2* 25.0* 21.6* 18.0*  NEUTROABS  --  22.7*  --   --  14.9*  HGB 8.1* 7.6* 7.6* 7.2* 6.6*  HCT 25.2* 23.7* 23.6* 22.0* 21.0*  MCV 83.2 82.6 81.7 81.8 84.3  PLT 372 341 334 292 850   Basic Metabolic Panel: Recent Labs  Lab 07/19/18 0534 07/20/18 0337 07/21/18 0300 07/22/18 0353  NA 135 132* 133* 131*  K 3.2* 3.9 3.6 3.7  CL 104 106 104 103  CO2 19* 20* 22 23  GLUCOSE 84 85 97 95  BUN 17 12 9 10   CREATININE 0.50* 0.54* 0.48* 0.40*  CALCIUM 7.4* 7.2* 7.3* 7.1*  MG 2.0 1.9 2.0 1.8  PHOS  --  2.6  --  2.8   GFR: Estimated Creatinine Clearance: 77.3 mL/min (A) (by C-G formula based on SCr of 0.4 mg/dL (L)). Liver Function Tests: Recent  Labs  Lab 07/19/18 0534 07/20/18 0337 07/22/18 0353  AST 13* 10* 10*  ALT 11 9 11   ALKPHOS 83 74 70  BILITOT 1.7* 1.3* 0.8  PROT 5.1* 5.0* 4.5*  ALBUMIN 1.5* 1.3* 1.2*   No results for input(s): LIPASE, AMYLASE in the last 168 hours. No results for input(s): AMMONIA in the last 168 hours. Coagulation Profile: Recent Labs  Lab 07/19/18 0534  INR 1.3*   Cardiac Enzymes: No results for input(s): CKTOTAL, CKMB, CKMBINDEX, TROPONINI in the last 168 hours. BNP (last 3 results) No results for input(s): PROBNP in the last 8760 hours. HbA1C: No results for input(s): HGBA1C in the last 72 hours. CBG: Recent Labs  Lab 07/19/18 0755 07/20/18 0800 07/21/18 0754 07/22/18 0040 07/22/18 0552  GLUCAP 85 84 71 114* 92   Lipid Profile: Recent Labs    07/22/18 0353  TRIG 73   Thyroid Function Tests: No results for input(s): TSH, T4TOTAL, FREET4, T3FREE, THYROIDAB in the last 72 hours. Anemia Panel: No results for input(s): VITAMINB12, FOLATE, FERRITIN, TIBC, IRON, RETICCTPCT in the last 72 hours. Urine analysis: No results found for: COLORURINE, APPEARANCEUR, LABSPEC, PHURINE, GLUCOSEU, HGBUR, BILIRUBINUR, KETONESUR, PROTEINUR, UROBILINOGEN, NITRITE, LEUKOCYTESUR   Terah Robey M.D. [facility] 07/22/2018, 11:06 AM  Pager: [phone] Between 7am to 7pm - call Pager - [phone]  After 7pm go to [email or web address] - password [email or web address]  Call night coverage person covering after 7pm

## 2018-07-22 NOTE — Progress Notes (Signed)
Physical Therapy Treatment Patient Details Name: Brandon Landry MRN: [id number] DOB: [birth year] Today's Date: 07/22/2018    History of Present Illness Pt is a 53 y/o male admitted secondary to symptomatic anemia. CT of abdomen revealed right colon mass with possible extension to involve the duodenum. Pt also with LUE weakness and MRI revealed numerous punctate foci of acute/early subacute infarction present throughout the right greater than left posterior cerebral hemispheres and the right cerebellar hemisphere. PMH includes depression, iron deficiency anemia, and tobacco abuse.     PT Comments    Patient seen for mobility progression. Pt requires min guard/min A for mobility overall this session. Pt demonstrates generalized weakness and decreased activity tolerance. Continue to progress as tolerated with anticipated d/c to SNF for further skilled PT services.      Follow Up Recommendations  SNF;Supervision/Assistance - 24 hour     Equipment Recommendations  Rolling walker with 5" wheels    Recommendations for Other Services       Precautions / Restrictions Precautions Precautions: Fall Restrictions Weight Bearing Restrictions: No    Mobility  Bed Mobility Overal bed mobility: Needs Assistance Bed Mobility: Supine to Sit     Supine to sit: HOB elevated;Min assist     General bed mobility comments: assist to elevate trunk into sitting  Transfers Overall transfer level: Needs assistance Equipment used: Rolling walker (2 wheeled) Transfers: Sit to/from Stand Sit to Stand: Min assist         General transfer comment: cues for safe hand placement; assist to power up into standing   Ambulation/Gait Ambulation/Gait assistance: Min guard Gait Distance (Feet): 100 Feet Assistive device: Rolling walker (2 wheeled) Gait Pattern/deviations: Step-through pattern;Decreased stride length;Trunk flexed Gait velocity: decr   General Gait Details: min guard for safety; guarded and  decreased cadence; cues for keeping RW closer    Stairs             Wheelchair Mobility    Modified Rankin (Stroke Patients Only) Modified Rankin (Stroke Patients Only) Pre-Morbid Rankin Score: Slight disability Modified Rankin: Moderately severe disability     Balance Overall balance assessment: Needs assistance;History of Falls Sitting-balance support: No upper extremity supported;Feet supported Sitting balance-Leahy Scale: Good     Standing balance support: Bilateral upper extremity supported Standing balance-Leahy Scale: Poor Standing balance comment: reliant on RW                            Cognition Arousal/Alertness: Awake/alert Behavior During Therapy: WFL for tasks assessed/performed Overall Cognitive Status: Within Functional Limits for tasks assessed Area of Impairment: Safety/judgement;Problem solving                         Safety/Judgement: Decreased awareness of safety   Problem Solving: Requires verbal cues;Slow processing        Exercises      General Comments General comments (skin integrity, edema, etc.): no interpreter used; pt speaks engligh well enough for simple tasks; ipad interpreter still broken on unit      Pertinent Vitals/Pain Pain Assessment: Faces Faces Pain Scale: Hurts a little bit Pain Location: stomach Pain Descriptors / Indicators: Discomfort Pain Intervention(s): Limited activity within patient's tolerance;Monitored during session;Repositioned    Home Living                      Prior Function            PT Goals (  current goals can now be found in the care plan section) Progress towards PT goals: Progressing toward goals    Frequency    Min 3X/week      PT Plan Current plan remains appropriate    Co-evaluation              AM-PAC PT "6 Clicks" Mobility   Outcome Measure  Help needed turning from your back to your side while in a flat bed without using bedrails?: A  Little Help needed moving from lying on your back to sitting on the side of a flat bed without using bedrails?: A Little Help needed moving to and from a bed to a chair (including a wheelchair)?: A Little Help needed standing up from a chair using your arms (e.g., wheelchair or bedside chair)?: A Little Help needed to walk in hospital room?: A Little Help needed climbing 3-5 steps with a railing? : A Lot 6 Click Score: 17    End of Session Equipment Utilized During Treatment: Gait belt Activity Tolerance: Patient tolerated treatment well Patient left: in chair;with chair alarm set;with call bell/phone within reach;with nursing/sitter in room Nurse Communication: Mobility status PT Visit Diagnosis: Other abnormalities of gait and mobility (R26.89);Unsteadiness on feet (R26.81);Muscle weakness (generalized) (M62.81)     Time: 8527-7824 PT Time Calculation (min) (ACUTE ONLY): 21 min  Charges:  $Gait Training: 8-22 mins                     Earney Navy, PTA Acute Rehabilitation Services Pager: [phone] Office: [phone]     Darliss Cheney 07/22/2018, 1:12 PM

## 2018-07-22 NOTE — Progress Notes (Signed)
CSW received request for patient to sign release form for DSS to submit his Medicaid application. Patient signed form and CSW emailed it securely back to [facility] with [place].  Percell Locus Georges Victorio LCSW [phone]

## 2018-07-23 DIAGNOSIS — E876 Hypokalemia: Secondary | ICD-10-CM

## 2018-07-23 LAB — TYPE AND SCREEN
ABO/RH(D): B POS
Antibody Screen: NEGATIVE
Unit division: 0

## 2018-07-23 LAB — CBC
HCT: 25.6 % — ABNORMAL LOW (ref 39.0–52.0)
Hemoglobin: 8.2 g/dL — ABNORMAL LOW (ref 13.0–17.0)
MCH: 27.4 pg (ref 26.0–34.0)
MCHC: 32 g/dL (ref 30.0–36.0)
MCV: 85.6 fL (ref 80.0–100.0)
Platelets: 293 10*3/uL (ref 150–400)
RBC: 2.99 MIL/uL — ABNORMAL LOW (ref 4.22–5.81)
RDW: 18.2 % — ABNORMAL HIGH (ref 11.5–15.5)
WBC: 16.4 10*3/uL — ABNORMAL HIGH (ref 4.0–10.5)
nRBC: 0 % (ref 0.0–0.2)

## 2018-07-23 LAB — COMPREHENSIVE METABOLIC PANEL
ALT: 14 U/L (ref 0–44)
AST: 15 U/L (ref 15–41)
Albumin: 1.3 g/dL — ABNORMAL LOW (ref 3.5–5.0)
Alkaline Phosphatase: 75 U/L (ref 38–126)
Anion gap: 5 (ref 5–15)
BUN: 13 mg/dL (ref 6–20)
CO2: 23 mmol/L (ref 22–32)
Calcium: 7.2 mg/dL — ABNORMAL LOW (ref 8.9–10.3)
Chloride: 104 mmol/L (ref 98–111)
Creatinine, Ser: 0.38 mg/dL — ABNORMAL LOW (ref 0.61–1.24)
GFR calc Af Amer: >60 mL/min (ref >60)
GFR calc non Af Amer: >60 mL/min (ref >60)
Glucose, Bld: 107 mg/dL — ABNORMAL HIGH (ref 70–99)
Potassium: 3.8 mmol/L (ref 3.5–5.1)
Sodium: 132 mmol/L — ABNORMAL LOW (ref 135–145)
Total Bilirubin: 0.6 mg/dL (ref 0.3–1.2)
Total Protein: 5 g/dL — ABNORMAL LOW (ref 6.5–8.1)

## 2018-07-23 LAB — PROCALCITONIN: Procalcitonin: 0.11 ng/mL

## 2018-07-23 LAB — GLUCOSE, CAPILLARY
Glucose-Capillary: 106 mg/dL — ABNORMAL HIGH (ref 70–99)
Glucose-Capillary: 111 mg/dL — ABNORMAL HIGH (ref 70–99)
Glucose-Capillary: 116 mg/dL — ABNORMAL HIGH (ref 70–99)
Glucose-Capillary: 126 mg/dL — ABNORMAL HIGH (ref 70–99)
Glucose-Capillary: 131 mg/dL — ABNORMAL HIGH (ref 70–99)
Glucose-Capillary: 148 mg/dL — ABNORMAL HIGH (ref 70–99)

## 2018-07-23 LAB — BPAM RBC
Blood Product Expiration Date: 202005282359
ISSUE DATE / TIME: 202005200935
Unit Type and Rh: 7300

## 2018-07-23 LAB — MAGNESIUM: Magnesium: 2.1 mg/dL (ref 1.7–2.4)

## 2018-07-23 LAB — PHOSPHORUS: Phosphorus: 2.4 mg/dL — ABNORMAL LOW (ref 2.5–4.6)

## 2018-07-23 MED ORDER — TRAVASOL 10 % IV SOLN
INTRAVENOUS | Status: AC
  Administered 2018-07-23: 18:00:00 via INTRAVENOUS
  Filled 2018-07-23: qty 878.4

## 2018-07-23 NOTE — Progress Notes (Signed)
[facility]                                                                              Patient Demographics  Brandon Landry, is a 53 y.o. male, DOB - 01-31-1966, WUX:[id number]  Admit date - 07/19/2018   Admitting Physician Ivor Costa, MD  Outpatient Primary MD for the patient is Patient, No Pcp Per  Outpatient specialists:   LOS - 4  days   Medical records reviewed and are as summarized below:    No chief complaint on file.      Brief summary  Per HPI: Patient is a35 y.o.malewho was hospitalized from 5/4 to 5/6 after he was found to have severe microcytic anemia with a hemoglobin of 3.1, he was transfused PRBC, CT scan of the abdomen on 5/4 showed a large fungating mass in the ascending colon with contained perforation and adjacent abscess formation, general surgery was subsequently consulted-surgical intervention was contemplated-however patient refused and was subsequently discharged home on 5/6. He returned [facility] on 5/16 with generalized weakness, and was found to have a hemoglobin of 3.2. He was transfused 2 units of PRBC, and transferred to [facility]. He now wants to pursue surgical options. General surgery was consulted, cannot undergo surgery due to severe malnutrition, patient refuses to eat American food.  Palliative care was consulted, currently working with patient and family regarding goals of care.  PICC line has been placed and patient has been started on TPN for nourishment. Patient now wishes to go to [place] for all of his medical care, being investigated by palliative medicine. 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  Initially, there appeared to be a thrombus in the ascending aorta versus dissection.  He was offered CT chest with angiogram on 5/18 and refused, but was agreeable on 5/19.  Assessment & Plan    Principal Problem: Severe microcytic  symptomatic anemia -Presented with hemoglobin of 3.2, secondary to chronic blood loss, acute illness, colon mass -Patient has received 2 units packed RBC transfusion at [facility], continue supportive care -Transfused 1 unit packed RBC on 5/20, hemoglobin stable 8.2 today   Active problems Large fungating mass in the ascending colon with probable perforation and adjacent abscess -Currently no abdominal pain or fevers -Repeat CT showed large fungating mass in the right hemiabdomen centered around the ascending colon, invasion of the second and third portions of the duodenum, ascites -General surgery has signed off, patient has not been cooperative and would like to pursue further management in [place] -Continue IV Zosyn, WBC count improving, -Continue TPN via PICC line   Left arm weakness, bilateral strokes -MRI brain showed numerous punctate foci of acute/early subacute infarctions in the right greater than left posterior cerebral hemispheres, right cerebellar hemispheres, no enhancing metastasis. -CT chest showed small bilateral pleural effusions with mural filling defect along the left right lateral wall of ascending thoracic aorta concerning for thrombus -CTA aorta showed several areas of mural thickening and intimal irregularity noted in the ascending aorta favoring infectious vegetation/thrombus -2D echo showed EF of 40 to  45% with low flow state LV recommend rule out thrombus -Neurology following, not on antithrombotic agents given severe anemia.  Not a candidate for anticoagulation unless anemia and bleeding source is secured.  However patient has refused surgery for the last fungating colon mass.  Ascending aorta vegetation/thrombus -Neurology following, CTA aorta showed several areas of mural thickening and intimal irregularity in the ascending aorta -Likely related to advanced metastatic colon cancer, not a candidate for anticoagulation due to severe anemia and refusal for the  surgery or further management -Recommended palliative and hospice at this time  Left arm swelling -Doppler studies negative for DVT  Severe malnutrition -Appreciate dietitian consultation, continue TPN -Discussed with the patient regarding eating food, he wants to try, order placed  Goals of care -Palliative medicine following, overall poor prognosis -Unfortunately no local support due to recent divorce and immediate family in [place] in [place].  Patient will either need hospice or SNF/LTAC while awaiting disposition to [place] which might be significantly delayed due to ongoing pandemic.   Code Status: Full code DVT Prophylaxis:  SCD's Family Communication: Discussed in detail with the patient, all imaging results, lab results explained to the patient.  Palliative medicine has been in contact with his family in [place] and [place].    Disposition Plan: Patient will need skilled nursing facility/LTAC until further disposition to [place] is established  Time Spent in minutes 25 minutes  Procedures:  None  Consultants:   General surgery Palliative medicine Neurology  Antimicrobials:   Anti-infectives (From admission, onward)   Start     Dose/Rate Route Frequency Ordered Stop   07/19/18 1400  piperacillin-tazobactam (ZOSYN) IVPB 3.375 g     3.375 g 12.5 mL/hr over 240 Minutes Intravenous Every 8 hours 07/19/18 0651     07/19/18 0600  piperacillin-tazobactam (ZOSYN) IVPB 3.375 g  Status:  Discontinued     3.375 g 12.5 mL/hr over 240 Minutes Intravenous Every 8 hours 07/19/18 0442 07/19/18 0447   07/19/18 0500  piperacillin-tazobactam (ZOSYN) IVPB 3.375 g     3.375 g 100 mL/hr over 30 Minutes Intravenous  Once 07/19/18 0447 07/19/18 1445         Medications  Scheduled Meds:  sodium chloride   Intravenous Once   sodium chloride   Intravenous Once   sodium chloride   Intravenous Once   feeding supplement (PRO-STAT SUGAR FREE 64)  30 mL Oral BID   insulin aspart  0-9  Units Subcutaneous Q6H   nicotine  21 mg Transdermal Daily   [START ON 07/24/2018] thiamine  100 mg Oral Daily   thiamine  500 mg Oral TID   Continuous Infusions:  sodium chloride 50 mL/hr at 07/22/18 0450   sodium chloride 50 mL/hr at 07/21/18 1800   piperacillin-tazobactam (ZOSYN)  IV 3.375 g (07/23/18 0538)   TPN ADULT (ION) 45 mL/hr at 07/22/18 1734   TPN ADULT (ION)     PRN Meds:.sodium chloride, acetaminophen **OR** acetaminophen, ondansetron **OR** ondansetron (ZOFRAN) IV, sodium chloride flush      Subjective:   [place] was seen and examined today.  No complaints, says he wants to be stronger, wants to try food in the hospital.  No nausea or vomiting or diarrhea.  No fevers, no acute events overnight.    Objective:   Vitals:   07/22/18 1230 07/22/18 2254 07/23/18 0436 07/23/18 0540  BP: 108/83 117/86  120/90  Pulse: 85 86  90  Resp: 17 16  18   Temp: (!) 97.4 F (36.3 C) (!)  97.4 F (36.3 C)  97.6 F (36.4 C)  TempSrc: Oral Oral    SpO2: 100% 100%  100%  Weight:   53 kg     Intake/Output Summary (Last 24 hours) at 07/23/2018 1334 Last data filed at 07/23/2018 1316 Gross per 24 hour  Intake 799.47 ml  Output 400 ml  Net 399.47 ml     Wt Readings from Last 3 Encounters:  07/23/18 53 kg  07/06/18 50.1 kg   Physical Exam  General: Alert and oriented x 3, NAD, somewhat cachectic, limited English, more pleasant today  Eyes:   HEENT:  Atraumatic, normocephalic  Cardiovascular: S1 S2 clear, RRR. No pedal edema b/l  Respiratory: CTAB, no wheezing, rales or rhonchi  Gastrointestinal: Soft, nontender, nondistended, NBS  Ext: no pedal edema bilaterally  Neuro: no new deficits  Musculoskeletal: No cyanosis, clubbing  Skin: No rashes  Psych: Normal affect and demeanor, alert and oriented x3      Data Reviewed:  I have personally reviewed following labs and imaging studies  Micro Results Recent Results (from the past 240 hour(s))    Culture, blood (Routine X 2) w Reflex to ID Panel     Status: None (Preliminary result)   Collection Time: 07/19/18  5:34 AM  Result Value Ref Range Status   Specimen Description BLOOD RIGHT ARM  Final   Special Requests   Final    BOTTLES DRAWN AEROBIC ONLY Blood Culture adequate volume   Culture   Final    NO GROWTH 4 DAYS Performed at [facility], [address], [place], [place] [address]    Report Status PENDING  Incomplete  Culture, blood (Routine X 2) w Reflex to ID Panel     Status: None (Preliminary result)   Collection Time: 07/19/18  5:34 AM  Result Value Ref Range Status   Specimen Description BLOOD LEFT ANTECUBITAL  Final   Special Requests   Final    BOTTLES DRAWN AEROBIC ONLY Blood Culture results may not be optimal due to an inadequate volume of blood received in culture bottles   Culture   Final    NO GROWTH 4 DAYS Performed at [facility], [place] [address] [place], [place] [address]    Report Status PENDING  Incomplete    Radiology Reports Ct Chest W Contrast  Result Date: 07/20/2018 CLINICAL DATA:  Colon cancer. EXAM: CT CHEST, ABDOMEN, AND PELVIS WITH CONTRAST TECHNIQUE: Multidetector CT imaging of the chest, abdomen and pelvis was performed following the standard protocol during bolus administration of intravenous contrast. CONTRAST:  1106mL OMNIPAQUE IOHEXOL 300 MG/ML  SOLN COMPARISON:  07/06/2018 FINDINGS: CT CHEST FINDINGS Cardiovascular: The heart size is normal. Calcification in the LAD coronary artery. There is a 2.5 cm low-attenuation filling defect arising from the right lateral wall of the ascending thoracic aorta concerning for thrombus. Mediastinum/Nodes: Normal appearance of the thyroid gland. The trachea appears patent and is midline. Normal appearance of the esophagus. No mediastinal or hilar adenopathy. No axillary or supraclavicular adenopathy. Lungs/Pleura: Small bilateral pleural effusions, similar. Moderate changes of centrilobular and  paraseptal emphysema. Scar like density identified in the right apex. No suspicious pulmonary nodule or mass identified. Musculoskeletal: No aggressive lytic or sclerotic bone lesions. CT ABDOMEN PELVIS FINDINGS Hepatobiliary: Low-density structure in lateral segment of left lobe of liver is unchanged measuring 9 mm, image 63/3. No new liver abnormality identified. Pancreas: Unremarkable. No pancreatic ductal dilatation or surrounding inflammatory changes. Spleen: Normal in size. Small peripheral wedge-shaped areas  of low attenuation concerning for splenic infarcts. Adrenals/Urinary Tract: Normal appearance of the adrenal glands. There are several small right kidney lesions, too small to reliably characterize. No hydronephrosis bilaterally. Urinary bladder is negative. Stomach/Bowel: The stomach is nondistended. The proximal small bowel loops have a normal caliber. Abnormal bowel wall edema involving the terminal ileum is identified, similar to previous exam. Large necrotic mass within the right abdomen centered around the ascending colon is again noted measuring 10.2 by 8.6 by 10.1 cm (volume = 460 cm^3). Previously this measured 9.9 x 9.9 by 9.6 cm (volume = 490 cm^3). As noted previously tumor appears to involve the second and third portions of the duodenum. Distal colon unremarkable. Vascular/Lymphatic: Aortic atherosclerosis without aneurysm. No abdominopelvic adenopathy. Reproductive: Prostate is unremarkable. Other: Diffuse abdominopelvic ascites is identified. Musculoskeletal: No acute or significant osseous findings. IMPRESSION: 1. Similar size of large fungating mass within the right hemiabdomen centered around the ascending colon. Invasion of the second and third portions of the duodenum noted. 2. Ascites. 3. Small bilateral pleural effusions. Mural filling defect along the right lateral wall of the ascending thoracic aorta is concerning for thrombus, image 44/6. 4. Small splenic infarcts. Electronically  Signed   By: Kerby Moors M.D.   On: 07/20/2018 09:12   Mr Jeri Cos XB Contrast  Result Date: 07/20/2018 CLINICAL DATA:  53 y/o M; left arm weakness. Possible colon cancer. EXAM: MRI HEAD WITHOUT AND WITH CONTRAST TECHNIQUE: Multiplanar, multiecho pulse sequences of the brain and surrounding structures were obtained without and with intravenous contrast. CONTRAST:  5 cc Gadavist. COMPARISON:  None. FINDINGS: Brain: Numerous punctate foci of reduced diffusion are present throughout the right greater than left posterior frontal lobes, bilateral parietal lobes, right posterior temporal lobe, bilateral occipital lobes, and the right cerebellar hemisphere. Foci of reduced diffusion demonstrate T2 FLAIR hyperintensity. No hemorrhage. After administration of intravenous contrast there is no abnormal enhancement. No extra-axial collection, hydrocephalus, mass effect, or herniation. Vascular: Normal flow voids. Skull and upper cervical spine: Normal marrow signal. Sinuses/Orbits: Negative. Other: None. IMPRESSION: 1. Numerous punctate foci of acute/early subacute infarction are present throughout the right greater than left posterior cerebral hemispheres and the right cerebellar hemisphere. Multiple vascular territories suggests embolic etiology. No hemorrhage or mass effect. 2. No enhancing intracranial metastasis identified. These results will be called to the ordering clinician or representative by the Radiologist Assistant, and communication documented in the PACS or zVision Dashboard. Electronically Signed   By: Kristine Garbe M.D.   On: 07/20/2018 01:18   Ct Abdomen Pelvis W Contrast  Result Date: 07/20/2018 CLINICAL DATA:  Colon cancer. EXAM: CT CHEST, ABDOMEN, AND PELVIS WITH CONTRAST TECHNIQUE: Multidetector CT imaging of the chest, abdomen and pelvis was performed following the standard protocol during bolus administration of intravenous contrast. CONTRAST:  145mL OMNIPAQUE IOHEXOL 300 MG/ML   SOLN COMPARISON:  07/06/2018 FINDINGS: CT CHEST FINDINGS Cardiovascular: The heart size is normal. Calcification in the LAD coronary artery. There is a 2.5 cm low-attenuation filling defect arising from the right lateral wall of the ascending thoracic aorta concerning for thrombus. Mediastinum/Nodes: Normal appearance of the thyroid gland. The trachea appears patent and is midline. Normal appearance of the esophagus. No mediastinal or hilar adenopathy. No axillary or supraclavicular adenopathy. Lungs/Pleura: Small bilateral pleural effusions, similar. Moderate changes of centrilobular and paraseptal emphysema. Scar like density identified in the right apex. No suspicious pulmonary nodule or mass identified. Musculoskeletal: No aggressive lytic or sclerotic bone lesions. CT ABDOMEN PELVIS FINDINGS Hepatobiliary: Low-density structure  in lateral segment of left lobe of liver is unchanged measuring 9 mm, image 63/3. No new liver abnormality identified. Pancreas: Unremarkable. No pancreatic ductal dilatation or surrounding inflammatory changes. Spleen: Normal in size. Small peripheral wedge-shaped areas of low attenuation concerning for splenic infarcts. Adrenals/Urinary Tract: Normal appearance of the adrenal glands. There are several small right kidney lesions, too small to reliably characterize. No hydronephrosis bilaterally. Urinary bladder is negative. Stomach/Bowel: The stomach is nondistended. The proximal small bowel loops have a normal caliber. Abnormal bowel wall edema involving the terminal ileum is identified, similar to previous exam. Large necrotic mass within the right abdomen centered around the ascending colon is again noted measuring 10.2 by 8.6 by 10.1 cm (volume = 460 cm^3). Previously this measured 9.9 x 9.9 by 9.6 cm (volume = 490 cm^3). As noted previously tumor appears to involve the second and third portions of the duodenum. Distal colon unremarkable. Vascular/Lymphatic: Aortic atherosclerosis  without aneurysm. No abdominopelvic adenopathy. Reproductive: Prostate is unremarkable. Other: Diffuse abdominopelvic ascites is identified. Musculoskeletal: No acute or significant osseous findings. IMPRESSION: 1. Similar size of large fungating mass within the right hemiabdomen centered around the ascending colon. Invasion of the second and third portions of the duodenum noted. 2. Ascites. 3. Small bilateral pleural effusions. Mural filling defect along the right lateral wall of the ascending thoracic aorta is concerning for thrombus, image 44/6. 4. Small splenic infarcts. Electronically Signed   By: Kerby Moors M.D.   On: 07/20/2018 09:12   Ct Abdomen Pelvis W Contrast  Result Date: 07/06/2018 CLINICAL DATA:  Left lower quadrant pain EXAM: CT ABDOMEN AND PELVIS WITH CONTRAST TECHNIQUE: Multidetector CT imaging of the abdomen and pelvis was performed using the standard protocol following bolus administration of intravenous contrast. CONTRAST:  148mL OMNIPAQUE IOHEXOL 300 MG/ML  SOLN COMPARISON:  None. FINDINGS: Lower chest: Small bilateral pleural effusions are noted right greater than left with mild bibasilar atelectatic changes. Hepatobiliary: Liver is well visualized and within normal limits. The gallbladder is well distended without cholelithiasis. Pancreas: Unremarkable. No pancreatic ductal dilatation or surrounding inflammatory changes. Spleen: Normal in size without focal abnormality. Adrenals/Urinary Tract: Adrenal glands are within normal limits. The kidneys demonstrate a normal enhancement pattern and normal excretion bilaterally. A small cortical cyst is noted within the right kidney measuring approximately 1 cm. The bladder is well distended. Stomach/Bowel: The distal colon appears within normal limits. In the ascending colon there is a large colonic mass with changes highly suggestive of contained perforation. This measures approximately 9.9 x 9.9 cm in greatest AP and transverse dimensions.  This extends along the margin of the second portion of the duodenum and duodenal wall thickening is noted suggestive of localized invasion. Mottled air fluid collection is noted adjacent to the column contrast which likely represents contained perforation. Free fluid is noted within the pelvis consistent with the perforation. The appendix is within normal limits. The jejunum and ileum are within normal limits with the exception of some wall thickening in the distal ileum related to the localized inflammatory change from the colonic mass. Vascular/Lymphatic: Aortic atherosclerosis. No enlarged abdominal or pelvic lymph nodes. Reproductive: Prostate is unremarkable. Other: Free fluid is noted within the pelvis as previously described. This also extends higher in the abdomen along the spleen and liver consistent with the findings in the ascending colon consistent with contained perforation. Musculoskeletal: No acute bony abnormality is seen. IMPRESSION: Large fungating mass in the ascending colon with changes highly suggestive of contained perforation and adjacent abscess formation.  This extends along the margin of the second portion of the duodenum in the possibility of localized invasion of the mass deserves consideration. Surgical consultation is recommended. Free fluid within the abdomen consistent with the known perforated mass. No definitive extravasation of contrast material is noted at this time consistent with a more contained perforation. Bilateral pleural effusions with mild bibasilar atelectasis. Electronically Signed   By: Inez Catalina M.D.   On: 07/06/2018 17:55   Ct Angio Chest Aorta W/cm &/or Wo/cm  Result Date: 07/21/2018 CLINICAL DATA:  53 year old male with history of embolic strokes. Possible thrombus in the ascending thoracic aorta noted on prior non gated chest CT. Follow-up study. EXAM: CT ANGIOGRAPHY CHEST WITH CONTRAST TECHNIQUE: Multidetector CT imaging of the chest was performed using the  standard protocol as well as cardiac gating during bolus administration of intravenous contrast. Multiplanar CT image reconstructions and MIPs were obtained to evaluate the vascular anatomy. CONTRAST:  130mL OMNIPAQUE IOHEXOL 350 MG/ML SOLN COMPARISON:  Chest CT 05/18/20209. FINDINGS: Cardiovascular: Heart size is enlarged with mild left ventricular dilatation. There is also some mild myocardial thinning in the apex of the left ventricle which appears rounded, suggesting sequela of prior distal LAD territory myocardial infarction. There is no significant pericardial fluid, thickening or pericardial calcification. Aortic atherosclerosis, as well as calcified atherosclerotic plaque in the left anterior descending coronary artery. Several areas of mural thickening and intimal irregularity are noted in the ascending thoracic aorta. The first of these is at and immediately above the level of the sino-tubular junction best appreciated on axial image 100 of series 14, where this focal mural thickening of the posterolateral wall of the ascending thoracic aorta on the right side extends into the lumen over a distance of approximately 11 mm craniocaudally (coronal image 31 of series 18). The second intimal irregularity is best appreciated on axial image 60 of series 14 also in the proximal ascending thoracic aorta slightly cranial to the previously described lesion, without much extension into the lumen. The final lesion is much larger, best appreciated on axial image 24 of series 14 and coronal image 63 of series 10 where a focal area of mural thickening along the right lateral wall of the ascending thoracic aorta measures up to approximately 10 x 8 mm, and a pedunculated portion of the lesion extends into the lumen cephalad over distance of approximately 3.3 cm toward the proximal aortic arch. Mediastinum/Nodes: No pathologically enlarged mediastinal or hilar lymph nodes. Esophagus is unremarkable in appearance. No axillary  lymphadenopathy. Lungs/Pleura: Small bilateral pleural effusions lying dependently with some associated passive subsegmental atelectasis in the lower lobes of the lungs bilaterally. No acute consolidative airspace disease. Diffuse bronchial wall thickening with moderate centrilobular and paraseptal emphysema. No definite suspicious appearing pulmonary nodules or masses are noted. Upper Abdomen: Please refer to dedicated CT the abdomen and pelvis from yesterday for full description of findings beneath the diaphragm. Musculoskeletal: There are no aggressive appearing lytic or blastic lesions noted in the visualized portions of the skeleton. Review of the MIP images confirms the above findings. IMPRESSION: 1. Three focal areas of mural thickening and intimal irregularity in the ascending thoracic aorta, as detailed above. These are highly unusual. Based on the multiplicity of these lesions, and the patient's history of large colonic mass which shows signs of pending perforation, these are strongly favored to represent infectious vegetations/thrombus. 2. Aortic atherosclerosis, in addition to left anterior descending coronary artery disease. Please note that although the presence of coronary artery calcium documents  the presence of coronary artery disease, the severity of this disease and any potential stenosis cannot be assessed on this non-gated CT examination. Assessment for potential risk factor modification, dietary therapy or pharmacologic therapy may be warranted, if clinically indicated. 3. Cardiomegaly with mild left ventricular dilatation. 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 4. Small bilateral pleural effusions lying dependently. 5. Diffuse bronchial wall thickening with moderate centrilobular and paraseptal emphysema; imaging findings suggestive of underlying COPD. Critical Value/emergent results  were called by telephone at the time of interpretation on 07/21/2018 at 2:04 pm to [place], who verbally acknowledged these results. Aortic Atherosclerosis (ICD10-I70.0) and Emphysema (ICD10-J43.9). Electronically Signed   By: Vinnie Langton M.D.   On: 07/21/2018 14:05   Vas [place] Upper Extremity Venous Duplex  Result Date: 07/20/2018 UPPER VENOUS STUDY  Indications: Swelling Comparison Study: No prior study on file for comparison Performing Technologist: Sharion Dove RVS  Examination Guidelines: A complete evaluation includes B-mode imaging, spectral Doppler, color Doppler, and power Doppler as needed of all accessible portions of each vessel. Bilateral testing is considered an integral part of a complete examination. Limited examinations for reoccurring indications may be performed as noted.  Right Findings: +----------+------------+---------+-----------+----------+-------+  RIGHT      Compressible Phasicity Spontaneous Properties Summary  +----------+------------+---------+-----------+----------+-------+  Subclavian                 Yes        Yes                         +----------+------------+---------+-----------+----------+-------+  Left Findings: +----------+------------+---------+-----------+----------+-------+  LEFT       Compressible Phasicity Spontaneous Properties Summary  +----------+------------+---------+-----------+----------+-------+  IJV                        Yes        Yes                         +----------+------------+---------+-----------+----------+-------+  Subclavian     Full        Yes        Yes                         +----------+------------+---------+-----------+----------+-------+  Axillary       Full        Yes        Yes                         +----------+------------+---------+-----------+----------+-------+  Brachial       Full        Yes        Yes                         +----------+------------+---------+-----------+----------+-------+  Radial         Full                                                +----------+------------+---------+-----------+----------+-------+  Ulnar          Full                                               +----------+------------+---------+-----------+----------+-------+  Cephalic     Partial                                     Chronic  +----------+------------+---------+-----------+----------+-------+  Basilic        Full                                               +----------+------------+---------+-----------+----------+-------+  Summary:  Right: No evidence of thrombosis in the subclavian.  Left: No evidence of deep vein thrombosis in the upper extremity. 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 Chronic partial thrombus noted in a small portion of the cephalic vein from [facility] to mid upper arm.  *See table(s) above for measurements and observations.  Diagnosing physician: Harold Barban MD Electronically signed by Harold Barban MD on 07/20/2018 at 9:11:19 AM.    Final    [place] Ekg Site Rite  Result Date: 07/21/2018 If Site Rite image not attached, placement could not be confirmed due to current cardiac rhythm.   Lab Data:  CBC: Recent Labs  Lab 07/19/18 0953 07/20/18 0337 07/21/18 0300 07/22/18 0353 07/22/18 1550 07/23/18 0431  WBC 26.2* 25.0* 21.6* 18.0*  --  16.4*  NEUTROABS 22.7*  --   --  14.9*  --   --   HGB 7.6* 7.6* 7.2* 6.6* 8.9* 8.2*  HCT 23.7* 23.6* 22.0* 21.0* 27.2* 25.6*  MCV 82.6 81.7 81.8 84.3  --  85.6  PLT 341 334 292 287  --  425   Basic Metabolic Panel: Recent Labs  Lab 07/19/18 0534 07/20/18 0337 07/21/18 0300 07/22/18 0353 07/23/18 0431  NA 135 132* 133* 131* 132*  K 3.2* 3.9 3.6 3.7 3.8  CL 104 106 104 103 104  CO2 19* 20* 22 23 23   GLUCOSE 84 85 97 95 107*  BUN 17 12 9 10 13   CREATININE 0.50* 0.54* 0.48* 0.40* 0.38*  CALCIUM 7.4* 7.2* 7.3* 7.1* 7.2*  MG 2.0 1.9 2.0 1.8 2.1  PHOS  --  2.6  --  2.8 2.4*   GFR: Estimated Creatinine  Clearance: 80.1 mL/min (A) (by C-G formula based on SCr of 0.38 mg/dL (L)). Liver Function Tests: Recent Labs  Lab 07/19/18 0534 07/20/18 0337 07/22/18 0353 07/23/18 0431  AST 13* 10* 10* 15  ALT 11 9 11 14   ALKPHOS 83 74 70 75  BILITOT 1.7* 1.3* 0.8 0.6  PROT 5.1* 5.0* 4.5* 5.0*  ALBUMIN 1.5* 1.3* 1.2* 1.3*   No results for input(s): LIPASE, AMYLASE in the last 168 hours. No results for input(s): AMMONIA in the last 168 hours. Coagulation Profile: Recent Labs  Lab 07/19/18 0534  INR 1.3*   Cardiac Enzymes: No results for input(s): CKTOTAL, CKMB, CKMBINDEX, TROPONINI in the last 168 hours. BNP (last 3 results) No results for input(s): PROBNP in the last 8760 hours. HbA1C: No results for input(s): HGBA1C in the last 72 hours. CBG: Recent Labs  Lab 07/22/18 1246 07/22/18 1815 07/23/18 0005 07/23/18 0529 07/23/18 1241  GLUCAP 99 109* 106* 148* 116*   Lipid Profile: Recent Labs    07/22/18 0353  TRIG 73   Thyroid Function Tests: No results for input(s): TSH, T4TOTAL, FREET4, T3FREE, THYROIDAB in the last 72 hours. Anemia Panel: No results for input(s): VITAMINB12, FOLATE, FERRITIN, TIBC,  IRON, RETICCTPCT in the last 72 hours. Urine analysis: No results found for: COLORURINE, APPEARANCEUR, LABSPEC, PHURINE, GLUCOSEU, HGBUR, BILIRUBINUR, KETONESUR, PROTEINUR, UROBILINOGEN, NITRITE, LEUKOCYTESUR   Masen Luallen M.D. [facility] 07/23/2018, 1:34 PM  Pager: [phone] Between 7am to 7pm - call Pager - [phone]  After 7pm go to [email or web address] - password [email or web address]  Call night coverage person covering after 7pm

## 2018-07-23 NOTE — Progress Notes (Addendum)
Occupational Therapy Treatment Patient Details Name: Brandon Landry MRN: [id number] DOB: [birth year] Today's Date: 07/23/2018    History of present illness Pt is a 53 y/o male admitted secondary to symptomatic anemia. CT of abdomen revealed right colon mass with possible extension to involve the duodenum. Pt also with LUE weakness and MRI revealed numerous punctate foci of acute/early subacute infarction present throughout the right greater than left posterior cerebral hemispheres and the right cerebellar hemisphere. PMH includes depression, iron deficiency anemia, and tobacco abuse.    OT comments  Pt performing bed mobility with minA for scooting his to EOB; sit to stand with minA for stability and pt with increased ability to lean over prior to standing. Pt minguardA for stability with RW for mobility 150' in unit with LLE slightly abducted, otherwise pt stated "I don't feel tired." Pt performing ADL at sink with set-up and supervision for safety in standing. Pt tolerating session well. Pt given UB elbow through digit ROM HEP handout and show shoulder exercises for use with gait belt. Pt requires continued OT skilled services. Pt progressing well and could go home with 24/7 care otherwise SNF for safety. OT to follow acutely.     Follow Up Recommendations  SNF;Supervision/Assistance - 24 hour(progressing. If pt had someone to care for him, d/c home.)    Equipment Recommendations  Other (comment);3 in 1 bedside commode(to be determined)    Recommendations for Other Services      Precautions / Restrictions Precautions Precautions: Fall Restrictions Weight Bearing Restrictions: No       Mobility Bed Mobility Overal bed mobility: Needs Assistance Bed Mobility: Supine to Sit     Supine to sit: Min assist;HOB elevated     General bed mobility comments: assist to move hips to EOB  Transfers Overall transfer level: Needs assistance Equipment used: Rolling walker (2 wheeled) Transfers:  Sit to/from Stand Sit to Stand: Min assist         General transfer comment: cues for safe hand placement; assist to power up into standing     Balance Overall balance assessment: Needs assistance;History of Falls Sitting-balance support: No upper extremity supported;Feet supported Sitting balance-Leahy Scale: Good     Standing balance support: Bilateral upper extremity supported Standing balance-Leahy Scale: Fair Standing balance comment: reliant on RW               High Level Balance Comments: Pt minguardA for stability           ADL either performed or assessed with clinical judgement   ADL Overall ADL's : Needs assistance/impaired     Grooming: Wash/dry hands;Wash/dry face;Oral care;Brushing hair;Set up;Supervision/safety;Standing                               Functional mobility during ADLs: Min guard;Rolling walker General ADL Comments: Pt washing up at sink for grooming tasks with fair balance with challenges. Pt performing grooming with decreased use of hand for opening/closing items, but able to stabilize with LUE.     Vision   Vision Assessment?: No apparent visual deficits   Perception     Praxis      Cognition Arousal/Alertness: Awake/alert Behavior During Therapy: WFL for tasks assessed/performed Overall Cognitive Status: Within Functional Limits for tasks assessed Area of Impairment: Problem solving  Problem Solving: Requires verbal cues;Slow processing General Comments: Pt following commands today         Exercises Exercises: Other exercises Other Exercises Other Exercises: shoulder through digits with gait belt for AAROM shoulder ROM FF and abduction; elbow flex/ext with waterbottle.   Shoulder Instructions       General Comments no interpreter used; pt speaks engligh well enough for simple tasks; ipad interpreter still broken on unit    Pertinent Vitals/ Pain       Pain  Assessment: Faces Faces Pain Scale: No hurt Pain Intervention(s): Monitored during session  Home Living                                          Prior Functioning/Environment              Frequency  Min 2X/week        Progress Toward Goals  OT Goals(current goals can now be found in the care plan section)  Progress towards OT goals: Progressing toward goals  Acute Rehab OT Goals Patient Stated Goal: none stated OT Goal Formulation: With patient Time For Goal Achievement: 08/03/18 Potential to Achieve Goals: Fair ADL Goals Pt Will Perform Eating: Independently;sitting Pt Will Perform Grooming: with min guard assist;standing Pt Will Perform Upper Body Dressing: with set-up;with supervision;sitting Pt Will Perform Lower Body Dressing: with min assist;sit to/from stand Pt Will Transfer to Toilet: with min guard assist;ambulating;bedside commode Pt Will Perform Toileting - Clothing Manipulation and hygiene: with min assist;sit to/from stand Pt Will Perform Tub/Shower Transfer: Tub transfer;3 in 1;ambulating;with modified independence;rolling walker Pt/caregiver will Perform Home Exercise Program: Left upper extremity;With minimal assist  Plan Discharge plan remains appropriate    Co-evaluation                 AM-PAC OT "6 Clicks" Daily Activity     Outcome Measure   Help from another person eating meals?: None Help from another person taking care of personal grooming?: A Little Help from another person toileting, which includes using toliet, bedpan, or urinal?: A Little Help from another person bathing (including washing, rinsing, drying)?: A Little Help from another person to put on and taking off regular upper body clothing?: A Little Help from another person to put on and taking off regular lower body clothing?: A Little 6 Click Score: 19    End of Session Equipment Utilized During Treatment: Gait belt;Rolling walker  OT Visit  Diagnosis: Other abnormalities of gait and mobility (R26.89);Muscle weakness (generalized) (M62.81);Other symptoms and signs involving cognitive function;History of falling (Z91.81);Unsteadiness on feet (R26.81)   Activity Tolerance Patient tolerated treatment well   Patient Left in chair;with call bell/phone within reach;with chair alarm set   Nurse Communication Mobility status        Time: 9417-4081 OT Time Calculation (min): 38 min  Charges: OT General Charges $OT Visit: 1 Visit OT Treatments $Self Care/Home Management : 8-22 mins $Neuromuscular Re-education: 23-37 mins  Ebony Hail Harold Hedge) Marsa Aris OTR/L Acute Rehabilitation Services Pager: [phone] Office: [place] 07/23/2018, 4:34 PM

## 2018-07-23 NOTE — Progress Notes (Signed)
Nutrition Follow-up  RD working remotely.  DOCUMENTATION CODES:   Underweight, Severe malnutrition in context of chronic illness  INTERVENTION:   -Continue 30 ml Prostat BID, each supplement provides 100 kcals and 15 grams protein -Continue Magic Cup TID with meals, each supplement provides 290 kcals and 9 grams protein -TPN management per pharmacy -Follow up for ability to transition to enteral feedings  NUTRITION DIAGNOSIS:   Severe Malnutrition related to chronic illness(colonic mass) as evidenced by energy intake < 75% for > or equal to 1 month, moderate fat depletion, severe fat depletion, moderate muscle depletion, severe muscle depletion.  Ongoing  GOAL:   Patient will meet greater than or equal to 90% of their needs  Progressing   MONITOR:   PO intake, Supplement acceptance, Labs, Weight trends, Skin, I & O's  REASON FOR ASSESSMENT:   Consult New TPN/TNA  ASSESSMENT:   Ruairi Stutsman is a 53 y.o. male with medical history significant of depression, iron deficiency anemia, tobacco abuse, who presents with generalized weakness.  5/18- s/p BSE-advanced to regular diet with thin liquids 5/19- PICC placed, TPN initiated  Reviewed I/O's: +597 ml x 24 hours and +2 L since admission  UOP: 400 ml x 24 hours  Pt remains with poor po intake; noted meal completion 20-50%. Pt refused breakfast and lunch yesterday. He did accept Prostat supplement this morning.   Pt remains on TPN- currently infusing at 45 ml/hr, which provides 1120 kcals and 66 grams protein (meeting 61% of estimated kcal needs and 63% of estimated protein needs). Per pharmacy notes, plan to increase TPN to 60 ml/hr at 1800, which will provide 1493 kcals and 88 gram of protein, meeting 81% of estimated kcal needs and 84% of estimated protein needs.   RD attempted to obtain nutrition history and discuss possible feeding tube placement with pt yesterday, however, pt was very tangential and appeared very  overwhelmed with current health status. Please review noted dated 07/22/18 for further details. While nutritional support via enteral route would be most ideal for pt (due to functioning GI tract), unsure if pt would agree to a temporary feeding tube (such as cortrak) at this time. Additionally, if pt is to go home or to SNF in the immediate future, a short term feeding tube may complicate discharge disposition, unless permanent enteral access is established (also unsure of likelihood of placing permanent access such as a PEG due to pt's medically fragile state). Due to severity pf pt's malnutrition, recommend continue TPN if pt not amenable to short term feeding tube.   Labs reviewed: Na: 132 (to be increased in TPN), K and Mg WDL, Phos: 2.4 (to be increased in TPN), CBGS: 106-109 (inpatient orders for glycemic control are 0-9 units insulin aspart every 6 hours).   Diet Order:   Diet Order            Diet regular Room service appropriate? Yes with Assist; Fluid consistency: Thin  Diet effective now              EDUCATION NEEDS:   Education needs have been addressed  Skin:  Skin Assessment: Reviewed RN Assessment  Last BM:  07/22/18  Height:   Ht Readings from Last 1 Encounters:  07/06/18 5' 8.11" (1.73 m)    Weight:   Wt Readings from Last 1 Encounters:  07/23/18 53 kg    Ideal Body Weight:  70 kg  BMI:  Body mass index is 17.71 kg/m.  Estimated Nutritional Needs:   Kcal:  1850-2050  Protein:  105-120 grams  Fluid:  > 1.8 L    Bridget Sparland A. Jimmye Norman, RD, LDN, [place] Registered Dietitian II Certified Diabetes Care and Education Specialist Pager: [phone] After hours Pager: [phone]

## 2018-07-23 NOTE — Progress Notes (Signed)
Patient ID: Brandon Landry, male   DOB: 12/26/1965, 53 y.o.   MRN: [id number]  This NP visited patient at the bedside as a follow up for palliative medicine needs and emotional support.  I received a phone call from Clide Dales who tells me he is a Wellsite geologist from [place].  Apparently the family in [place] is working with this company in an attempt to get the patient from [place] to [place].  He faxed me information and forms that would need to be completed to initiate this process.  I will give these forms to Cedric Fishman with Social Work.  I also placed a call to [place] with Adult Protective Services who is the patient's case manager in [facility]; await callback.  I also placed a phone call to patient's nephew Rayfield Beem "Sam"  [phone], called me back and we had a conversation regarding seriousness of the current medical situation.     He tells me his family understands this and are willing to accept all risks possible in transport to [place].  Raised awareness to possible barrier being COVID virus and its impact on inter- national travel.    Email from Clide Dales placed in front of hard chart of the patient.  I sent an email to coordinator/ Clide Dales with [employer] with [facility] [place] [employer]'s contact information.  All communication will be handled by social work at this time.  I met with the patient at the bedside.  Appears frail and weak and is cachectic.  Review of the chart significant for hospitalization from 5/4-5/ 6 where he was found to have severe microcytic anemia with a hemoglobin of 3.1 and a CT scan of the abdomen showed a large fungating mass in the ascending colon.    Surgery was consulted at that time however the patient refused surgery, he was discharged home.  He return to [facility] on 5/16 and was found to have a hemoglobin of 3.2, he was transfused and transferred to [facility]  Surgery was consulted/ the patient is  currently a poor surgical candidate due to severe malnutrition.  PICC line was placed and patient is currently on TPN for nutritional support.  CT ABD 07-20-18 IMPRESSION: 1. Similar size of large fungating mass within the right hemiabdomen centered around the ascending colon. Invasion of the second and third portions of the duodenum noted. 2. Ascites. 3. Small bilateral pleural effusions. Mural filling defect along the right lateral wall of the ascending thoracic aorta is concerning for thrombus, image 44/6. 4. Small splenic infarcts  Neurology consulted on 5/18 MRI IMPRESSION: 1. Numerous punctate foci of acute/early subacute infarction are present throughout the right greater than left posterior cerebral hemispheres and the right cerebellar hemisphere. Multiple vascular territories suggests embolic etiology. No hemorrhage or mass effect. 2. No enhancing intracranial metastasis identified.  HIGH risk for decompensation  I met with the patient along with an interpreter in hopes of establishing a clearer understanding of the GOCs.  Patient has poor insight into his overall medical condition.  He tells me he would refuse surgery, chemotherapy or radiation "if" he has cancer.  The interpreter para phrases ( the patient is paranoid at times to talk to staff, tells her he signed a paper and he thinks that someone will try to take his money, has heard sounds from the ceiling affecting his phone)    he tells the interpreter he wants to discharge from the hospital, go home and he will get a  ticket to [place] himself, been reflecting little insight into the overall situation.  Discussed with Dr Tana Coast and Nadia/LCSW and Micheline Rough MD  Total time spent on the unit was 180 minutes  Greater than 50% of the time was spent in counseling and coordination of care  Wadie Lessen NP  Palliative Medicine Team Team Phone # [phone] Pager [phone]

## 2018-07-23 NOTE — Progress Notes (Signed)
[place] NOTE   Pharmacy Consult for TPN Indication: Severe malnutrition; R-colon mass extending into duodenum   Patient Measurements: Weight: 116 lb 13.5 oz (53 kg)   Body mass index is 17.71 kg/m.   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 He is very malnourished and refuses to eat (only wants [place]). He is ok with TPN and needs improved nutrition if going to have surgery. Palliative care on board.   GI: Pre-albumin <5.  Endo: CBGs 92-114 with start of TPN. Insulin requirements in the past 24 hours: 0 units Lytes: Na low 132, K 3.8 Phos 2.4 Renal: SCr 0.38, BUN 13.  Pulm: no issues Cards: VSS Hepatobil: LFT/Tbili/TG within normal limits.  Neuro: pain 0-2. High dose Thiamine 500mg  TID ID: WBC 16 (decreasing) on Zosyn. Afebrile. PCT 0.10.  TPN Access: PICC  TPN start date: 07/21/18 Nutritional Goals (per RD recommendation on 5/19): KCal: 1850-2050 Protein: 105-120g Fluid: >1.8L  Goal TPN rate is 75 ml/hr (provides 110g protein, 54g lipids, and 261g dextrose, 1867 kcal meeting 100% of needs)  Current Nutrition:  Regular diet- refuses to eat Ensure Enlive - 1 yesterday  Plan:  Increase TPN to 60 mL/hr. Advancing slowly due to risk of refeeding.  This TPN provides 88 g of protein, 208 g of dextrose, and 43 g of lipids which provides 1493 kCals per day, meeting ~80% of patient needs Electrolytes in TPN: increase Na and slightly increase Phos and Mg, others standard for now. DZ:[id number] 1:1.  Add MVI to TPN Trace Elements on MWF only due to shortage Continue sensitive SSI q6h and adjust as needed Continue Thiamine 500mg  po TID as oral medication.  Monitor TPN labs Follow-up if patient would be amendable to tube placement for enteral feeding as this would be best with functioning GI tract Follow-up plan of care  Levester Fresh, PharmD, BCPS,  BCCCP Clinical Pharmacist [phone]  Please check AMION for all [place] numbers  07/23/2018 8:28 AM

## 2018-07-24 ENCOUNTER — Other Ambulatory Visit: Payer: Self-pay

## 2018-07-24 LAB — CULTURE, BLOOD (ROUTINE X 2)
Culture: NO GROWTH
Culture: NO GROWTH
Special Requests: ADEQUATE

## 2018-07-24 LAB — GLUCOSE, CAPILLARY
Glucose-Capillary: 121 mg/dL — ABNORMAL HIGH (ref 70–99)
Glucose-Capillary: 96 mg/dL (ref 70–99)
Glucose-Capillary: 103 mg/dL — ABNORMAL HIGH (ref 70–99)

## 2018-07-24 LAB — BASIC METABOLIC PANEL
Anion gap: 7 (ref 5–15)
BUN: 13 mg/dL (ref 6–20)
CO2: 24 mmol/L (ref 22–32)
Calcium: 7.2 mg/dL — ABNORMAL LOW (ref 8.9–10.3)
Chloride: 102 mmol/L (ref 98–111)
Creatinine, Ser: 0.34 mg/dL — ABNORMAL LOW (ref 0.61–1.24)
GFR calc Af Amer: >60 mL/min (ref >60)
GFR calc non Af Amer: >60 mL/min (ref >60)
Glucose, Bld: 108 mg/dL — ABNORMAL HIGH (ref 70–99)
Potassium: 3.8 mmol/L (ref 3.5–5.1)
Sodium: 133 mmol/L — ABNORMAL LOW (ref 135–145)

## 2018-07-24 MED ORDER — TRAVASOL 10 % IV SOLN
INTRAVENOUS | Status: AC
  Administered 2018-07-24: 17:00:00 via INTRAVENOUS
  Filled 2018-07-24: qty 1098

## 2018-07-24 NOTE — Progress Notes (Signed)
Palliative Medicine RN Note: Patient discussed extensively with Dr Hilma Favors, PMT Medical Director, and Dr Micheline Rough, supervising weekend physician for PMT. At this time, PMT is not able to sign off on Brandon Landry's fitness for transport. We will monitor his situation over the weekend, but require several days to see if he is stable enough to fly.   I have contacted [facility] [place] to give update. Our team will shadow his progress through Tuesday and will re-assess at that time.  Brandon Skiff Bartlett Enke, RN, BSN, [facility] Palliative Medicine Team 07/24/2018 9:30 AM Office [phone]

## 2018-07-24 NOTE — Progress Notes (Signed)
[place] NOTE   Pharmacy Consult for TPN Indication: Severe malnutrition; R-colon mass extending into duodenum   Patient Measurements: Weight: 115 lb 15.4 oz (52.6 kg)   Body mass index is 17.58 kg/m.   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 He is very malnourished and refuses to eat (only wants [place]). He is ok with TPN and needs improved nutrition if going to have surgery. Palliative care on board.   GI: Pre-albumin <5. LBM 5/20 Endo: CBGs 108-131 with start of TPN. Insulin requirements in the past 24 hours: 1 unit Lytes: Na low 133, K 3.8 Phos 2.4, Mag 2.1 Renal: SCr 0.34, BUN 13 Pulm: no issues Cards: VSS Hepatobil: LFT/Tbili/TG within normal limits.  Neuro: pain 0-2. High dose Thiamine 500mg  TID ID: WBC 16 (decreasing) on Zosyn. Afebrile. PCT 0.10.  TPN Access: PICC  TPN start date: 07/21/18 Nutritional Goals (per RD recommendation on 5/19): KCal: 1850-2050 Protein: 105-120g Fluid: >1.8L  Goal TPN rate is 75 ml/hr (provides 110g protein, 54g lipids, and 261g dextrose, 1867 kcal meeting 100% of needs)  Current Nutrition:  Regular diet- 10% of 1 meal and 75% of another Ensure Enlive - 0 yesterday  Plan:  Increase TPN to 75 mL/hr.  This TPN provides 110g protein, 54g lipids, and 261g dextrose, 1867 kcal meeting 100% of needs Electrolytes in TPN: increase Na and slightly increase Phos and Mg, others standard for now. PN:TIRWERX 1:1.  Add MVI to TPN Trace Elements on MWF only due to shortage Continue sensitive SSI q6h and adjust as needed Continue Thiamine 500mg  po TID as oral medication.  Monitor TPN labs Will order BMP, Mag and Phos for am labs tomorrow Follow-up if patient would be amendable to tube placement for enteral feeding as this would be best with functioning GI tract Follow-up plan of care  Alanda Slim, PharmD,  [facility] Clinical Pharmacist Please see AMION for all Pharmacists' Contact Phone Numbers 07/24/2018, 7:30 AM

## 2018-07-24 NOTE — Progress Notes (Signed)
[facility]                                                                              Brandon Landry Demographics  Brandon Landry, is a 53 y.o. male, DOB - 1965/06/23, QHU:[id number]  Admit date - 07/19/2018   Admitting Physician Ivor Costa, MD  Outpatient Primary MD for the Brandon Landry is Brandon Landry, No Pcp Per  Outpatient specialists:   LOS - 5  days   Medical records reviewed and are as summarized below:    No chief complaint on file.      Brief summary  Per HPI: Brandon Landry is a10 y.o.malewho was hospitalized from 5/4 to 5/6 after he was found to have severe microcytic anemia with a hemoglobin of 3.1, he was transfused PRBC, CT scan of the abdomen on 5/4 showed a large fungating mass in the ascending colon with contained perforation and adjacent abscess formation, general surgery was subsequently consulted-surgical intervention was contemplated-however Brandon Landry refused and was subsequently discharged home on 5/6. He returned [facility] on 5/16 with generalized weakness, and was found to have a hemoglobin of 3.2. He was transfused 2 units of PRBC, and transferred to [facility]. He now wants to pursue surgical options. General surgery was consulted, cannot undergo surgery due to severe malnutrition, Brandon Landry refuses to eat American food.  Palliative care was consulted, currently working with Brandon Landry and family regarding goals of care.  PICC line has been placed and Brandon Landry has been started on TPN for nourishment. Brandon Landry now wishes to go to [place] for all of his medical care, being investigated by palliative medicine. 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  Initially, there appeared to be a thrombus in the ascending aorta versus dissection.  He was offered CT chest with angiogram on 5/18 and refused, but was agreeable on 5/19.  Assessment & Plan    Principal Problem: Severe microcytic  symptomatic anemia -Presented with hemoglobin of 3.2, secondary to chronic blood loss, acute illness, colon mass -Brandon Landry has received 2 units packed RBC transfusion at [facility], continue supportive care -Transfused 1 unit packed RBC on 5/20, H&H stable, will recheck CBC in a.m.   Active problems Large fungating mass in the ascending colon with probable perforation and adjacent abscess -Currently no abdominal pain or fevers -Repeat CT showed large fungating mass in the right hemiabdomen centered around the ascending colon, invasion of the second and third portions of the duodenum, ascites -General surgery has signed off, Brandon Landry has not been cooperative and would like to pursue further management in [place] -Continue IV Zosyn, TNA with the PICC line   Left arm weakness, bilateral strokes -MRI brain showed numerous punctate foci of acute/early subacute infarctions in the right greater than left posterior cerebral hemispheres, right cerebellar hemispheres, no enhancing metastasis. -CT chest showed small bilateral pleural effusions with mural filling defect along the left right lateral wall of ascending thoracic aorta concerning for thrombus -CTA aorta showed several areas of mural thickening and intimal irregularity noted in the ascending aorta favoring infectious vegetation/thrombus -2D echo showed EF of 40 to  45% with low flow state LV recommend rule out thrombus -Neurology following, not on antithrombotic agents given severe anemia.  Not a candidate for anticoagulation unless anemia and bleeding source is secured.  However Brandon Landry has refused surgery for the last fungating colon mass.  Ascending aorta vegetation/thrombus -Neurology following, CTA aorta showed several areas of mural thickening and intimal irregularity in the ascending aorta -Likely related to advanced metastatic colon cancer, not a candidate for anticoagulation due to severe anemia and refusal for the surgery or further  management -Recommended palliative and hospice at this time  Left arm swelling -Doppler studies negative for DVT  Severe protein calorie malnutrition  -Appreciate dietitian consultation, continue TPN with PICC line -I had discussed with the Brandon Landry regarding improving his nutrition prior to the transfer and further management, Brandon Landry has now started eating.  This morning ate more than 50% breakfast. -Encouraged p.o. diet, continue protein supplement  Goals of care -Palliative medicine following, overall poor prognosis -Unfortunately no local support due to recent divorce and immediate family in [place] and [place].  Brandon Landry will either need hospice or SNF/LTAC while awaiting disposition to [place]  Discussed with social work and palliative medicine, Brandon Landry is also awaiting transport to [place].  - Palliative medicine will follow-up on Tuesday, if Brandon Landry is fit for travel.  -Representative from [place] has been following, Brandon Landry will be transported with the medical jet to [place] when Brandon Landry is deemed fit for travel.  Code Status: Full code DVT Prophylaxis:  SCD's Family Communication: Discussed in detail with the Brandon Landry, all imaging results, lab results explained to the Brandon Landry.  Palliative medicine has been in contact with his family in [place] and [place].    Disposition Plan: Awaiting disposition to [place].  May need skilled nursing facility/LTAC if medical travel to [place] is not feasible  Time Spent in minutes 25 minutes  Procedures:  None  Consultants:   General surgery Palliative medicine Neurology  Antimicrobials:   Anti-infectives (From admission, onward)   Start     Dose/Rate Route Frequency Ordered Stop   07/19/18 1400  piperacillin-tazobactam (ZOSYN) IVPB 3.375 g     3.375 g 12.5 mL/hr over 240 Minutes Intravenous Every 8 hours 07/19/18 0651     07/19/18 0600  piperacillin-tazobactam (ZOSYN) IVPB 3.375 g  Status:  Discontinued     3.375 g 12.5 mL/hr  over 240 Minutes Intravenous Every 8 hours 07/19/18 0442 07/19/18 0447   07/19/18 0500  piperacillin-tazobactam (ZOSYN) IVPB 3.375 g     3.375 g 100 mL/hr over 30 Minutes Intravenous  Once 07/19/18 0447 07/19/18 1445         Medications  Scheduled Meds:  sodium chloride   Intravenous Once   sodium chloride   Intravenous Once   sodium chloride   Intravenous Once   feeding supplement (PRO-STAT SUGAR FREE 64)  30 mL Oral BID   insulin aspart  0-9 Units Subcutaneous Q6H   nicotine  21 mg Transdermal Daily   thiamine  100 mg Oral Daily   Continuous Infusions:  sodium chloride 50 mL/hr at 07/22/18 0450   sodium chloride 50 mL/hr at 07/24/18 1301   piperacillin-tazobactam (ZOSYN)  IV 3.375 g (07/24/18 1303)   TPN ADULT (ION) 60 mL/hr at 07/23/18 1737   TPN ADULT (ION)     PRN Meds:.sodium chloride, acetaminophen **OR** acetaminophen, ondansetron **OR** ondansetron (ZOFRAN) IV, sodium chloride flush      Subjective:   Combined Locks was seen and examined today.  States had 2  episodes of diarrhea and abdominal discomfort.  No fevers or chills.  Ate more than 50% of his breakfast.    Objective:   Vitals:   07/23/18 2144 07/24/18 0624 07/24/18 0626 07/24/18 1321  BP: 106/81 98/67 98/67  105/79  Pulse: 84  80 78  Resp: 16  16 15   Temp: 97.7 F (36.5 C)  98 F (36.7 C) 97.7 F (36.5 C)  TempSrc: Oral   Oral  SpO2: 100%  99% 100%  Weight: 52.6 kg       Intake/Output Summary (Last 24 hours) at 07/24/2018 1516 Last data filed at 07/24/2018 1253 Gross per 24 hour  Intake 436.74 ml  Output 600 ml  Net -163.26 ml     Wt Readings from Last 3 Encounters:  07/23/18 52.6 kg  07/06/18 50.1 kg   Physical Exam  General: Alert and oriented x 3, NAD, cachectic  Eyes:   HEENT:  Atraumatic, normocephalic  Cardiovascular: S1 S2 clear, RRR. No pedal edema b/l  Respiratory: CTAB, no wheezing, rales or rhonchi  Gastrointestinal: Soft, nontender, nondistended,  NBS  Ext: no pedal edema bilaterally  Neuro: no new deficits  Musculoskeletal: No cyanosis, clubbing  Skin: No rashes  Psych: Normal affect and demeanor, alert and oriented x3      Data Reviewed:  I have personally reviewed following labs and imaging studies  Micro Results Recent Results (from the past 240 hour(s))  Culture, blood (Routine X 2) w Reflex to ID Panel     Status: None   Collection Time: 07/19/18  5:34 AM  Result Value Ref Range Status   Specimen Description BLOOD RIGHT ARM  Final   Special Requests   Final    BOTTLES DRAWN AEROBIC ONLY Blood Culture adequate volume   Culture   Final    NO GROWTH 5 DAYS Performed at [facility], [address] [place], [place] [address]    Report Status 07/24/2018 FINAL  Final  Culture, blood (Routine X 2) w Reflex to ID Panel     Status: None   Collection Time: 07/19/18  5:34 AM  Result Value Ref Range Status   Specimen Description BLOOD LEFT ANTECUBITAL  Final   Special Requests   Final    BOTTLES DRAWN AEROBIC ONLY Blood Culture results may not be optimal due to an inadequate volume of blood received in culture bottles   Culture   Final    NO GROWTH 5 DAYS Performed at [facility], [place] [address] [place], [place] [address]    Report Status 07/24/2018 FINAL  Final    Radiology Reports Ct Chest W Contrast  Result Date: 07/20/2018 CLINICAL DATA:  Colon cancer. EXAM: CT CHEST, ABDOMEN, AND PELVIS WITH CONTRAST TECHNIQUE: Multidetector CT imaging of the chest, abdomen and pelvis was performed following the standard protocol during bolus administration of intravenous contrast. CONTRAST:  164mL OMNIPAQUE IOHEXOL 300 MG/ML  SOLN COMPARISON:  07/06/2018 FINDINGS: CT CHEST FINDINGS Cardiovascular: The heart size is normal. Calcification in the LAD coronary artery. There is a 2.5 cm low-attenuation filling defect arising from the right lateral wall of the ascending thoracic aorta concerning for thrombus.  Mediastinum/Nodes: Normal appearance of the thyroid gland. The trachea appears patent and is midline. Normal appearance of the esophagus. No mediastinal or hilar adenopathy. No axillary or supraclavicular adenopathy. Lungs/Pleura: Small bilateral pleural effusions, similar. Moderate changes of centrilobular and paraseptal emphysema. Scar like density identified in the right apex. No suspicious pulmonary nodule or mass identified. Musculoskeletal: No  aggressive lytic or sclerotic bone lesions. CT ABDOMEN PELVIS FINDINGS Hepatobiliary: Low-density structure in lateral segment of left lobe of liver is unchanged measuring 9 mm, image 63/3. No new liver abnormality identified. Pancreas: Unremarkable. No pancreatic ductal dilatation or surrounding inflammatory changes. Spleen: Normal in size. Small peripheral wedge-shaped areas of low attenuation concerning for splenic infarcts. Adrenals/Urinary Tract: Normal appearance of the adrenal glands. There are several small right kidney lesions, too small to reliably characterize. No hydronephrosis bilaterally. Urinary bladder is negative. Stomach/Bowel: The stomach is nondistended. The proximal small bowel loops have a normal caliber. Abnormal bowel wall edema involving the terminal ileum is identified, similar to previous exam. Large necrotic mass within the right abdomen centered around the ascending colon is again noted measuring 10.2 by 8.6 by 10.1 cm (volume = 460 cm^3). Previously this measured 9.9 x 9.9 by 9.6 cm (volume = 490 cm^3). As noted previously tumor appears to involve the second and third portions of the duodenum. Distal colon unremarkable. Vascular/Lymphatic: Aortic atherosclerosis without aneurysm. No abdominopelvic adenopathy. Reproductive: Prostate is unremarkable. Other: Diffuse abdominopelvic ascites is identified. Musculoskeletal: No acute or significant osseous findings. IMPRESSION: 1. Similar size of large fungating mass within the right hemiabdomen  centered around the ascending colon. Invasion of the second and third portions of the duodenum noted. 2. Ascites. 3. Small bilateral pleural effusions. Mural filling defect along the right lateral wall of the ascending thoracic aorta is concerning for thrombus, image 44/6. 4. Small splenic infarcts. Electronically Signed   By: Kerby Moors M.D.   On: 07/20/2018 09:12   Mr Jeri Cos EP Contrast  Result Date: 07/20/2018 CLINICAL DATA:  53 y/o M; left arm weakness. Possible colon cancer. EXAM: MRI HEAD WITHOUT AND WITH CONTRAST TECHNIQUE: Multiplanar, multiecho pulse sequences of the brain and surrounding structures were obtained without and with intravenous contrast. CONTRAST:  5 cc Gadavist. COMPARISON:  None. FINDINGS: Brain: Numerous punctate foci of reduced diffusion are present throughout the right greater than left posterior frontal lobes, bilateral parietal lobes, right posterior temporal lobe, bilateral occipital lobes, and the right cerebellar hemisphere. Foci of reduced diffusion demonstrate T2 FLAIR hyperintensity. No hemorrhage. After administration of intravenous contrast there is no abnormal enhancement. No extra-axial collection, hydrocephalus, mass effect, or herniation. Vascular: Normal flow voids. Skull and upper cervical spine: Normal marrow signal. Sinuses/Orbits: Negative. Other: None. IMPRESSION: 1. Numerous punctate foci of acute/early subacute infarction are present throughout the right greater than left posterior cerebral hemispheres and the right cerebellar hemisphere. Multiple vascular territories suggests embolic etiology. No hemorrhage or mass effect. 2. No enhancing intracranial metastasis identified. These results will be called to the ordering clinician or representative by the Radiologist Assistant, and communication documented in the PACS or zVision Dashboard. Electronically Signed   By: Kristine Garbe M.D.   On: 07/20/2018 01:18   Ct Abdomen Pelvis W  Contrast  Result Date: 07/20/2018 CLINICAL DATA:  Colon cancer. EXAM: CT CHEST, ABDOMEN, AND PELVIS WITH CONTRAST TECHNIQUE: Multidetector CT imaging of the chest, abdomen and pelvis was performed following the standard protocol during bolus administration of intravenous contrast. CONTRAST:  179mL OMNIPAQUE IOHEXOL 300 MG/ML  SOLN COMPARISON:  07/06/2018 FINDINGS: CT CHEST FINDINGS Cardiovascular: The heart size is normal. Calcification in the LAD coronary artery. There is a 2.5 cm low-attenuation filling defect arising from the right lateral wall of the ascending thoracic aorta concerning for thrombus. Mediastinum/Nodes: Normal appearance of the thyroid gland. The trachea appears patent and is midline. Normal appearance of the esophagus. No mediastinal  or hilar adenopathy. No axillary or supraclavicular adenopathy. Lungs/Pleura: Small bilateral pleural effusions, similar. Moderate changes of centrilobular and paraseptal emphysema. Scar like density identified in the right apex. No suspicious pulmonary nodule or mass identified. Musculoskeletal: No aggressive lytic or sclerotic bone lesions. CT ABDOMEN PELVIS FINDINGS Hepatobiliary: Low-density structure in lateral segment of left lobe of liver is unchanged measuring 9 mm, image 63/3. No new liver abnormality identified. Pancreas: Unremarkable. No pancreatic ductal dilatation or surrounding inflammatory changes. Spleen: Normal in size. Small peripheral wedge-shaped areas of low attenuation concerning for splenic infarcts. Adrenals/Urinary Tract: Normal appearance of the adrenal glands. There are several small right kidney lesions, too small to reliably characterize. No hydronephrosis bilaterally. Urinary bladder is negative. Stomach/Bowel: The stomach is nondistended. The proximal small bowel loops have a normal caliber. Abnormal bowel wall edema involving the terminal ileum is identified, similar to previous exam. Large necrotic mass within the right abdomen  centered around the ascending colon is again noted measuring 10.2 by 8.6 by 10.1 cm (volume = 460 cm^3). Previously this measured 9.9 x 9.9 by 9.6 cm (volume = 490 cm^3). As noted previously tumor appears to involve the second and third portions of the duodenum. Distal colon unremarkable. Vascular/Lymphatic: Aortic atherosclerosis without aneurysm. No abdominopelvic adenopathy. Reproductive: Prostate is unremarkable. Other: Diffuse abdominopelvic ascites is identified. Musculoskeletal: No acute or significant osseous findings. IMPRESSION: 1. Similar size of large fungating mass within the right hemiabdomen centered around the ascending colon. Invasion of the second and third portions of the duodenum noted. 2. Ascites. 3. Small bilateral pleural effusions. Mural filling defect along the right lateral wall of the ascending thoracic aorta is concerning for thrombus, image 44/6. 4. Small splenic infarcts. Electronically Signed   By: Kerby Moors M.D.   On: 07/20/2018 09:12   Ct Abdomen Pelvis W Contrast  Result Date: 07/06/2018 CLINICAL DATA:  Left lower quadrant pain EXAM: CT ABDOMEN AND PELVIS WITH CONTRAST TECHNIQUE: Multidetector CT imaging of the abdomen and pelvis was performed using the standard protocol following bolus administration of intravenous contrast. CONTRAST:  147mL OMNIPAQUE IOHEXOL 300 MG/ML  SOLN COMPARISON:  None. FINDINGS: Lower chest: Small bilateral pleural effusions are noted right greater than left with mild bibasilar atelectatic changes. Hepatobiliary: Liver is well visualized and within normal limits. The gallbladder is well distended without cholelithiasis. Pancreas: Unremarkable. No pancreatic ductal dilatation or surrounding inflammatory changes. Spleen: Normal in size without focal abnormality. Adrenals/Urinary Tract: Adrenal glands are within normal limits. The kidneys demonstrate a normal enhancement pattern and normal excretion bilaterally. A small cortical cyst is noted within  the right kidney measuring approximately 1 cm. The bladder is well distended. Stomach/Bowel: The distal colon appears within normal limits. In the ascending colon there is a large colonic mass with changes highly suggestive of contained perforation. This measures approximately 9.9 x 9.9 cm in greatest AP and transverse dimensions. This extends along the margin of the second portion of the duodenum and duodenal wall thickening is noted suggestive of localized invasion. Mottled air fluid collection is noted adjacent to the column contrast which likely represents contained perforation. Free fluid is noted within the pelvis consistent with the perforation. The appendix is within normal limits. The jejunum and ileum are within normal limits with the exception of some wall thickening in the distal ileum related to the localized inflammatory change from the colonic mass. Vascular/Lymphatic: Aortic atherosclerosis. No enlarged abdominal or pelvic lymph nodes. Reproductive: Prostate is unremarkable. Other: Free fluid is noted within the pelvis as previously  described. This also extends higher in the abdomen along the spleen and liver consistent with the findings in the ascending colon consistent with contained perforation. Musculoskeletal: No acute bony abnormality is seen. IMPRESSION: Large fungating mass in the ascending colon with changes highly suggestive of contained perforation and adjacent abscess formation. This extends along the margin of the second portion of the duodenum in the possibility of localized invasion of the mass deserves consideration. Surgical consultation is recommended. Free fluid within the abdomen consistent with the known perforated mass. No definitive extravasation of contrast material is noted at this time consistent with a more contained perforation. Bilateral pleural effusions with mild bibasilar atelectasis. Electronically Signed   By: Inez Catalina M.D.   On: 07/06/2018 17:55   Ct Angio  Chest Aorta W/cm &/or Wo/cm  Result Date: 07/21/2018 CLINICAL DATA:  53 year old male with history of embolic strokes. Possible thrombus in the ascending thoracic aorta noted on prior non gated chest CT. Follow-up study. EXAM: CT ANGIOGRAPHY CHEST WITH CONTRAST TECHNIQUE: Multidetector CT imaging of the chest was performed using the standard protocol as well as cardiac gating during bolus administration of intravenous contrast. Multiplanar CT image reconstructions and MIPs were obtained to evaluate the vascular anatomy. CONTRAST:  152mL OMNIPAQUE IOHEXOL 350 MG/ML SOLN COMPARISON:  Chest CT 05/18/20209. FINDINGS: Cardiovascular: Heart size is enlarged with mild left ventricular dilatation. There is also some mild myocardial thinning in the apex of the left ventricle which appears rounded, suggesting sequela of prior distal LAD territory myocardial infarction. There is no significant pericardial fluid, thickening or pericardial calcification. Aortic atherosclerosis, as well as calcified atherosclerotic plaque in the left anterior descending coronary artery. Several areas of mural thickening and intimal irregularity are noted in the ascending thoracic aorta. The first of these is at and immediately above the level of the sino-tubular junction best appreciated on axial image 100 of series 14, where this focal mural thickening of the posterolateral wall of the ascending thoracic aorta on the right side extends into the lumen over a distance of approximately 11 mm craniocaudally (coronal image 31 of series 18). The second intimal irregularity is best appreciated on axial image 60 of series 14 also in the proximal ascending thoracic aorta slightly cranial to the previously described lesion, without much extension into the lumen. The final lesion is much larger, best appreciated on axial image 24 of series 14 and coronal image 63 of series 10 where a focal area of mural thickening along the right lateral wall of the  ascending thoracic aorta measures up to approximately 10 x 8 mm, and a pedunculated portion of the lesion extends into the lumen cephalad over distance of approximately 3.3 cm toward the proximal aortic arch. Mediastinum/Nodes: No pathologically enlarged mediastinal or hilar lymph nodes. Esophagus is unremarkable in appearance. No axillary lymphadenopathy. Lungs/Pleura: Small bilateral pleural effusions lying dependently with some associated passive subsegmental atelectasis in the lower lobes of the lungs bilaterally. No acute consolidative airspace disease. Diffuse bronchial wall thickening with moderate centrilobular and paraseptal emphysema. No definite suspicious appearing pulmonary nodules or masses are noted. Upper Abdomen: Please refer to dedicated CT the abdomen and pelvis from yesterday for full description of findings beneath the diaphragm. Musculoskeletal: There are no aggressive appearing lytic or blastic lesions noted in the visualized portions of the skeleton. Review of the MIP images confirms the above findings. IMPRESSION: 1. Three focal areas of mural thickening and intimal irregularity in the ascending thoracic aorta, as detailed above. These are highly unusual. Based  on the multiplicity of these lesions, and the Brandon Landry's history of large colonic mass which shows signs of pending perforation, these are strongly favored to represent infectious vegetations/thrombus. 2. Aortic atherosclerosis, in addition to left anterior descending coronary artery disease. Please note that although the presence of coronary artery calcium documents the presence of coronary artery disease, the severity of this disease and any potential stenosis cannot be assessed on this non-gated CT examination. Assessment for potential risk factor modification, dietary therapy or pharmacologic therapy may be warranted, if clinically indicated. 3. Cardiomegaly with mild left ventricular dilatation. Myocardial thinning in the apex of  the left ventricle which appears rounded, suggesting sequela of prior distal LAD territory myocardial infarction. 4. Small bilateral pleural effusions lying dependently. 5. Diffuse bronchial wall thickening with moderate centrilobular and paraseptal emphysema; imaging findings suggestive of underlying COPD. Critical Value/emergent results were called by telephone at the time of interpretation on 07/21/2018 at 2:04 pm to [place], who verbally acknowledged these results. Aortic Atherosclerosis (ICD10-I70.0) and Emphysema (ICD10-J43.9). Electronically Signed   By: Vinnie Langton M.D.   On: 07/21/2018 14:05   Vas [place] Upper Extremity Venous Duplex  Result Date: 07/20/2018 UPPER VENOUS STUDY  Indications: Swelling Comparison Study: No prior study on file for comparison Performing Technologist: Sharion Dove RVS  Examination Guidelines: A complete evaluation includes B-mode imaging, spectral Doppler, color Doppler, and power Doppler as needed of all accessible portions of each vessel. Bilateral testing is considered an integral part of a complete examination. Limited examinations for reoccurring indications may be performed as noted.  Right Findings: +----------+------------+---------+-----------+----------+-------+  RIGHT      Compressible Phasicity Spontaneous Properties Summary  +----------+------------+---------+-----------+----------+-------+  Subclavian                 Yes        Yes                         +----------+------------+---------+-----------+----------+-------+  Left Findings: +----------+------------+---------+-----------+----------+-------+  LEFT       Compressible Phasicity Spontaneous Properties Summary  +----------+------------+---------+-----------+----------+-------+  IJV                        Yes        Yes                         +----------+------------+---------+-----------+----------+-------+  Subclavian     Full        Yes        Yes                          +----------+------------+---------+-----------+----------+-------+  Axillary       Full        Yes        Yes                         +----------+------------+---------+-----------+----------+-------+  Brachial       Full        Yes        Yes                         +----------+------------+---------+-----------+----------+-------+  Radial         Full                                               +----------+------------+---------+-----------+----------+-------+  Ulnar          Full                                               +----------+------------+---------+-----------+----------+-------+  Cephalic     Partial                                     Chronic  +----------+------------+---------+-----------+----------+-------+  Basilic        Full                                               +----------+------------+---------+-----------+----------+-------+  Summary:  Right: No evidence of thrombosis in the subclavian.  Left: No evidence of deep vein thrombosis in the upper extremity. 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 Chronic partial thrombus noted in a small portion of the cephalic vein from [facility] to mid upper arm.  *See table(s) above for measurements and observations.  Diagnosing physician: Harold Barban MD Electronically signed by Harold Barban MD on 07/20/2018 at 9:11:19 AM.    Final    [place] Ekg Site Rite  Result Date: 07/21/2018 If Site Rite image not attached, placement could not be confirmed due to current cardiac rhythm.   Lab Data:  CBC: Recent Labs  Lab 07/19/18 0953 07/20/18 0337 07/21/18 0300 07/22/18 0353 07/22/18 1550 07/23/18 0431  WBC 26.2* 25.0* 21.6* 18.0*  --  16.4*  NEUTROABS 22.7*  --   --  14.9*  --   --   HGB 7.6* 7.6* 7.2* 6.6* 8.9* 8.2*  HCT 23.7* 23.6* 22.0* 21.0* 27.2* 25.6*  MCV 82.6 81.7 81.8 84.3  --  85.6  PLT 341 334 292 287  --  916   Basic Metabolic Panel: Recent Labs  Lab 07/19/18 0534  07/20/18 0337 07/21/18 0300 07/22/18 0353 07/23/18 0431 07/24/18 0359  NA 135 132* 133* 131* 132* 133*  K 3.2* 3.9 3.6 3.7 3.8 3.8  CL 104 106 104 103 104 102  CO2 19* 20* 22 23 23 24   GLUCOSE 84 85 97 95 107* 108*  BUN 17 12 9 10 13 13   CREATININE 0.50* 0.54* 0.48* 0.40* 0.38* 0.34*  CALCIUM 7.4* 7.2* 7.3* 7.1* 7.2* 7.2*  MG 2.0 1.9 2.0 1.8 2.1  --   PHOS  --  2.6  --  2.8 2.4*  --    GFR: Estimated Creatinine Clearance: 79.4 mL/min (A) (by C-G formula based on SCr of 0.34 mg/dL (L)). Liver Function Tests: Recent Labs  Lab 07/19/18 0534 07/20/18 0337 07/22/18 0353 07/23/18 0431  AST 13* 10* 10* 15  ALT 11 9 11 14   ALKPHOS 83 74 70 75  BILITOT 1.7* 1.3* 0.8 0.6  PROT 5.1* 5.0* 4.5* 5.0*  ALBUMIN 1.5* 1.3* 1.2* 1.3*   No results for input(s): LIPASE, AMYLASE in the last 168 hours. No results for input(s): AMMONIA in the last 168 hours. Coagulation Profile: Recent Labs  Lab 07/19/18 0534  INR 1.3*   Cardiac Enzymes: No results for input(s): CKTOTAL, CKMB, CKMBINDEX, TROPONINI in the last 168 hours. BNP (last 3 results) No results for input(s): PROBNP in the last 8760 hours. HbA1C:  No results for input(s): HGBA1C in the last 72 hours. CBG: Recent Labs  Lab 07/23/18 1241 07/23/18 1812 07/23/18 2347 07/24/18 0627 07/24/18 1159  GLUCAP 116* 131*   126* 111* 121* 96   Lipid Profile: Recent Labs    07/22/18 0353  TRIG 73   Thyroid Function Tests: No results for input(s): TSH, T4TOTAL, FREET4, T3FREE, THYROIDAB in the last 72 hours. Anemia Panel: No results for input(s): VITAMINB12, FOLATE, FERRITIN, TIBC, IRON, RETICCTPCT in the last 72 hours. Urine analysis: No results found for: COLORURINE, APPEARANCEUR, LABSPEC, PHURINE, GLUCOSEU, HGBUR, BILIRUBINUR, KETONESUR, PROTEINUR, UROBILINOGEN, NITRITE, LEUKOCYTESUR   Anavey Coombes M.D. [facility] 07/24/2018, 3:16 PM  Pager: [phone] Between 7am to 7pm - call Pager - [phone]  After 7pm go to  [email or web address] - password [email or web address]  Call night coverage person covering after 7pm

## 2018-07-24 NOTE — Progress Notes (Signed)
Occupational Therapy Treatment Patient Details Name: Brandon Landry MRN: [id number] DOB: 1965/03/28 Today's Date: 07/24/2018    History of present illness Pt is a 53 y/o male admitted secondary to symptomatic anemia. CT of abdomen revealed right colon mass with possible extension to involve the duodenum. Pt also with LUE weakness and MRI revealed numerous punctate foci of acute/early subacute infarction present throughout the right greater than left posterior cerebral hemispheres and the right cerebellar hemisphere. PMH includes depression, iron deficiency anemia, and tobacco abuse.    OT comments  Pt progressing with therapy. Pt given towel to be used as dowel for UB exercises. Pt able to understand most English. Pt performing ADL functional mobility in room with minguardA; transfers with minguardA overall for safety with RW. Pt sitting in recliner for exercises and for light grooming. Pt performing ADLs with minA overall due to weakness in LUE and LLE. Pt abduction on LLE still today, but increased coordination and fluidity of movements on L side. Pt progressing well. Pt would benefit from post acute care with OT for safety, mobility and HEP progression. OT following acutely.     Follow Up Recommendations  SNF;Supervision/Assistance - 24 hour    Equipment Recommendations  3 in 1 bedside commode    Recommendations for Other Services      Precautions / Restrictions Precautions Precautions: Fall Restrictions Weight Bearing Restrictions: No       Mobility Bed Mobility Overal bed mobility: Needs Assistance Bed Mobility: Supine to Sit     Supine to sit: Min assist;HOB elevated     General bed mobility comments: assist for trunk elevation  Transfers Overall transfer level: Needs assistance Equipment used: Rolling walker (2 wheeled) Transfers: Sit to/from Stand Sit to Stand: Min assist         General transfer comment: cues for safe hand placement; assist to power up into  standing     Balance Overall balance assessment: Needs assistance;History of Falls Sitting-balance support: No upper extremity supported;Feet supported Sitting balance-Leahy Scale: Good     Standing balance support: Bilateral upper extremity supported Standing balance-Leahy Scale: Fair Standing balance comment: reliant on RW               High Level Balance Comments: Pt minguardA for stability           ADL either performed or assessed with clinical judgement   ADL Overall ADL's : Needs assistance/impaired                                     Functional mobility during ADLs: Min guard;Rolling walker General ADL Comments: Pt not feeling well, but up for BUE HEP with AAROM and did not want to participate for further ADL.     Vision   Vision Assessment?: No apparent visual deficits   Perception     Praxis      Cognition Arousal/Alertness: Awake/alert Behavior During Therapy: WFL for tasks assessed/performed Overall Cognitive Status: Within Functional Limits for tasks assessed Area of Impairment: Problem solving                             Problem Solving: Requires verbal cues;Slow processing General Comments: Pt following commands today         Exercises Exercises: Other exercises Other Exercises Other Exercises: shoulder through digits with gait belt for AAROM shoulder ROM FF and abduction; elbow  flex/ext with waterbottle.   Shoulder Instructions       General Comments      Pertinent Vitals/ Pain       Pain Assessment: Faces Faces Pain Scale: Hurts little more Pain Location: stomach Pain Descriptors / Indicators: Discomfort Pain Intervention(s): Limited activity within patient's tolerance;Monitored during session  Home Living                                          Prior Functioning/Environment              Frequency  Min 2X/week        Progress Toward Goals  OT Goals(current goals can  now be found in the care plan section)     Acute Rehab OT Goals Patient Stated Goal: none stated OT Goal Formulation: With patient Time For Goal Achievement: 08/03/18 Potential to Achieve Goals: Fair ADL Goals Pt Will Perform Eating: Independently;sitting Pt Will Perform Grooming: with min guard assist;standing Pt Will Perform Upper Body Dressing: with set-up;with supervision;sitting Pt Will Perform Lower Body Dressing: with min assist;sit to/from stand Pt Will Transfer to Toilet: with min guard assist;ambulating;bedside commode Pt Will Perform Toileting - Clothing Manipulation and hygiene: with min assist;sit to/from stand Pt Will Perform Tub/Shower Transfer: Tub transfer;3 in 1;ambulating;with modified independence;rolling walker Pt/caregiver will Perform Home Exercise Program: Left upper extremity;With minimal assist  Plan Discharge plan remains appropriate    Co-evaluation                 AM-PAC OT "6 Clicks" Daily Activity     Outcome Measure   Help from another person eating meals?: None Help from another person taking care of personal grooming?: A Little Help from another person toileting, which includes using toliet, bedpan, or urinal?: A Little Help from another person bathing (including washing, rinsing, drying)?: A Little Help from another person to put on and taking off regular upper body clothing?: A Little Help from another person to put on and taking off regular lower body clothing?: A Little 6 Click Score: 19    End of Session Equipment Utilized During Treatment: Gait belt;Rolling walker  OT Visit Diagnosis: Other abnormalities of gait and mobility (R26.89);Muscle weakness (generalized) (M62.81);Other symptoms and signs involving cognitive function;History of falling (Z91.81);Unsteadiness on feet (R26.81)   Activity Tolerance Patient tolerated treatment well   Patient Left in chair;with call bell/phone within reach;with chair alarm set   Nurse  Communication Mobility status        Time: 6789-3810 OT Time Calculation (min): 28 min  Charges: OT General Charges $OT Visit: 1 Visit OT Treatments $Neuromuscular Re-education: 8-22 mins $Therapeutic Exercise: 8-22 mins  Darryl Nestle) Marsa Aris OTR/L Acute Rehabilitation Services Pager: [phone] Office: [place] 07/24/2018, 3:02 PM

## 2018-07-24 NOTE — Progress Notes (Signed)
PT Cancellation Note  Patient Details Name: Brandon Landry MRN: [id number] DOB: December 16, 1965   Cancelled Treatment:    Reason Eval/Treat Not Completed: Patient declined, no reason specified Attempted to see pt for PT this am. Pt opened eyes initially and states "i'm sick. GO AWAY!". Pt would not speak to therapist or open eyes again. PT will continue to follow acutely.    [place] April, PTA Acute Rehabilitation Services Pager: [phone] Office: [phone]   07/24/2018, 10:43 AM

## 2018-07-24 NOTE — Progress Notes (Signed)
Nutrition Follow-up  RD working remotely.  DOCUMENTATION CODES:   Underweight, Severe malnutrition in context of chronic illness  INTERVENTION:   -Continue 30 ml Prostat BID, each supplement provides 100 kcals and 15 grams protein -Continue Magic Cup TID with meals, each supplement provides 290 kcals and 9 grams protein -TPN management per pharmacy -Follow up for ability to transition to enteral feedings  NUTRITION DIAGNOSIS:   Severe Malnutrition related to chronic illness(colonic mass) as evidenced by energy intake < 75% for > or equal to 1 month, moderate fat depletion, severe fat depletion, moderate muscle depletion, severe muscle depletion.  Ongoing  GOAL:   Patient will meet greater than or equal to 90% of their needs  Progressing  MONITOR:   PO intake, Supplement acceptance, Labs, Weight trends, Skin, I & O's  REASON FOR ASSESSMENT:   Consult New TPN/TNA  ASSESSMENT:   Brandon Landry is a 53 y.o. male with medical history significant of depression, iron deficiency anemia, tobacco abuse, who presents with generalized weakness.  5/18- s/p BSE-advanced to regular diet with thin liquids 5/19- PICC placed, TPN initiated  Reviewed I/O's: +233 ml x 24 hours and +2.3 L since admission  UOP: 200 ml x 24 hours  Per meal completion records, PO intake remains variable (PO: 10-75%). Pt accepting one dose daily of Prostat supplements on average.   Pt remains on TPN- currently infusing at 60 ml/hr at 1800, which will provide 1493 kcals and 88 gram of protein, meeting 81% of estimated kcal needs and 84% of estimated protein needs. Per pharmacy notes, plan to increase TPN to 75 mL/hr at 1800. Regimen will provides 1867 kcals and 110 grams of protein, meeting 100% of estimated kcal and protein needs.   RD attempted to obtain nutrition history and discuss possible feeding tube placement with pt on 07/22/18/20, however, pt was very tangential and appeared very overwhelmed with  current health status. Please review noted dated 07/22/18 for further details. While nutritional support via enteral route would be most ideal for pt (due to functioning GI tract), unsure if pt would agree to a temporary feeding tube (such as cortrak) at this time. Additionally, if pt is to go home or to SNF in the immediate future, a short term feeding tube may complicate discharge disposition, unless permanent enteral access is established (also unsure of likelihood of placing permanent access such as a PEG due to pt's medically fragile state). Due to severity pf pt's malnutrition, recommend continue TPN if pt not amenable to short term feeding tube.   Palliative care following closely for goals of care discussions. Per palliative care note dated 07/23/18, pt continues to have poor insight into his medical condition. Pt family working on transporting pt back to [place] to receive medical treatment, however, palliative care team cannot sign off on medical fitness to fly at this point but will continue to monitor.   Labs reviewed: Na: 133, K and Mg WDL, Phos: 2.4 (increased in TPN), CBGS: 111-131 (inpatient orders for glycemic control are 0-9 units insulin aspart every 6 hours).   Diet Order:   Diet Order            Diet regular Room service appropriate? Yes with Assist; Fluid consistency: Thin  Diet effective now              EDUCATION NEEDS:   Education needs have been addressed  Skin:  Skin Assessment: Reviewed RN Assessment  Last BM:  07/24/18  Height:   Ht Readings from Last  1 Encounters:  07/06/18 5' 8.11" (1.73 m)    Weight:   Wt Readings from Last 1 Encounters:  07/23/18 52.6 kg    Ideal Body Weight:  70 kg  BMI:  Body mass index is 17.58 kg/m.  Estimated Nutritional Needs:   Kcal:  1850-2050  Protein:  105-120 grams  Fluid:  > 1.8 L    Ludwig Tugwell A. Jimmye Norman, RD, LDN, [place] Registered Dietitian II Certified Diabetes Care and Education Specialist Pager:  [phone] After hours Pager: [phone]

## 2018-07-24 NOTE — TOC Initial Note (Signed)
Transition of Care [facility]) - Initial/Assessment Note    Patient Details  Name: Nazaire Cordial MRN: [id number] Date of Birth: Aug 24, 1965  Transition of Care [facility]) CM/SW Contact:    Benard Halsted, LCSW Phone Number: 07/24/2018, 12:18 PM  Clinical Narrative:                 CSW received call from Clide Dales (representative from [employer] [facility] [place] [phone]). He stated that they are wanting to send a jet to [place] to pick patient up on Tuesday and are requesting vitals. CSW explained that at this point the medical team does not feel the patient is medically safe to travel and will reassess next week. He stated that their physician has reviewed the records and believes the patient is stable for transport and they want to go ahead and get him before his condition worsens. CSW provided him with CSW email address to be added to the conversation and alerted him that the team will reassess.    Expected Discharge Plan: [place] Barriers to Discharge: Continued Medical Work up   Patient Goals and CMS Choice Patient states their goals for this hospitalization and ongoing recovery are:: Go to [place] with family CMS Medicare.gov Compare Post Acute Care list provided to:: Patient    Expected Discharge Plan and Services Expected Discharge Plan: [place] In-house Referral: Clinical Social Work, Hospice / [place] arrangements for the past 2 months: Apartment                 DME Arranged: N/A DME Agency: NA       HH Arranged: NA HH Agency: NA        Prior Living Arrangements/Services Living arrangements for the past 2 months: Apartment Lives with:: Self Patient language and need for interpreter reviewed:: Yes(Korean) Do you feel safe going back to the place where you live?: No      Need for Family Participation in Patient Care: No (Comment) Care giver support system in place?: No (comment) Current home services: DME, Homehealth  aide, Home PT Criminal Activity/Legal Involvement Pertinent to Current Situation/Hospitalization: No - Comment as needed  Activities of Daily Living      Permission Sought/Granted Permission sought to share information with : Facility Sport and exercise psychologist, Family Supports Permission granted to share information with : Yes, Verbal Permission Granted     Permission granted to share info w AGENCY: Clide Dales, a representative from [employer] [facility] [place] granted to share info w Contact Information: [phone]  Emotional Assessment Appearance:: Appears stated age Attitude/Demeanor/Rapport: Engaged Affect (typically observed): Appropriate, Accepting Orientation: : Oriented to Self, Oriented to Place, Oriented to  Time, Oriented to Situation Alcohol / Substance Use: Not Applicable Psych Involvement: No (comment)  Admission diagnosis:  COLON MASS  POSSIBLE CANCER ANEMIA  Patient Active Problem List   Diagnosis Date Noted  . Cerebral embolism with cerebral infarction 07/21/2018  . Left arm weakness 07/19/2018  . Left arm swelling   . Palliative care encounter   . Non-English speaking patient [name]) 07/08/2018  . 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 Iron deficiency anemia due to chronic blood loss 07/06/2018  . Symptomatic anemia 07/06/2018  . Protein-calorie malnutrition, severe ([place]) 07/06/2018  . Chronic systolic CHF (congestive heart failure) ([place]) 07/06/2018  . Lower extremity edema 07/06/2018  . Leukocytosis 07/06/2018  . Abdominal pain 07/06/2018  . Psychosocial  stressors 07/06/2018  . Tobacco use disorder 07/06/2018  . Hypokalemia 07/06/2018  . Hypomagnesemia 07/06/2018   PCP:  Patient, No Pcp Per Pharmacy:   [facility] [place], [place] [address] [place] [address] Phone: [phone] Fax: [phone]     Social Determinants of  Health ([place]) Interventions    Readmission Risk Interventions Readmission Risk Prevention Plan 07/07/2018  Transportation Screening Complete

## 2018-07-25 ENCOUNTER — Inpatient Hospital Stay (HOSPITAL_COMMUNITY): Payer: Medicaid Other

## 2018-07-25 DIAGNOSIS — R1010 Upper abdominal pain, unspecified: Secondary | ICD-10-CM

## 2018-07-25 LAB — BASIC METABOLIC PANEL
Anion gap: 4 — ABNORMAL LOW (ref 5–15)
BUN: 15 mg/dL (ref 6–20)
CO2: 24 mmol/L (ref 22–32)
Calcium: 7.2 mg/dL — ABNORMAL LOW (ref 8.9–10.3)
Chloride: 104 mmol/L (ref 98–111)
Creatinine, Ser: 0.32 mg/dL — ABNORMAL LOW (ref 0.61–1.24)
GFR calc Af Amer: >60 mL/min (ref >60)
GFR calc non Af Amer: >60 mL/min (ref >60)
Glucose, Bld: 106 mg/dL — ABNORMAL HIGH (ref 70–99)
Potassium: 4 mmol/L (ref 3.5–5.1)
Sodium: 132 mmol/L — ABNORMAL LOW (ref 135–145)

## 2018-07-25 LAB — GLUCOSE, CAPILLARY
Glucose-Capillary: 102 mg/dL — ABNORMAL HIGH (ref 70–99)
Glucose-Capillary: 108 mg/dL — ABNORMAL HIGH (ref 70–99)
Glucose-Capillary: 110 mg/dL — ABNORMAL HIGH (ref 70–99)
Glucose-Capillary: 110 mg/dL — ABNORMAL HIGH (ref 70–99)
Glucose-Capillary: 111 mg/dL — ABNORMAL HIGH (ref 70–99)
Glucose-Capillary: 114 mg/dL — ABNORMAL HIGH (ref 70–99)
Glucose-Capillary: 99 mg/dL (ref 70–99)

## 2018-07-25 MED ORDER — TRAVASOL 10 % IV SOLN
INTRAVENOUS | Status: AC
  Administered 2018-07-25: 18:00:00 via INTRAVENOUS
  Filled 2018-07-25: qty 1098

## 2018-07-25 NOTE — Progress Notes (Signed)
[facility]                                                                              Patient Demographics  Brandon Landry, is a 53 y.o. male, DOB - 06-05-1965, JTT:[id number]  Admit date - 07/19/2018   Admitting Physician Ivor Costa, MD  Outpatient Primary MD for the patient is Patient, No Pcp Per  Outpatient specialists:   LOS - 6  days   Medical records reviewed and are as summarized below:    No chief complaint on file.      Brief summary  Per HPI: Patient is a39 y.o.malewho was hospitalized from 5/4 to 5/6 after he was found to have severe microcytic anemia with a hemoglobin of 3.1, he was transfused PRBC, CT scan of the abdomen on 5/4 showed a large fungating mass in the ascending colon with contained perforation and adjacent abscess formation, general surgery was subsequently consulted-surgical intervention was contemplated-however patient refused and was subsequently discharged home on 5/6. He returned [facility] on 5/16 with generalized weakness, and was found to have a hemoglobin of 3.2. He was transfused 2 units of PRBC, and transferred to [facility]. He now wants to pursue surgical options. General surgery was consulted, cannot undergo surgery due to severe malnutrition, patient refuses to eat American food.  Palliative care was consulted, currently working with patient and family regarding goals of care.  PICC line has been placed and patient has been started on TPN for nourishment. Patient now wishes to go to [place] for all of his medical care, being investigated by palliative medicine. 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  Initially, there appeared to be a thrombus in the ascending aorta versus dissection.  He was offered CT chest with angiogram on 5/18 and refused, but was agreeable on 5/19.  Assessment & Plan    Principal Problem: Severe microcytic  symptomatic anemia -Presented with hemoglobin of 3.2, secondary to chronic blood loss, acute illness, colon mass -Patient has received 2 units packed RBC transfusion at [facility], continue supportive care -Transfused 1 unit packed RBC on 5/20, H&H stable   Active problems Large fungating mass in the ascending colon with probable perforation and adjacent abscess -Currently no abdominal pain or fevers -Repeat CT showed large fungating mass in the right hemiabdomen centered around the ascending colon, invasion of the second and third portions of the duodenum, ascites -General surgery has signed off, patient has not been cooperative and would like to pursue further management in [place] -Complaining of abdominal pain and fullness this morning, obtained stat abdominal x-ray, no perforation or bowel obstruction.  Per RN, had a large BM this morning. -Changed to clear liquid diet today, will continue TNA for now, continue IV Zosyn   Left arm weakness, bilateral strokes -MRI brain showed numerous punctate foci of acute/early subacute infarctions in the right greater than left posterior cerebral hemispheres, right cerebellar hemispheres, no enhancing metastasis. -CT chest showed small bilateral pleural effusions with mural filling defect along the left right lateral wall of ascending thoracic aorta concerning for  thrombus -CTA aorta showed several areas of mural thickening and intimal irregularity noted in the ascending aorta favoring infectious vegetation/thrombus -2D echo showed EF of 40 to 45% with low flow state LV recommend rule out thrombus -Neurology following, not on antithrombotic agents given severe anemia.  Not a candidate for anticoagulation unless anemia and bleeding source is secured.  However patient has refused surgery for the last fungating colon mass.  Ascending aorta vegetation/thrombus -Neurology following, CTA aorta showed several areas of mural thickening and intimal  irregularity in the ascending aorta -Likely related to advanced metastatic colon cancer, not a candidate for anticoagulation due to severe anemia and refusal for the surgery or further management -Recommended palliative and hospice at this time  Left arm swelling -Doppler studies negative for DVT  Severe protein calorie malnutrition  -Appreciate dietitian consultation, continue TPN with PICC line -I had discussed with the patient regarding improving his nutrition prior to the transfer and further management, patient has now started eating.  This morning ate more than 50% breakfast. -continue TNA  Goals of care -Palliative medicine following, overall poor prognosis -Unfortunately no local support due to recent divorce and immediate family in [place] and [place].  Patient will either need hospice or SNF/LTAC while awaiting disposition to [place]  Discussed with social work and palliative medicine, patient is also awaiting transport to [place].  - Palliative medicine will follow-up on Tuesday, if patient is fit for travel.  -Representative from [place] has been following, patient will be transported with the medical jet to [place] when patient is deemed fit for travel.  Code Status: Full code DVT Prophylaxis:  SCD's Family Communication: Discussed in detail with the patient, all imaging results, lab results explained to the patient.  Palliative medicine has been in contact with his family in [place] and [place].    Disposition Plan: Awaiting disposition to [place].  May need skilled nursing facility/LTAC if medical travel to [place] is not feasible  Time Spent in minutes 25 minutes  Procedures:   CT abdomen  Consultants:   General surgery Palliative medicine Neurology  Antimicrobials:   Anti-infectives (From admission, onward)   Start     Dose/Rate Route Frequency Ordered Stop   07/19/18 1400  piperacillin-tazobactam (ZOSYN) IVPB 3.375 g     3.375 g 12.5 mL/hr over 240  Minutes Intravenous Every 8 hours 07/19/18 0651     07/19/18 0600  piperacillin-tazobactam (ZOSYN) IVPB 3.375 g  Status:  Discontinued     3.375 g 12.5 mL/hr over 240 Minutes Intravenous Every 8 hours 07/19/18 0442 07/19/18 0447   07/19/18 0500  piperacillin-tazobactam (ZOSYN) IVPB 3.375 g     3.375 g 100 mL/hr over 30 Minutes Intravenous  Once 07/19/18 0447 07/19/18 1445         Medications  Scheduled Meds:  sodium chloride   Intravenous Once   sodium chloride   Intravenous Once   sodium chloride   Intravenous Once   feeding supplement (PRO-STAT SUGAR FREE 64)  30 mL Oral BID   insulin aspart  0-9 Units Subcutaneous Q6H   nicotine  21 mg Transdermal Daily   thiamine  100 mg Oral Daily   Continuous Infusions:  sodium chloride 50 mL/hr at 07/22/18 0450   piperacillin-tazobactam (ZOSYN)  IV 3.375 g (07/25/18 0609)   TPN ADULT (ION) 75 mL/hr at 07/24/18 2350   TPN ADULT (ION)     PRN Meds:.sodium chloride, acetaminophen **OR** acetaminophen, ondansetron **OR** ondansetron (ZOFRAN) IV, sodium chloride flush  Subjective:   Brandon Landry was seen and examined today.  Complaining of abdominal pain, fullness, states TPN is making him full.  Had a large BM this morning.  No fevers or chills.  Did eat breakfast this morning   Objective:   Vitals:   07/24/18 2035 07/24/18 2306 07/25/18 0500 07/25/18 0718  BP: (!) 145/93   111/76  Pulse: 90   84  Resp: 18     Temp: 98 F (36.7 C)   98.4 F (36.9 C)  TempSrc: Oral   Oral  SpO2: 100%   100%  Weight:   54.5 kg   Height:  5' 8.11" (1.73 m)      Intake/Output Summary (Last 24 hours) at 07/25/2018 1354 Last data filed at 07/25/2018 1478 Gross per 24 hour  Intake 1901.66 ml  Output 1250 ml  Net 651.66 ml     Wt Readings from Last 3 Encounters:  07/25/18 54.5 kg  07/06/18 50.1 kg   Physical Exam  General: Alert and oriented x 3, cachectic, uncomfortable  Eyes:   HEENT:    Cardiovascular: S1 S2 clear,  RRR. No pedal edema b/l  Respiratory: CTAB, no wheezing, rales or rhonchi  Gastrointestinal: Soft, hypoactive bowel sounds, minimal tenderness  Ext: no pedal edema bilaterally  Neuro: no new deficits  Musculoskeletal: No cyanosis, clubbing  Skin: No rashes  Psych: Normal affect and demeanor, alert and oriented x3         Data Reviewed:  I have personally reviewed following labs and imaging studies  Micro Results Recent Results (from the past 240 hour(s))  Culture, blood (Routine X 2) w Reflex to ID Panel     Status: None   Collection Time: 07/19/18  5:34 AM  Result Value Ref Range Status   Specimen Description BLOOD RIGHT ARM  Final   Special Requests   Final    BOTTLES DRAWN AEROBIC ONLY Blood Culture adequate volume   Culture   Final    NO GROWTH 5 DAYS Performed at [facility], [address], [place], [place] [address]    Report Status 07/24/2018 FINAL  Final  Culture, blood (Routine X 2) w Reflex to ID Panel     Status: None   Collection Time: 07/19/18  5:34 AM  Result Value Ref Range Status   Specimen Description BLOOD LEFT ANTECUBITAL  Final   Special Requests   Final    BOTTLES DRAWN AEROBIC ONLY Blood Culture results may not be optimal due to an inadequate volume of blood received in culture bottles   Culture   Final    NO GROWTH 5 DAYS Performed at [facility], [place] [address], [place], [place] [address]    Report Status 07/24/2018 FINAL  Final    Radiology Reports Ct Chest W Contrast  Result Date: 07/20/2018 CLINICAL DATA:  Colon cancer. EXAM: CT CHEST, ABDOMEN, AND PELVIS WITH CONTRAST TECHNIQUE: Multidetector CT imaging of the chest, abdomen and pelvis was performed following the standard protocol during bolus administration of intravenous contrast. CONTRAST:  182mL OMNIPAQUE IOHEXOL 300 MG/ML  SOLN COMPARISON:  07/06/2018 FINDINGS: CT CHEST FINDINGS Cardiovascular: The heart size is normal. Calcification in the LAD coronary artery. There  is a 2.5 cm low-attenuation filling defect arising from the right lateral wall of the ascending thoracic aorta concerning for thrombus. Mediastinum/Nodes: Normal appearance of the thyroid gland. The trachea appears patent and is midline. Normal appearance of the esophagus. No mediastinal or hilar adenopathy. No axillary or supraclavicular  adenopathy. Lungs/Pleura: Small bilateral pleural effusions, similar. Moderate changes of centrilobular and paraseptal emphysema. Scar like density identified in the right apex. No suspicious pulmonary nodule or mass identified. Musculoskeletal: No aggressive lytic or sclerotic bone lesions. CT ABDOMEN PELVIS FINDINGS Hepatobiliary: Low-density structure in lateral segment of left lobe of liver is unchanged measuring 9 mm, image 63/3. No new liver abnormality identified. Pancreas: Unremarkable. No pancreatic ductal dilatation or surrounding inflammatory changes. Spleen: Normal in size. Small peripheral wedge-shaped areas of low attenuation concerning for splenic infarcts. Adrenals/Urinary Tract: Normal appearance of the adrenal glands. There are several small right kidney lesions, too small to reliably characterize. No hydronephrosis bilaterally. Urinary bladder is negative. Stomach/Bowel: The stomach is nondistended. The proximal small bowel loops have a normal caliber. Abnormal bowel wall edema involving the terminal ileum is identified, similar to previous exam. Large necrotic mass within the right abdomen centered around the ascending colon is again noted measuring 10.2 by 8.6 by 10.1 cm (volume = 460 cm^3). Previously this measured 9.9 x 9.9 by 9.6 cm (volume = 490 cm^3). As noted previously tumor appears to involve the second and third portions of the duodenum. Distal colon unremarkable. Vascular/Lymphatic: Aortic atherosclerosis without aneurysm. No abdominopelvic adenopathy. Reproductive: Prostate is unremarkable. Other: Diffuse abdominopelvic ascites is identified.  Musculoskeletal: No acute or significant osseous findings. IMPRESSION: 1. Similar size of large fungating mass within the right hemiabdomen centered around the ascending colon. Invasion of the second and third portions of the duodenum noted. 2. Ascites. 3. Small bilateral pleural effusions. Mural filling defect along the right lateral wall of the ascending thoracic aorta is concerning for thrombus, image 44/6. 4. Small splenic infarcts. Electronically Signed   By: Kerby Moors M.D.   On: 07/20/2018 09:12   Mr Jeri Cos YJ Contrast  Result Date: 07/20/2018 CLINICAL DATA:  53 y/o M; left arm weakness. Possible colon cancer. EXAM: MRI HEAD WITHOUT AND WITH CONTRAST TECHNIQUE: Multiplanar, multiecho pulse sequences of the brain and surrounding structures were obtained without and with intravenous contrast. CONTRAST:  5 cc Gadavist. COMPARISON:  None. FINDINGS: Brain: Numerous punctate foci of reduced diffusion are present throughout the right greater than left posterior frontal lobes, bilateral parietal lobes, right posterior temporal lobe, bilateral occipital lobes, and the right cerebellar hemisphere. Foci of reduced diffusion demonstrate T2 FLAIR hyperintensity. No hemorrhage. After administration of intravenous contrast there is no abnormal enhancement. No extra-axial collection, hydrocephalus, mass effect, or herniation. Vascular: Normal flow voids. Skull and upper cervical spine: Normal marrow signal. Sinuses/Orbits: Negative. Other: None. IMPRESSION: 1. Numerous punctate foci of acute/early subacute infarction are present throughout the right greater than left posterior cerebral hemispheres and the right cerebellar hemisphere. Multiple vascular territories suggests embolic etiology. No hemorrhage or mass effect. 2. No enhancing intracranial metastasis identified. These results will be called to the ordering clinician or representative by the Radiologist Assistant, and communication documented in the PACS or  zVision Dashboard. Electronically Signed   By: Kristine Garbe M.D.   On: 07/20/2018 01:18   Ct Abdomen Pelvis W Contrast  Result Date: 07/20/2018 CLINICAL DATA:  Colon cancer. EXAM: CT CHEST, ABDOMEN, AND PELVIS WITH CONTRAST TECHNIQUE: Multidetector CT imaging of the chest, abdomen and pelvis was performed following the standard protocol during bolus administration of intravenous contrast. CONTRAST:  174mL OMNIPAQUE IOHEXOL 300 MG/ML  SOLN COMPARISON:  07/06/2018 FINDINGS: CT CHEST FINDINGS Cardiovascular: The heart size is normal. Calcification in the LAD coronary artery. There is a 2.5 cm low-attenuation filling defect arising from the right  lateral wall of the ascending thoracic aorta concerning for thrombus. Mediastinum/Nodes: Normal appearance of the thyroid gland. The trachea appears patent and is midline. Normal appearance of the esophagus. No mediastinal or hilar adenopathy. No axillary or supraclavicular adenopathy. Lungs/Pleura: Small bilateral pleural effusions, similar. Moderate changes of centrilobular and paraseptal emphysema. Scar like density identified in the right apex. No suspicious pulmonary nodule or mass identified. Musculoskeletal: No aggressive lytic or sclerotic bone lesions. CT ABDOMEN PELVIS FINDINGS Hepatobiliary: Low-density structure in lateral segment of left lobe of liver is unchanged measuring 9 mm, image 63/3. No new liver abnormality identified. Pancreas: Unremarkable. No pancreatic ductal dilatation or surrounding inflammatory changes. Spleen: Normal in size. Small peripheral wedge-shaped areas of low attenuation concerning for splenic infarcts. Adrenals/Urinary Tract: Normal appearance of the adrenal glands. There are several small right kidney lesions, too small to reliably characterize. No hydronephrosis bilaterally. Urinary bladder is negative. Stomach/Bowel: The stomach is nondistended. The proximal small bowel loops have a normal caliber. Abnormal bowel wall  edema involving the terminal ileum is identified, similar to previous exam. Large necrotic mass within the right abdomen centered around the ascending colon is again noted measuring 10.2 by 8.6 by 10.1 cm (volume = 460 cm^3). Previously this measured 9.9 x 9.9 by 9.6 cm (volume = 490 cm^3). As noted previously tumor appears to involve the second and third portions of the duodenum. Distal colon unremarkable. Vascular/Lymphatic: Aortic atherosclerosis without aneurysm. No abdominopelvic adenopathy. Reproductive: Prostate is unremarkable. Other: Diffuse abdominopelvic ascites is identified. Musculoskeletal: No acute or significant osseous findings. IMPRESSION: 1. Similar size of large fungating mass within the right hemiabdomen centered around the ascending colon. Invasion of the second and third portions of the duodenum noted. 2. Ascites. 3. Small bilateral pleural effusions. Mural filling defect along the right lateral wall of the ascending thoracic aorta is concerning for thrombus, image 44/6. 4. Small splenic infarcts. Electronically Signed   By: Kerby Moors M.D.   On: 07/20/2018 09:12   Ct Abdomen Pelvis W Contrast  Result Date: 07/06/2018 CLINICAL DATA:  Left lower quadrant pain EXAM: CT ABDOMEN AND PELVIS WITH CONTRAST TECHNIQUE: Multidetector CT imaging of the abdomen and pelvis was performed using the standard protocol following bolus administration of intravenous contrast. CONTRAST:  116mL OMNIPAQUE IOHEXOL 300 MG/ML  SOLN COMPARISON:  None. FINDINGS: Lower chest: Small bilateral pleural effusions are noted right greater than left with mild bibasilar atelectatic changes. Hepatobiliary: Liver is well visualized and within normal limits. The gallbladder is well distended without cholelithiasis. Pancreas: Unremarkable. No pancreatic ductal dilatation or surrounding inflammatory changes. Spleen: Normal in size without focal abnormality. Adrenals/Urinary Tract: Adrenal glands are within normal limits. The  kidneys demonstrate a normal enhancement pattern and normal excretion bilaterally. A small cortical cyst is noted within the right kidney measuring approximately 1 cm. The bladder is well distended. Stomach/Bowel: The distal colon appears within normal limits. In the ascending colon there is a large colonic mass with changes highly suggestive of contained perforation. This measures approximately 9.9 x 9.9 cm in greatest AP and transverse dimensions. This extends along the margin of the second portion of the duodenum and duodenal wall thickening is noted suggestive of localized invasion. Mottled air fluid collection is noted adjacent to the column contrast which likely represents contained perforation. Free fluid is noted within the pelvis consistent with the perforation. The appendix is within normal limits. The jejunum and ileum are within normal limits with the exception of some wall thickening in the distal ileum related to the  localized inflammatory change from the colonic mass. Vascular/Lymphatic: Aortic atherosclerosis. No enlarged abdominal or pelvic lymph nodes. Reproductive: Prostate is unremarkable. Other: Free fluid is noted within the pelvis as previously described. This also extends higher in the abdomen along the spleen and liver consistent with the findings in the ascending colon consistent with contained perforation. Musculoskeletal: No acute bony abnormality is seen. IMPRESSION: Large fungating mass in the ascending colon with changes highly suggestive of contained perforation and adjacent abscess formation. This extends along the margin of the second portion of the duodenum in the possibility of localized invasion of the mass deserves consideration. Surgical consultation is recommended. Free fluid within the abdomen consistent with the known perforated mass. No definitive extravasation of contrast material is noted at this time consistent with a more contained perforation. Bilateral pleural  effusions with mild bibasilar atelectasis. Electronically Signed   By: Inez Catalina M.D.   On: 07/06/2018 17:55   Dg Abd Portable 1v  Result Date: 07/25/2018 CLINICAL DATA:  53 year old male with history of abdominal pain and malnutrition. EXAM: PORTABLE ABDOMEN - 1 VIEW COMPARISON:  No priors. FINDINGS: Gas and stool are seen scattered throughout the colon extending to the level of the distal rectum. No pathologic distension of small bowel is noted. No gross evidence of pneumoperitoneum. IMPRESSION: 1. Nonobstructive bowel gas pattern. 2. No pneumoperitoneum. Electronically Signed   By: Vinnie Langton M.D.   On: 07/25/2018 09:15   Ct Angio Chest Aorta W/cm &/or Wo/cm  Result Date: 07/21/2018 CLINICAL DATA:  53 year old male with history of embolic strokes. Possible thrombus in the ascending thoracic aorta noted on prior non gated chest CT. Follow-up study. EXAM: CT ANGIOGRAPHY CHEST WITH CONTRAST TECHNIQUE: Multidetector CT imaging of the chest was performed using the standard protocol as well as cardiac gating during bolus administration of intravenous contrast. Multiplanar CT image reconstructions and MIPs were obtained to evaluate the vascular anatomy. CONTRAST:  173mL OMNIPAQUE IOHEXOL 350 MG/ML SOLN COMPARISON:  Chest CT 05/18/20209. FINDINGS: Cardiovascular: Heart size is enlarged with mild left ventricular dilatation. There is also some mild myocardial thinning in the apex of the left ventricle which appears rounded, suggesting sequela of prior distal LAD territory myocardial infarction. There is no significant pericardial fluid, thickening or pericardial calcification. Aortic atherosclerosis, as well as calcified atherosclerotic plaque in the left anterior descending coronary artery. Several areas of mural thickening and intimal irregularity are noted in the ascending thoracic aorta. The first of these is at and immediately above the level of the sino-tubular junction best appreciated on axial  image 100 of series 14, where this focal mural thickening of the posterolateral wall of the ascending thoracic aorta on the right side extends into the lumen over a distance of approximately 11 mm craniocaudally (coronal image 31 of series 18). The second intimal irregularity is best appreciated on axial image 60 of series 14 also in the proximal ascending thoracic aorta slightly cranial to the previously described lesion, without much extension into the lumen. The final lesion is much larger, best appreciated on axial image 24 of series 14 and coronal image 63 of series 10 where a focal area of mural thickening along the right lateral wall of the ascending thoracic aorta measures up to approximately 10 x 8 mm, and a pedunculated portion of the lesion extends into the lumen cephalad over distance of approximately 3.3 cm toward the proximal aortic arch. Mediastinum/Nodes: No pathologically enlarged mediastinal or hilar lymph nodes. Esophagus is unremarkable in appearance. No axillary lymphadenopathy. Lungs/Pleura:  Small bilateral pleural effusions lying dependently with some associated passive subsegmental atelectasis in the lower lobes of the lungs bilaterally. No acute consolidative airspace disease. Diffuse bronchial wall thickening with moderate centrilobular and paraseptal emphysema. No definite suspicious appearing pulmonary nodules or masses are noted. Upper Abdomen: Please refer to dedicated CT the abdomen and pelvis from yesterday for full description of findings beneath the diaphragm. Musculoskeletal: There are no aggressive appearing lytic or blastic lesions noted in the visualized portions of the skeleton. Review of the MIP images confirms the above findings. IMPRESSION: 1. Three focal areas of mural thickening and intimal irregularity in the ascending thoracic aorta, as detailed above. These are highly unusual. Based on the multiplicity of these lesions, and the patient's history of large colonic mass  which shows signs of pending perforation, these are strongly favored to represent infectious vegetations/thrombus. 2. Aortic atherosclerosis, in addition to left anterior descending coronary artery disease. Please note that although the presence of coronary artery calcium documents the presence of coronary artery disease, the severity of this disease and any potential stenosis cannot be assessed on this non-gated CT examination. Assessment for potential risk factor modification, dietary therapy or pharmacologic therapy may be warranted, if clinically indicated. 3. Cardiomegaly with mild left ventricular dilatation. 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 4. Small bilateral pleural effusions lying dependently. 5. Diffuse bronchial wall thickening with moderate centrilobular and paraseptal emphysema; imaging findings suggestive of underlying COPD. Critical Value/emergent results were called by telephone at the time of interpretation on 07/21/2018 at 2:04 pm to [place], who verbally acknowledged these results. Aortic Atherosclerosis (ICD10-I70.0) and Emphysema (ICD10-J43.9). Electronically Signed   By: Vinnie Langton M.D.   On: 07/21/2018 14:05   Vas [place] Upper Extremity Venous Duplex  Result Date: 07/20/2018 UPPER VENOUS STUDY  Indications: Swelling Comparison Study: No prior study on file for comparison Performing Technologist: Sharion Dove RVS  Examination Guidelines: A complete evaluation includes B-mode imaging, spectral Doppler, color Doppler, and power Doppler as needed of all accessible portions of each vessel. Bilateral testing is considered an integral part of a complete examination. Limited examinations for reoccurring indications may be performed as noted.  Right Findings: +----------+------------+---------+-----------+----------+-------+  RIGHT       Compressible Phasicity Spontaneous Properties Summary  +----------+------------+---------+-----------+----------+-------+  Subclavian                 Yes        Yes                         +----------+------------+---------+-----------+----------+-------+  Left Findings: +----------+------------+---------+-----------+----------+-------+  LEFT       Compressible Phasicity Spontaneous Properties Summary  +----------+------------+---------+-----------+----------+-------+  IJV                        Yes        Yes                         +----------+------------+---------+-----------+----------+-------+  Subclavian     Full        Yes        Yes                         +----------+------------+---------+-----------+----------+-------+  Axillary       Full        Yes  Yes                         +----------+------------+---------+-----------+----------+-------+  Brachial       Full        Yes        Yes                         +----------+------------+---------+-----------+----------+-------+  Radial         Full                                               +----------+------------+---------+-----------+----------+-------+  Ulnar          Full                                               +----------+------------+---------+-----------+----------+-------+  Cephalic     Partial                                     Chronic  +----------+------------+---------+-----------+----------+-------+  Basilic        Full                                               +----------+------------+---------+-----------+----------+-------+  Summary:  Right: No evidence of thrombosis in the subclavian.  Left: No evidence of deep vein thrombosis in the upper extremity. 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 Chronic partial thrombus noted in a small portion of the cephalic vein from [facility] to mid upper arm.  *See table(s) above for measurements and observations.  Diagnosing physician:  Harold Barban MD Electronically signed by Harold Barban MD on 07/20/2018 at 9:11:19 AM.    Final    [place] Ekg Site Rite  Result Date: 07/21/2018 If Site Rite image not attached, placement could not be confirmed due to current cardiac rhythm.   Lab Data:  CBC: Recent Labs  Lab 07/19/18 0953 07/20/18 0337 07/21/18 0300 07/22/18 0353 07/22/18 1550 07/23/18 0431  WBC 26.2* 25.0* 21.6* 18.0*  --  16.4*  NEUTROABS 22.7*  --   --  14.9*  --   --   HGB 7.6* 7.6* 7.2* 6.6* 8.9* 8.2*  HCT 23.7* 23.6* 22.0* 21.0* 27.2* 25.6*  MCV 82.6 81.7 81.8 84.3  --  85.6  PLT 341 334 292 287  --  983   Basic Metabolic Panel: Recent Labs  Lab 07/19/18 0534 07/20/18 0337 07/21/18 0300 07/22/18 0353 07/23/18 0431 07/24/18 0359 07/25/18 0352  NA 135 132* 133* 131* 132* 133* 132*  K 3.2* 3.9 3.6 3.7 3.8 3.8 4.0  CL 104 106 104 103 104 102 104  CO2 19* 20* 22 23 23 24 24   GLUCOSE 84 85 97 95 107* 108* 106*  BUN 17 12 9 10 13 13 15   CREATININE 0.50* 0.54* 0.48* 0.40* 0.38* 0.34* 0.32*  CALCIUM 7.4* 7.2* 7.3* 7.1* 7.2* 7.2* 7.2*  MG 2.0 1.9 2.0 1.8 2.1  --   --   PHOS  --  2.6  --  2.8 2.4*  --   --    GFR: Estimated Creatinine Clearance: 82.3 mL/min (A) (by C-G formula based on SCr of 0.32 mg/dL (L)). Liver Function Tests: Recent Labs  Lab 07/19/18 0534 07/20/18 0337 07/22/18 0353 07/23/18 0431  AST 13* 10* 10* 15  ALT 11 9 11 14   ALKPHOS 83 74 70 75  BILITOT 1.7* 1.3* 0.8 0.6  PROT 5.1* 5.0* 4.5* 5.0*  ALBUMIN 1.5* 1.3* 1.2* 1.3*   No results for input(s): LIPASE, AMYLASE in the last 168 hours. No results for input(s): AMMONIA in the last 168 hours. Coagulation Profile: Recent Labs  Lab 07/19/18 0534  INR 1.3*   Cardiac Enzymes: No results for input(s): CKTOTAL, CKMB, CKMBINDEX, TROPONINI in the last 168 hours. BNP (last 3 results) No results for input(s): PROBNP in the last 8760 hours. HbA1C: No results for input(s): HGBA1C in the last 72 hours. CBG: Recent Labs  Lab  07/24/18 1758 07/25/18 0045 07/25/18 0626 07/25/18 0850 07/25/18 1208  GLUCAP 103* 108* 99 114* 110*   Lipid Profile: No results for input(s): CHOL, HDL, LDLCALC, TRIG, CHOLHDL, LDLDIRECT in the last 72 hours. Thyroid Function Tests: No results for input(s): TSH, T4TOTAL, FREET4, T3FREE, THYROIDAB in the last 72 hours. Anemia Panel: No results for input(s): VITAMINB12, FOLATE, FERRITIN, TIBC, IRON, RETICCTPCT in the last 72 hours. Urine analysis: No results found for: COLORURINE, APPEARANCEUR, LABSPEC, PHURINE, GLUCOSEU, HGBUR, BILIRUBINUR, KETONESUR, PROTEINUR, UROBILINOGEN, NITRITE, LEUKOCYTESUR   Tymeir Weathington M.D. [facility] 07/25/2018, 1:54 PM  Pager: [phone] Between 7am to 7pm - call Pager - [phone]  After 7pm go to [email or web address] - password [email or web address]  Call night coverage person covering after 7pm

## 2018-07-25 NOTE — Progress Notes (Signed)
PHARMACY - ADULT TOTAL PARENTERAL NUTRITION CONSULT NOTE   Pharmacy Consult for TPN Indication: Severe malnutrition; R-colon mass extending into duodenum   Patient Measurements: Height: 5' 8.11" (173 cm) Weight: 120 lb 2.4 oz (54.5 kg) IBW/kg (Calculated) : 68.65   Body mass index is 18.21 kg/m.   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 He is very malnourished and refuses to eat (only wants [place]). He is ok with TPN and needs improved nutrition if going to have surgery. Palliative care on board.   GI: Pre-albumin <5. LBM 5/22 Endo: CBGs 96-106 with TPN. Insulin requirements in the past 24 hours: 1 unit Lytes: Na low 132, K 4, Phos 2.4 and Mag 2.1 on 5/21 Renal: SCr 0.32, BUN 15 Pulm: no issues - RA Cards: VSS Hepatobil: LFT/Tbili/TG within normal limits.  Neuro: pain 0-2. High dose Thiamine 500mg  TID ID: WBC 16 (decreasing) on Zosyn. Afebrile. PCT 0.10.  TPN Access: PICC  TPN start date: 07/21/18 Nutritional Goals (per RD recommendation on 5/19): KCal: 1850-2050 Protein: 105-120g Fluid: >1.8L  Goal TPN rate is 75 ml/hr (provides 110g protein, 54g lipids, and 261g dextrose, 1867 kcal meeting 100% of needs)  Current Nutrition:  Regular diet- 50% of 1 meal Ensure Enlive - 0 yesterday  Plan:  Continue TPN at 75 mL/hr.  This TPN provides 110g protein, 54g lipids, and 261g dextrose, 1867 kcal meeting 100% of needs Electrolytes in TPN: increase Na and slightly increase Phos and Mg, others standard for now. WK:MQKMMNO 1:1.  Add MVI to TPN Trace Elements on MWF only due to shortage Continue sensitive SSI q6h and adjust as needed Continue Thiamine 500mg  po TID as oral medication.  Monitor TPN labs Will order BMP, Mag and Phos for am labs tomorrow Follow-up if patient would be amendable to tube placement for enteral feeding as this would be best with functioning GI tract Follow-up plan  of care  Alanda Slim, PharmD, [facility] Clinical Pharmacist Please see AMION for all Pharmacists' Contact Phone Numbers 07/25/2018, 8:09 AM

## 2018-07-25 NOTE — Progress Notes (Signed)
Pharmacy Antibiotic Note  Brandon Landry is a 53 y.o. male admitted on 07/19/2018 with intra-abdominal infection .  Pharmacy has been consulted for zosyn dosing. SCr 0.32, CrCl ~ 63ml/min  Plan: Zosyn 3.375g IV every 8 hours Monitor renal function and LOT  Height: 5' 8.11" (173 cm) Weight: 120 lb 2.4 oz (54.5 kg) IBW/kg (Calculated) : 68.65  Temp (24hrs), Avg:98 F (36.7 C), Min:97.7 F (36.5 C), Max:98.4 F (36.9 C)  Recent Labs  Lab 07/19/18 0953 07/20/18 0337 07/21/18 0300 07/22/18 0353 07/23/18 0431 07/24/18 0359 07/25/18 0352  WBC 26.2* 25.0* 21.6* 18.0* 16.4*  --   --   CREATININE  --  0.54* 0.48* 0.40* 0.38* 0.34* 0.32*    Estimated Creatinine Clearance: 82.3 mL/min (A) (by C-G formula based on SCr of 0.32 mg/dL (L)).    No Known Allergies  Antimicrobials this admission: Zosyn 5/17>>  Dose adjustments this admission: n/a  Microbiology results: 5/17 BCx: ngF  [name], PharmD PGY1 Pharmacy Resident Phone [phone] 07/25/2018       8:36 AM

## 2018-07-26 ENCOUNTER — Inpatient Hospital Stay (HOSPITAL_COMMUNITY): Payer: Medicaid Other

## 2018-07-26 LAB — CBC
HCT: 18.3 % — ABNORMAL LOW (ref 39.0–52.0)
Hemoglobin: 5.7 g/dL — CL (ref 13.0–17.0)
MCH: 27.4 pg (ref 26.0–34.0)
MCHC: 31.1 g/dL (ref 30.0–36.0)
MCV: 88 fL (ref 80.0–100.0)
Platelets: 347 10*3/uL (ref 150–400)
RBC: 2.08 MIL/uL — ABNORMAL LOW (ref 4.22–5.81)
RDW: 19.2 % — ABNORMAL HIGH (ref 11.5–15.5)
WBC: 14.7 10*3/uL — ABNORMAL HIGH (ref 4.0–10.5)
nRBC: 0 % (ref 0.0–0.2)

## 2018-07-26 LAB — GLUCOSE, CAPILLARY: Glucose-Capillary: 105 mg/dL — ABNORMAL HIGH (ref 70–99)

## 2018-07-26 LAB — BASIC METABOLIC PANEL
Anion gap: 6 (ref 5–15)
BUN: 17 mg/dL (ref 6–20)
CO2: 23 mmol/L (ref 22–32)
Calcium: 7.2 mg/dL — ABNORMAL LOW (ref 8.9–10.3)
Chloride: 102 mmol/L (ref 98–111)
Creatinine, Ser: <0.3 mg/dL — ABNORMAL LOW (ref 0.61–1.24)
Glucose, Bld: 101 mg/dL — ABNORMAL HIGH (ref 70–99)
Potassium: 3.9 mmol/L (ref 3.5–5.1)
Sodium: 131 mmol/L — ABNORMAL LOW (ref 135–145)

## 2018-07-26 LAB — PREPARE RBC (CROSSMATCH)

## 2018-07-26 LAB — PHOSPHORUS: Phosphorus: 3 mg/dL (ref 2.5–4.6)

## 2018-07-26 LAB — HEMOGLOBIN AND HEMATOCRIT, BLOOD
HCT: 22.9 % — ABNORMAL LOW (ref 39.0–52.0)
Hemoglobin: 7.4 g/dL — ABNORMAL LOW (ref 13.0–17.0)

## 2018-07-26 LAB — MAGNESIUM: Magnesium: 2 mg/dL (ref 1.7–2.4)

## 2018-07-26 MED ORDER — ONDANSETRON 4 MG PO TBDP: 4.0000 mg | ORAL_TABLET | Freq: Once | ORAL | Status: DC

## 2018-07-26 MED ORDER — SODIUM CHLORIDE 0.9% IV SOLUTION: Freq: Once | INTRAVENOUS | Status: DC

## 2018-07-26 MED ORDER — TRAVASOL 10 % IV SOLN
INTRAVENOUS | Status: AC
  Administered 2018-07-26: 18:00:00 via INTRAVENOUS
  Filled 2018-07-26: qty 1098

## 2018-07-26 MED ORDER — SODIUM CHLORIDE 0.9% IV SOLUTION
Freq: Once | INTRAVENOUS | Status: AC
  Administered 2018-07-26: 13:00:00 via INTRAVENOUS

## 2018-07-26 NOTE — Progress Notes (Signed)
PHARMACY - ADULT TOTAL PARENTERAL NUTRITION CONSULT NOTE   Pharmacy Consult for TPN Indication: Severe malnutrition; R-colon mass extending into duodenum   Patient Measurements: Height: 5' 8.11" (173 cm) Weight: 115 lb 11.9 oz (52.5 kg) IBW/kg (Calculated) : 68.65   Body mass index is 17.54 kg/m.   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 He is very malnourished and refuses to eat (only wants [place]). He is ok with TPN and needs improved nutrition if going to have surgery. Palliative care on board.   GI: Pre-albumin <5. LBM 5/22 Endo: CBGs 99-111 with TPN. Glucose has been in control, will d/c SSI Insulin requirements in the past 24 hours: 0 units Lytes: Na low 131, K 3.9, Phos 3 and Mag 2  Renal: SCr <0.3, BUN 17 Pulm: no issues - RA Cards: VSS Hepatobil: LFT/Tbili/TG within normal limits.  Neuro: pain 0-2. Got high dose Thiamine, now with 100mg  daily ID: WBC 16>14.7 (decreasing) on Zosyn. Afebrile. PCT 0.10.  TPN Access: PICC  TPN start date: 07/21/18 Nutritional Goals (per RD recommendation on 5/19): KCal: 1850-2050 Protein: 105-120g Fluid: >1.8L  Goal TPN rate is 75 ml/hr (provides 110g protein, 54g lipids, and 261g dextrose, 1867 kcal meeting 100% of needs)  Current Nutrition:  Regular diet- 0% Ensure Enlive - 0 yesterday  Plan:  Continue TPN at 75 mL/hr.  This TPN provides 110g protein, 54g lipids, and 261g dextrose, 1867 kcal meeting 100% of needs Electrolytes in TPN: will increase Na more and decrease Phos and continue slightly increased Mg, others standard for now. QM:VHQIONG 1:1.  Add MVI to TPN Trace Elements on MWF only due to shortage D/C sensitive SSI q6h  Continue Thiamine 100mg  po daily as oral medication. Monitor TPN labs Follow-up plan of care  Alanda Slim, PharmD, [facility] Clinical Pharmacist Please see AMION for all Pharmacists' Contact Phone  Numbers 07/26/2018, 7:25 AM

## 2018-07-26 NOTE — Progress Notes (Signed)
CRITICAL VALUE ALERT  Critical Value:  Hbg 5.7  Date & Time Notied:  07/26/18 0601  Provider Notified: Kennon Holter, NP  Orders Received/Actions taken: Transfuse 2 units PRBCs.

## 2018-07-26 NOTE — Progress Notes (Signed)
[facility]                                                                              Patient Demographics  Brandon Landry, is a 53 y.o. male, DOB - Feb 22, 1966, NAT:[id number]  Admit date - 07/19/2018   Admitting Physician Ivor Costa, MD  Outpatient Primary MD for the patient is Patient, No Pcp Per  Outpatient specialists:   LOS - 7  days   Medical records reviewed and are as summarized below:    No chief complaint on file.      Brief summary  Per HPI: Patient is a48 y.o.malewho was hospitalized from 5/4 to 5/6 after he was found to have severe microcytic anemia with a hemoglobin of 3.1, he was transfused PRBC, CT scan of the abdomen on 5/4 showed a large fungating mass in the ascending colon with contained perforation and adjacent abscess formation, general surgery was subsequently consulted-surgical intervention was contemplated-however patient refused and was subsequently discharged home on 5/6. He returned [facility] on 5/16 with generalized weakness, and was found to have a hemoglobin of 3.2. He was transfused 2 units of PRBC, and transferred to [facility]. He now wants to pursue surgical options. General surgery was consulted, cannot undergo surgery due to severe malnutrition, patient refuses to eat American food.  Palliative care was consulted, currently working with patient and family regarding goals of care.  PICC line has been placed and patient has been started on TPN for nourishment. Patient now wishes to go to [place] for all of his medical care, being investigated by palliative medicine. 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  Initially, there appeared to be a thrombus in the ascending aorta versus dissection.  He was offered CT chest with angiogram on 5/18 and refused, but was agreeable on 5/19.  Assessment & Plan    Principal Problem: Severe microcytic  symptomatic anemia -Presented with hemoglobin of 3.2, secondary to chronic blood loss, acute illness, colon mass -Patient has received 2 units packed RBC transfusion at [facility], continue supportive care -Transfused 1 unit packed RBC on 5/20, hemoglobin again down to 5.7 from 8.2 on 5/21 -Transfuse 2 units packed RBCs today  Active problems Large fungating mass in the ascending colon with probable perforation and adjacent abscess -Currently no abdominal pain or fevers -Repeat CT showed large fungating mass in the right hemiabdomen centered around the ascending colon, invasion of the second and third portions of the duodenum, ascites -General surgery has signed off, patient has not been cooperative and would like to pursue further management in [place] -Hemoglobin dropped to 5.7 today with nausea and vomiting, obtain CT abdomen and transfuse 2 units. -Patient miserable with clear liquid diet and demanding to eat solid diet.   Left arm weakness, bilateral strokes -MRI brain showed numerous punctate foci of acute/early subacute infarctions in the right greater than left posterior cerebral hemispheres, right cerebellar hemispheres, no enhancing metastasis. -CT chest showed small bilateral pleural effusions with mural filling defect along the left right lateral wall of ascending thoracic aorta concerning for  thrombus -CTA aorta showed several areas of mural thickening and intimal irregularity noted in the ascending aorta favoring infectious vegetation/thrombus -2D echo showed EF of 40 to 45% with low flow state LV recommend rule out thrombus -Neurology following, not on antithrombotic agents given severe anemia.  Not a candidate for anticoagulation unless anemia and bleeding source is secured.  However patient has refused surgery for the last fungating colon mass.  Ascending aorta vegetation/thrombus -Neurology following, CTA aorta showed several areas of mural thickening and intimal  irregularity in the ascending aorta -Likely related to advanced metastatic colon cancer, not a candidate for anticoagulation due to severe anemia and refusal for the surgery or further management -Recommended palliative and hospice at this time  Left arm swelling -Doppler studies negative for DVT  Severe protein calorie malnutrition  -Appreciate dietitian consultation, continue TPN with PICC line -I had discussed with the patient regarding improving his nutrition prior to the transfer and further management, patient has now started eating.  This morning ate more than 50% breakfast. -continue TNA  Goals of care -Palliative medicine following, overall poor prognosis -Unfortunately no local support due to recent divorce and immediate family in [place] and [place].  Patient will either need hospice or SNF/LTAC while awaiting disposition to [place]  Discussed with social work and palliative medicine, patient is also awaiting transport to [place].  - Palliative medicine will follow-up on Tuesday, if patient is fit for travel.  -Representative from [place] has been following, patient will be transported with the medical jet to [place] when patient is deemed fit for travel.  Code Status: Full code DVT Prophylaxis:  SCD's Family Communication: Discussed in detail with the patient, all imaging results, lab results explained to the patient.  Palliative medicine has been in contact with his family in [place] and [place].    Disposition Plan: Awaiting disposition to [place].  May need skilled nursing facility/LTAC if medical travel to [place] is not feasible  Time Spent in minutes 25 minutes  Procedures:   CT abdomen  Consultants:   General surgery Palliative medicine Neurology  Antimicrobials:   Anti-infectives (From admission, onward)   Start     Dose/Rate Route Frequency Ordered Stop   07/19/18 1400  piperacillin-tazobactam (ZOSYN) IVPB 3.375 g     3.375 g 12.5 mL/hr over 240  Minutes Intravenous Every 8 hours 07/19/18 0651     07/19/18 0600  piperacillin-tazobactam (ZOSYN) IVPB 3.375 g  Status:  Discontinued     3.375 g 12.5 mL/hr over 240 Minutes Intravenous Every 8 hours 07/19/18 0442 07/19/18 0447   07/19/18 0500  piperacillin-tazobactam (ZOSYN) IVPB 3.375 g     3.375 g 100 mL/hr over 30 Minutes Intravenous  Once 07/19/18 0447 07/19/18 1445         Medications  Scheduled Meds:  sodium chloride   Intravenous Once   sodium chloride   Intravenous Once   sodium chloride   Intravenous Once   sodium chloride   Intravenous Once   feeding supplement (PRO-STAT SUGAR FREE 64)  30 mL Oral BID   nicotine  21 mg Transdermal Daily   ondansetron  4 mg Oral Once   thiamine  100 mg Oral Daily   Continuous Infusions:  sodium chloride 50 mL/hr at 07/22/18 0450   piperacillin-tazobactam (ZOSYN)  IV 3.375 g (07/26/18 0854)   TPN ADULT (ION) 75 mL/hr at 07/25/18 1807   TPN ADULT (ION)     PRN Meds:.sodium chloride, acetaminophen **OR** acetaminophen, ondansetron **OR** ondansetron (ZOFRAN)  IV, sodium chloride flush      Subjective:   Brandon Landry was seen and examined today.  Does not like clear liquid food, had nausea and vomiting.  No fevers or chills.  Wants solid food.  Abdominal pain tolerable.   Objective:   Vitals:   07/25/18 2111 07/26/18 0515 07/26/18 1236 07/26/18 1311  BP: 109/78 106/77 106/69 100/80  Pulse: 98 93 (!) 101 95  Resp: 20 20 16 17   Temp: 98.2 F (36.8 C) 98.8 F (37.1 C) 98.4 F (36.9 C) 97.7 F (36.5 C)  TempSrc: Oral Oral Axillary Axillary  SpO2: 100% 100% 100% 100%  Weight:  52.5 kg    Height:        Intake/Output Summary (Last 24 hours) at 07/26/2018 1416 Last data filed at 07/26/2018 1300 Gross per 24 hour  Intake 1409.66 ml  Output 2025 ml  Net -615.34 ml     Wt Readings from Last 3 Encounters:  07/26/18 52.5 kg  07/06/18 50.1 kg   Physical Exam  General: Alert and oriented x 3, NAD, cachectic     eyes: ,  HEENT:  Atraumatic  Cardiovascular: S1 S2 clear, no murmurs, RRR. No pedal edema b/l  Respiratory: CTAB, no wheezing, rales or rhonchi  Gastrointestinal: Soft, minimal diffuse tenderness, hypoactive bowel sounds  Ext: no pedal edema bilaterally  Neuro: no new deficits  Musculoskeletal: No cyanosis, clubbing  Skin: No rashes  Psych: Normal affect and demeanor, alert and oriented x3     data Reviewed:  I have personally reviewed following labs and imaging studies  Micro Results Recent Results (from the past 240 hour(s))  Culture, blood (Routine X 2) w Reflex to ID Panel     Status: None   Collection Time: 07/19/18  5:34 AM  Result Value Ref Range Status   Specimen Description BLOOD RIGHT ARM  Final   Special Requests   Final    BOTTLES DRAWN AEROBIC ONLY Blood Culture adequate volume   Culture   Final    NO GROWTH 5 DAYS Performed at [facility], [address], [place], [place] [address]    Report Status 07/24/2018 FINAL  Final  Culture, blood (Routine X 2) w Reflex to ID Panel     Status: None   Collection Time: 07/19/18  5:34 AM  Result Value Ref Range Status   Specimen Description BLOOD LEFT ANTECUBITAL  Final   Special Requests   Final    BOTTLES DRAWN AEROBIC ONLY Blood Culture results may not be optimal due to an inadequate volume of blood received in culture bottles   Culture   Final    NO GROWTH 5 DAYS Performed at [facility], [place] [address], [place], [place] [address]    Report Status 07/24/2018 FINAL  Final    Radiology Reports Ct Chest W Contrast  Result Date: 07/20/2018 CLINICAL DATA:  Colon cancer. EXAM: CT CHEST, ABDOMEN, AND PELVIS WITH CONTRAST TECHNIQUE: Multidetector CT imaging of the chest, abdomen and pelvis was performed following the standard protocol during bolus administration of intravenous contrast. CONTRAST:  175mL OMNIPAQUE IOHEXOL 300 MG/ML  SOLN COMPARISON:  07/06/2018 FINDINGS: CT CHEST FINDINGS  Cardiovascular: The heart size is normal. Calcification in the LAD coronary artery. There is a 2.5 cm low-attenuation filling defect arising from the right lateral wall of the ascending thoracic aorta concerning for thrombus. Mediastinum/Nodes: Normal appearance of the thyroid gland. The trachea appears patent and is midline. Normal appearance of the esophagus. No mediastinal  or hilar adenopathy. No axillary or supraclavicular adenopathy. Lungs/Pleura: Small bilateral pleural effusions, similar. Moderate changes of centrilobular and paraseptal emphysema. Scar like density identified in the right apex. No suspicious pulmonary nodule or mass identified. Musculoskeletal: No aggressive lytic or sclerotic bone lesions. CT ABDOMEN PELVIS FINDINGS Hepatobiliary: Low-density structure in lateral segment of left lobe of liver is unchanged measuring 9 mm, image 63/3. No new liver abnormality identified. Pancreas: Unremarkable. No pancreatic ductal dilatation or surrounding inflammatory changes. Spleen: Normal in size. Small peripheral wedge-shaped areas of low attenuation concerning for splenic infarcts. Adrenals/Urinary Tract: Normal appearance of the adrenal glands. There are several small right kidney lesions, too small to reliably characterize. No hydronephrosis bilaterally. Urinary bladder is negative. Stomach/Bowel: The stomach is nondistended. The proximal small bowel loops have a normal caliber. Abnormal bowel wall edema involving the terminal ileum is identified, similar to previous exam. Large necrotic mass within the right abdomen centered around the ascending colon is again noted measuring 10.2 by 8.6 by 10.1 cm (volume = 460 cm^3). Previously this measured 9.9 x 9.9 by 9.6 cm (volume = 490 cm^3). As noted previously tumor appears to involve the second and third portions of the duodenum. Distal colon unremarkable. Vascular/Lymphatic: Aortic atherosclerosis without aneurysm. No abdominopelvic adenopathy.  Reproductive: Prostate is unremarkable. Other: Diffuse abdominopelvic ascites is identified. Musculoskeletal: No acute or significant osseous findings. IMPRESSION: 1. Similar size of large fungating mass within the right hemiabdomen centered around the ascending colon. Invasion of the second and third portions of the duodenum noted. 2. Ascites. 3. Small bilateral pleural effusions. Mural filling defect along the right lateral wall of the ascending thoracic aorta is concerning for thrombus, image 44/6. 4. Small splenic infarcts. Electronically Signed   By: Kerby Moors M.D.   On: 07/20/2018 09:12   Mr Jeri Cos DX Contrast  Result Date: 07/20/2018 CLINICAL DATA:  53 y/o M; left arm weakness. Possible colon cancer. EXAM: MRI HEAD WITHOUT AND WITH CONTRAST TECHNIQUE: Multiplanar, multiecho pulse sequences of the brain and surrounding structures were obtained without and with intravenous contrast. CONTRAST:  5 cc Gadavist. COMPARISON:  None. FINDINGS: Brain: Numerous punctate foci of reduced diffusion are present throughout the right greater than left posterior frontal lobes, bilateral parietal lobes, right posterior temporal lobe, bilateral occipital lobes, and the right cerebellar hemisphere. Foci of reduced diffusion demonstrate T2 FLAIR hyperintensity. No hemorrhage. After administration of intravenous contrast there is no abnormal enhancement. No extra-axial collection, hydrocephalus, mass effect, or herniation. Vascular: Normal flow voids. Skull and upper cervical spine: Normal marrow signal. Sinuses/Orbits: Negative. Other: None. IMPRESSION: 1. Numerous punctate foci of acute/early subacute infarction are present throughout the right greater than left posterior cerebral hemispheres and the right cerebellar hemisphere. Multiple vascular territories suggests embolic etiology. No hemorrhage or mass effect. 2. No enhancing intracranial metastasis identified. These results will be called to the ordering clinician  or representative by the Radiologist Assistant, and communication documented in the PACS or zVision Dashboard. Electronically Signed   By: Kristine Garbe M.D.   On: 07/20/2018 01:18   Ct Abdomen Pelvis W Contrast  Result Date: 07/20/2018 CLINICAL DATA:  Colon cancer. EXAM: CT CHEST, ABDOMEN, AND PELVIS WITH CONTRAST TECHNIQUE: Multidetector CT imaging of the chest, abdomen and pelvis was performed following the standard protocol during bolus administration of intravenous contrast. CONTRAST:  131mL OMNIPAQUE IOHEXOL 300 MG/ML  SOLN COMPARISON:  07/06/2018 FINDINGS: CT CHEST FINDINGS Cardiovascular: The heart size is normal. Calcification in the LAD coronary artery. There is a 2.5 cm  low-attenuation filling defect arising from the right lateral wall of the ascending thoracic aorta concerning for thrombus. Mediastinum/Nodes: Normal appearance of the thyroid gland. The trachea appears patent and is midline. Normal appearance of the esophagus. No mediastinal or hilar adenopathy. No axillary or supraclavicular adenopathy. Lungs/Pleura: Small bilateral pleural effusions, similar. Moderate changes of centrilobular and paraseptal emphysema. Scar like density identified in the right apex. No suspicious pulmonary nodule or mass identified. Musculoskeletal: No aggressive lytic or sclerotic bone lesions. CT ABDOMEN PELVIS FINDINGS Hepatobiliary: Low-density structure in lateral segment of left lobe of liver is unchanged measuring 9 mm, image 63/3. No new liver abnormality identified. Pancreas: Unremarkable. No pancreatic ductal dilatation or surrounding inflammatory changes. Spleen: Normal in size. Small peripheral wedge-shaped areas of low attenuation concerning for splenic infarcts. Adrenals/Urinary Tract: Normal appearance of the adrenal glands. There are several small right kidney lesions, too small to reliably characterize. No hydronephrosis bilaterally. Urinary bladder is negative. Stomach/Bowel: The stomach  is nondistended. The proximal small bowel loops have a normal caliber. Abnormal bowel wall edema involving the terminal ileum is identified, similar to previous exam. Large necrotic mass within the right abdomen centered around the ascending colon is again noted measuring 10.2 by 8.6 by 10.1 cm (volume = 460 cm^3). Previously this measured 9.9 x 9.9 by 9.6 cm (volume = 490 cm^3). As noted previously tumor appears to involve the second and third portions of the duodenum. Distal colon unremarkable. Vascular/Lymphatic: Aortic atherosclerosis without aneurysm. No abdominopelvic adenopathy. Reproductive: Prostate is unremarkable. Other: Diffuse abdominopelvic ascites is identified. Musculoskeletal: No acute or significant osseous findings. IMPRESSION: 1. Similar size of large fungating mass within the right hemiabdomen centered around the ascending colon. Invasion of the second and third portions of the duodenum noted. 2. Ascites. 3. Small bilateral pleural effusions. Mural filling defect along the right lateral wall of the ascending thoracic aorta is concerning for thrombus, image 44/6. 4. Small splenic infarcts. Electronically Signed   By: Kerby Moors M.D.   On: 07/20/2018 09:12   Ct Abdomen Pelvis W Contrast  Result Date: 07/06/2018 CLINICAL DATA:  Left lower quadrant pain EXAM: CT ABDOMEN AND PELVIS WITH CONTRAST TECHNIQUE: Multidetector CT imaging of the abdomen and pelvis was performed using the standard protocol following bolus administration of intravenous contrast. CONTRAST:  113mL OMNIPAQUE IOHEXOL 300 MG/ML  SOLN COMPARISON:  None. FINDINGS: Lower chest: Small bilateral pleural effusions are noted right greater than left with mild bibasilar atelectatic changes. Hepatobiliary: Liver is well visualized and within normal limits. The gallbladder is well distended without cholelithiasis. Pancreas: Unremarkable. No pancreatic ductal dilatation or surrounding inflammatory changes. Spleen: Normal in size without  focal abnormality. Adrenals/Urinary Tract: Adrenal glands are within normal limits. The kidneys demonstrate a normal enhancement pattern and normal excretion bilaterally. A small cortical cyst is noted within the right kidney measuring approximately 1 cm. The bladder is well distended. Stomach/Bowel: The distal colon appears within normal limits. In the ascending colon there is a large colonic mass with changes highly suggestive of contained perforation. This measures approximately 9.9 x 9.9 cm in greatest AP and transverse dimensions. This extends along the margin of the second portion of the duodenum and duodenal wall thickening is noted suggestive of localized invasion. Mottled air fluid collection is noted adjacent to the column contrast which likely represents contained perforation. Free fluid is noted within the pelvis consistent with the perforation. The appendix is within normal limits. The jejunum and ileum are within normal limits with the exception of some wall thickening  in the distal ileum related to the localized inflammatory change from the colonic mass. Vascular/Lymphatic: Aortic atherosclerosis. No enlarged abdominal or pelvic lymph nodes. Reproductive: Prostate is unremarkable. Other: Free fluid is noted within the pelvis as previously described. This also extends higher in the abdomen along the spleen and liver consistent with the findings in the ascending colon consistent with contained perforation. Musculoskeletal: No acute bony abnormality is seen. IMPRESSION: Large fungating mass in the ascending colon with changes highly suggestive of contained perforation and adjacent abscess formation. This extends along the margin of the second portion of the duodenum in the possibility of localized invasion of the mass deserves consideration. Surgical consultation is recommended. Free fluid within the abdomen consistent with the known perforated mass. No definitive extravasation of contrast material is  noted at this time consistent with a more contained perforation. Bilateral pleural effusions with mild bibasilar atelectasis. Electronically Signed   By: Inez Catalina M.D.   On: 07/06/2018 17:55   Dg Abd Portable 1v  Result Date: 07/25/2018 CLINICAL DATA:  53 year old male with history of abdominal pain and malnutrition. EXAM: PORTABLE ABDOMEN - 1 VIEW COMPARISON:  No priors. FINDINGS: Gas and stool are seen scattered throughout the colon extending to the level of the distal rectum. No pathologic distension of small bowel is noted. No gross evidence of pneumoperitoneum. IMPRESSION: 1. Nonobstructive bowel gas pattern. 2. No pneumoperitoneum. Electronically Signed   By: Vinnie Langton M.D.   On: 07/25/2018 09:15   Ct Angio Chest Aorta W/cm &/or Wo/cm  Result Date: 07/21/2018 CLINICAL DATA:  53 year old male with history of embolic strokes. Possible thrombus in the ascending thoracic aorta noted on prior non gated chest CT. Follow-up study. EXAM: CT ANGIOGRAPHY CHEST WITH CONTRAST TECHNIQUE: Multidetector CT imaging of the chest was performed using the standard protocol as well as cardiac gating during bolus administration of intravenous contrast. Multiplanar CT image reconstructions and MIPs were obtained to evaluate the vascular anatomy. CONTRAST:  156mL OMNIPAQUE IOHEXOL 350 MG/ML SOLN COMPARISON:  Chest CT 05/18/20209. FINDINGS: Cardiovascular: Heart size is enlarged with mild left ventricular dilatation. There is also some mild myocardial thinning in the apex of the left ventricle which appears rounded, suggesting sequela of prior distal LAD territory myocardial infarction. There is no significant pericardial fluid, thickening or pericardial calcification. Aortic atherosclerosis, as well as calcified atherosclerotic plaque in the left anterior descending coronary artery. Several areas of mural thickening and intimal irregularity are noted in the ascending thoracic aorta. The first of these is at and  immediately above the level of the sino-tubular junction best appreciated on axial image 100 of series 14, where this focal mural thickening of the posterolateral wall of the ascending thoracic aorta on the right side extends into the lumen over a distance of approximately 11 mm craniocaudally (coronal image 31 of series 18). The second intimal irregularity is best appreciated on axial image 60 of series 14 also in the proximal ascending thoracic aorta slightly cranial to the previously described lesion, without much extension into the lumen. The final lesion is much larger, best appreciated on axial image 24 of series 14 and coronal image 63 of series 10 where a focal area of mural thickening along the right lateral wall of the ascending thoracic aorta measures up to approximately 10 x 8 mm, and a pedunculated portion of the lesion extends into the lumen cephalad over distance of approximately 3.3 cm toward the proximal aortic arch. Mediastinum/Nodes: No pathologically enlarged mediastinal or hilar lymph nodes. Esophagus is  unremarkable in appearance. No axillary lymphadenopathy. Lungs/Pleura: Small bilateral pleural effusions lying dependently with some associated passive subsegmental atelectasis in the lower lobes of the lungs bilaterally. No acute consolidative airspace disease. Diffuse bronchial wall thickening with moderate centrilobular and paraseptal emphysema. No definite suspicious appearing pulmonary nodules or masses are noted. Upper Abdomen: Please refer to dedicated CT the abdomen and pelvis from yesterday for full description of findings beneath the diaphragm. Musculoskeletal: There are no aggressive appearing lytic or blastic lesions noted in the visualized portions of the skeleton. Review of the MIP images confirms the above findings. IMPRESSION: 1. Three focal areas of mural thickening and intimal irregularity in the ascending thoracic aorta, as detailed above. These are highly unusual. Based on  the multiplicity of these lesions, and the patient's history of large colonic mass which shows signs of pending perforation, these are strongly favored to represent infectious vegetations/thrombus. 2. Aortic atherosclerosis, in addition to left anterior descending coronary artery disease. Please note that although the presence of coronary artery calcium documents the presence of coronary artery disease, the severity of this disease and any potential stenosis cannot be assessed on this non-gated CT examination. Assessment for potential risk factor modification, dietary therapy or pharmacologic therapy may be warranted, if clinically indicated. 3. Cardiomegaly with mild left ventricular dilatation. 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 4. Small bilateral pleural effusions lying dependently. 5. Diffuse bronchial wall thickening with moderate centrilobular and paraseptal emphysema; imaging findings suggestive of underlying COPD. Critical Value/emergent results were called by telephone at the time of interpretation on 07/21/2018 at 2:04 pm to [place], who verbally acknowledged these results. Aortic Atherosclerosis (ICD10-I70.0) and Emphysema (ICD10-J43.9). Electronically Signed   By: Vinnie Langton M.D.   On: 07/21/2018 14:05   Vas [place] Upper Extremity Venous Duplex  Result Date: 07/20/2018 UPPER VENOUS STUDY  Indications: Swelling Comparison Study: No prior study on file for comparison Performing Technologist: Sharion Dove RVS  Examination Guidelines: A complete evaluation includes B-mode imaging, spectral Doppler, color Doppler, and power Doppler as needed of all accessible portions of each vessel. Bilateral testing is considered an integral part of a complete examination. Limited examinations for reoccurring indications may be performed as noted.  Right Findings:  +----------+------------+---------+-----------+----------+-------+  RIGHT      Compressible Phasicity Spontaneous Properties Summary  +----------+------------+---------+-----------+----------+-------+  Subclavian                 Yes        Yes                         +----------+------------+---------+-----------+----------+-------+  Left Findings: +----------+------------+---------+-----------+----------+-------+  LEFT       Compressible Phasicity Spontaneous Properties Summary  +----------+------------+---------+-----------+----------+-------+  IJV                        Yes        Yes                         +----------+------------+---------+-----------+----------+-------+  Subclavian     Full        Yes        Yes                         +----------+------------+---------+-----------+----------+-------+  Axillary       Full  Yes        Yes                         +----------+------------+---------+-----------+----------+-------+  Brachial       Full        Yes        Yes                         +----------+------------+---------+-----------+----------+-------+  Radial         Full                                               +----------+------------+---------+-----------+----------+-------+  Ulnar          Full                                               +----------+------------+---------+-----------+----------+-------+  Cephalic     Partial                                     Chronic  +----------+------------+---------+-----------+----------+-------+  Basilic        Full                                               +----------+------------+---------+-----------+----------+-------+  Summary:  Right: No evidence of thrombosis in the subclavian.  Left: No evidence of deep vein thrombosis in the upper extremity. 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 Chronic partial thrombus noted in a small portion of the cephalic vein from [facility] to mid upper arm.   *See table(s) above for measurements and observations.  Diagnosing physician: Harold Barban MD Electronically signed by Harold Barban MD on 07/20/2018 at 9:11:19 AM.    Final    [place] Ekg Site Rite  Result Date: 07/21/2018 If Site Rite image not attached, placement could not be confirmed due to current cardiac rhythm.   Lab Data:  CBC: Recent Labs  Lab 07/20/18 0337 07/21/18 0300 07/22/18 0353 07/22/18 1550 07/23/18 0431 07/26/18 0427  WBC 25.0* 21.6* 18.0*  --  16.4* 14.7*  NEUTROABS  --   --  14.9*  --   --   --   HGB 7.6* 7.2* 6.6* 8.9* 8.2* 5.7*  HCT 23.6* 22.0* 21.0* 27.2* 25.6* 18.3*  MCV 81.7 81.8 84.3  --  85.6 88.0  PLT 334 292 287  --  293 338   Basic Metabolic Panel: Recent Labs  Lab 07/20/18 0337 07/21/18 0300 07/22/18 0353 07/23/18 0431 07/24/18 0359 07/25/18 0352 07/26/18 0427  NA 132* 133* 131* 132* 133* 132* 131*  K 3.9 3.6 3.7 3.8 3.8 4.0 3.9  CL 106 104 103 104 102 104 102  CO2 20* 22 23 23 24 24 23   GLUCOSE 85 97 95 107* 108* 106* 101*  BUN 12 9 10 13 13 15 17   CREATININE 0.54* 0.48* 0.40* 0.38* 0.34* 0.32* <0.30*  CALCIUM 7.2* 7.3* 7.1* 7.2* 7.2* 7.2* 7.2*  MG 1.9 2.0 1.8  2.1  --   --  2.0  PHOS 2.6  --  2.8 2.4*  --   --  3.0   GFR: CrCl cannot be calculated (This lab value cannot be used to calculate CrCl because it is not a number: <0.30). Liver Function Tests: Recent Labs  Lab 07/20/18 0337 07/22/18 0353 07/23/18 0431  AST 10* 10* 15  ALT 9 11 14   ALKPHOS 74 70 75  BILITOT 1.3* 0.8 0.6  PROT 5.0* 4.5* 5.0*  ALBUMIN 1.3* 1.2* 1.3*   No results for input(s): LIPASE, AMYLASE in the last 168 hours. No results for input(s): AMMONIA in the last 168 hours. Coagulation Profile: No results for input(s): INR, PROTIME in the last 168 hours. Cardiac Enzymes: No results for input(s): CKTOTAL, CKMB, CKMBINDEX, TROPONINI in the last 168 hours. BNP (last 3 results) No results for input(s): PROBNP in the last 8760 hours. HbA1C: No results for  input(s): HGBA1C in the last 72 hours. CBG: Recent Labs  Lab 07/25/18 1208 07/25/18 1714 07/25/18 2109 07/25/18 2329 07/26/18 0607  GLUCAP 110* 102* 111* 110* 105*   Lipid Profile: No results for input(s): CHOL, HDL, LDLCALC, TRIG, CHOLHDL, LDLDIRECT in the last 72 hours. Thyroid Function Tests: No results for input(s): TSH, T4TOTAL, FREET4, T3FREE, THYROIDAB in the last 72 hours. Anemia Panel: No results for input(s): VITAMINB12, FOLATE, FERRITIN, TIBC, IRON, RETICCTPCT in the last 72 hours. Urine analysis: No results found for: COLORURINE, APPEARANCEUR, LABSPEC, PHURINE, GLUCOSEU, HGBUR, BILIRUBINUR, KETONESUR, PROTEINUR, UROBILINOGEN, NITRITE, LEUKOCYTESUR   Maudie Shingledecker M.D. [facility] 07/26/2018, 2:16 PM  Pager: [phone] Between 7am to 7pm - call Pager - [phone]  After 7pm go to [email or web address] - password [email or web address]  Call night coverage person covering after 7pm

## 2018-07-27 ENCOUNTER — Inpatient Hospital Stay (HOSPITAL_COMMUNITY): Payer: Medicaid Other

## 2018-07-27 LAB — CBC
HCT: 20.6 % — ABNORMAL LOW (ref 39.0–52.0)
Hemoglobin: 6.5 g/dL — CL (ref 13.0–17.0)
MCH: 27.9 pg (ref 26.0–34.0)
MCHC: 31.6 g/dL (ref 30.0–36.0)
MCV: 88.4 fL (ref 80.0–100.0)
Platelets: 343 10*3/uL (ref 150–400)
RBC: 2.33 MIL/uL — ABNORMAL LOW (ref 4.22–5.81)
RDW: 17.8 % — ABNORMAL HIGH (ref 11.5–15.5)
WBC: 14.2 10*3/uL — ABNORMAL HIGH (ref 4.0–10.5)
nRBC: 0 % (ref 0.0–0.2)

## 2018-07-27 LAB — URINALYSIS, ROUTINE W REFLEX MICROSCOPIC
Bilirubin Urine: NEGATIVE
Glucose, UA: NEGATIVE mg/dL
Hgb urine dipstick: NEGATIVE
Ketones, ur: NEGATIVE mg/dL
Leukocytes,Ua: NEGATIVE
Nitrite: NEGATIVE
Protein, ur: NEGATIVE mg/dL
Specific Gravity, Urine: 1.012 (ref 1.005–1.030)
pH: 8 (ref 5.0–8.0)

## 2018-07-27 LAB — DIFFERENTIAL
Abs Immature Granulocytes: 0.06 10*3/uL (ref 0.00–0.07)
Basophils Absolute: 0 10*3/uL (ref 0.0–0.1)
Basophils Relative: 0 %
Eosinophils Absolute: 0.2 10*3/uL (ref 0.0–0.5)
Eosinophils Relative: 1 %
Immature Granulocytes: 0 %
Lymphocytes Relative: 7 %
Lymphs Abs: 1 10*3/uL (ref 0.7–4.0)
Monocytes Absolute: 1.3 10*3/uL — ABNORMAL HIGH (ref 0.1–1.0)
Monocytes Relative: 9 %
Neutro Abs: 11.5 10*3/uL — ABNORMAL HIGH (ref 1.7–7.7)
Neutrophils Relative %: 83 %

## 2018-07-27 LAB — COMPREHENSIVE METABOLIC PANEL
ALT: 26 U/L (ref 0–44)
AST: 27 U/L (ref 15–41)
Albumin: 1.2 g/dL — ABNORMAL LOW (ref 3.5–5.0)
Alkaline Phosphatase: 64 U/L (ref 38–126)
Anion gap: 6 (ref 5–15)
BUN: 17 mg/dL (ref 6–20)
CO2: 22 mmol/L (ref 22–32)
Calcium: 7.5 mg/dL — ABNORMAL LOW (ref 8.9–10.3)
Chloride: 105 mmol/L (ref 98–111)
Creatinine, Ser: <0.3 mg/dL — ABNORMAL LOW (ref 0.61–1.24)
Glucose, Bld: 104 mg/dL — ABNORMAL HIGH (ref 70–99)
Potassium: 3.9 mmol/L (ref 3.5–5.1)
Sodium: 133 mmol/L — ABNORMAL LOW (ref 135–145)
Total Bilirubin: 0.5 mg/dL (ref 0.3–1.2)
Total Protein: 5.1 g/dL — ABNORMAL LOW (ref 6.5–8.1)

## 2018-07-27 LAB — TRIGLYCERIDES: Triglycerides: 63 mg/dL (ref <150)

## 2018-07-27 LAB — HEMOGLOBIN AND HEMATOCRIT, BLOOD
HCT: 22.9 % — ABNORMAL LOW (ref 39.0–52.0)
Hemoglobin: 7.9 g/dL — ABNORMAL LOW (ref 13.0–17.0)

## 2018-07-27 LAB — PHOSPHORUS: Phosphorus: 3.1 mg/dL (ref 2.5–4.6)

## 2018-07-27 LAB — PREALBUMIN: Prealbumin: 7.3 mg/dL — ABNORMAL LOW (ref 18–38)

## 2018-07-27 LAB — MAGNESIUM: Magnesium: 2.1 mg/dL (ref 1.7–2.4)

## 2018-07-27 LAB — GLUCOSE, CAPILLARY: Glucose-Capillary: 115 mg/dL — ABNORMAL HIGH (ref 70–99)

## 2018-07-27 MED ORDER — CHLORHEXIDINE GLUCONATE CLOTH 2 % EX PADS
6.0000 | MEDICATED_PAD | Freq: Every day | CUTANEOUS | Status: DC
  Administered 2018-07-27 – 2018-08-06 (×9): 6 via TOPICAL

## 2018-07-27 MED ORDER — TRAVASOL 10 % IV SOLN
INTRAVENOUS | Status: AC
  Administered 2018-07-27: 18:00:00 via INTRAVENOUS
  Filled 2018-07-27: qty 1098

## 2018-07-27 NOTE — Progress Notes (Signed)
PHARMACY - ADULT TOTAL PARENTERAL NUTRITION CONSULT NOTE   Pharmacy Consult for TPN Indication: Severe malnutrition; R-colon mass extending into duodenum   Patient Measurements: Height: 5' 8.11" (173 cm) Weight: 115 lb 11.9 oz (52.5 kg) IBW/kg (Calculated) : 68.65   Body mass index is 17.54 kg/m.   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 He is very malnourished and refuses to eat (only wants [place]). He is ok with TPN and needs improved nutrition if going to have surgery. Palliative care on board.   GI: Pre-albumin <5. LBM 5/22; emesis 200 mL Endo: CBGs < 140s with TPN. Glucose has been in control; no SSI Insulin requirements in the past 24 hours: 0 units Lytes: Na low 131>133, K 3.9, Phos 3.1 and Mag 2.1  Renal: SCr <0.3, BUN 17, Good UOP Pulm: no issues - RA Cards: VSS Hepatobil: LFT/Tbili/TG within normal limits.  Neuro: pain 0-2. Got high dose Thiamine, now with 100mg  daily ID: WBC 14.2 on Zosyn. Afebrile. PCT 0.10.  TPN Access: PICC  TPN start date: 07/21/18 Nutritional Goals (per RD recommendation on 5/19): KCal: 1850-2050 Protein: 105-120g Fluid: >1.8L  Goal TPN rate is 75 ml/hr (provides 110g protein, 54g lipids, and 261g dextrose, 1867 kcal meeting 100% of needs)  Current Nutrition:  Regular diet- 0% Ensure Enlive - 0 yesterday; continues to not eat per RN  Plan:  Continue TPN at 75 mL/hr.  This TPN provides 110g protein, 54g lipids, and 261g dextrose, 1867 kcal meeting 100% of needs Electrolytes in TPN: will increase Na more and decrease Phos and continue slightly increased Mg, others standard for now. CN:[id number] 1:1.  Add MVI to TPN Trace Elements on MWF only due to shortage  Continue Thiamine 100mg  po daily as oral medication. Monitor TPN labs Follow-up plan of care  Levester Fresh, PharmD, BCPS, BCCCP Clinical Pharmacist [phone]  Please check AMION for all [place] numbers  07/27/2018 9:47 AM

## 2018-07-27 NOTE — Progress Notes (Signed)
PT Cancellation Note  Patient Details Name: Brandon Landry MRN: [id number] DOB: April 22, 1965   Cancelled Treatment:    Reason Eval/Treat Not Completed: Patient declined, no reason specified Patient declined mobilizing at this time and reports he wants to eat. Therapist assisted pt with placing lunch order. Pt had lunch order automatically placed which was meatloaf and macaroni and cheese. Pt states "I hate macaroni and cheese!", he also "hates" ice cream but he will eat rice and carrots. PT will continue to follow acutely.    Earney Navy, PTA Acute Rehabilitation Services Pager: [phone] Office: [phone]   07/27/2018, 10:52 AM

## 2018-07-27 NOTE — Progress Notes (Signed)
[facility]                                                                              Patient Demographics  Brandon Landry, is a 53 y.o. male, DOB - 07-31-1965, CHE:[id number]  Admit date - 07/19/2018   Admitting Physician Ivor Costa, MD  Outpatient Primary MD for the patient is Patient, No Pcp Per  Outpatient specialists:   LOS - 8  days   Medical records reviewed and are as summarized below:    No chief complaint on file.      Brief summary  Per HPI: Patient is a89 y.o.malewho was hospitalized from 5/4 to 5/6 after he was found to have severe microcytic anemia with a hemoglobin of 3.1, he was transfused PRBC, CT scan of the abdomen on 5/4 showed a large fungating mass in the ascending colon with contained perforation and adjacent abscess formation, general surgery was subsequently consulted-surgical intervention was contemplated-however patient refused and was subsequently discharged home on 5/6. He returned [facility] on 5/16 with generalized weakness, and was found to have a hemoglobin of 3.2. He was transfused 2 units of PRBC, and transferred to [facility]. He now wants to pursue surgical options. General surgery was consulted, cannot undergo surgery due to severe malnutrition, patient refuses to eat American food.  Palliative care was consulted, currently working with patient and family regarding goals of care.  PICC line has been placed and patient has been started on TPN for nourishment. Patient now wishes to go to [place] for all of his medical care, being investigated by palliative medicine. 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  Initially, there appeared to be a thrombus in the ascending aorta versus dissection.  He was offered CT chest with angiogram on 5/18 and refused, but was agreeable on 5/19.  Assessment & Plan    Principal Problem: Severe microcytic  symptomatic anemia -Presented with hemoglobin of 3.2, secondary to chronic blood loss, acute illness, colon mass -Patient has received 2 units packed RBC transfusion at [facility], continue supportive care -Transfused 1 unit packed RBC on 5/20, H&H 6.5, transfused 2 units packed RBCs, hemoglobin 7.9 -Repeat CT abdomen did not show any acute interval change, large fungating colon mass.  Active problems Large fungating mass in the ascending colon with probable perforation and adjacent abscess -Currently no abdominal pain or fevers -Repeat CT showed large fungating mass in the right hemiabdomen centered around the ascending colon, invasion of the second and third portions of the duodenum, ascites -General surgery has signed off, patient has not been cooperative and would like to pursue further management in [place] and also severely malnourished. -Patient does not like clear liquid or full liquid diet, wants solids.  However solids does make him nauseous and had vomiting episode yesterday.  Also does not like American food.   Left arm weakness, bilateral strokes -MRI brain showed numerous punctate foci of acute/early subacute infarctions in the right greater than left posterior cerebral hemispheres, right cerebellar hemispheres, no enhancing metastasis. -CT chest showed small bilateral pleural effusions with  mural filling defect along the left right lateral wall of ascending thoracic aorta concerning for thrombus -CTA aorta showed several areas of mural thickening and intimal irregularity noted in the ascending aorta favoring infectious vegetation/thrombus -2D echo showed EF of 40 to 45% with low flow state LV recommend rule out thrombus -Neurology following, not on antithrombotic agents given severe anemia.  Not a candidate for anticoagulation unless anemia and bleeding source is secured.  However patient has refused surgery for the last fungating colon mass.  Ascending aorta  vegetation/thrombus -Neurology following, CTA aorta showed several areas of mural thickening and intimal irregularity in the ascending aorta -Likely related to advanced metastatic colon cancer, not a candidate for anticoagulation due to severe anemia and refusal for the surgery or further management -Recommended palliative and hospice at this time  Left arm swelling -Doppler studies negative for DVT  Severe protein calorie malnutrition, hypoalbuminemia, anasarca -Appreciate dietitian consultation, continue TPN with PICC line -Continue regular diet however patient is not eating much -continue TNA  Goals of care -Palliative medicine following, overall poor prognosis -Unfortunately no local support due to recent divorce and immediate family in [place] and [place].  Patient will either need hospice or SNF/LTAC while awaiting disposition to [place]  Discussed with social work and palliative medicine, patient is also awaiting transport to [place].  - Palliative medicine will follow-up on Tuesday, if patient is fit for travel.  -Representative from [place] has been following, patient will be transported with the medical jet to [place] when patient is deemed fit for travel.  Code Status: Full code DVT Prophylaxis:  SCD's Family Communication: Discussed in detail with the patient, all imaging results, lab results explained to the patient.  Palliative medicine has been in contact with his family in [place] and [place].    Disposition Plan: Awaiting disposition to [place].  May need skilled nursing facility/LTAC if medical travel to [place] is not feasible  Time Spent in minutes 25 minutes  Procedures:   CT abdomen  Consultants:   General surgery Palliative medicine Neurology  Antimicrobials:   Anti-infectives (From admission, onward)   Start     Dose/Rate Route Frequency Ordered Stop   07/19/18 1400  piperacillin-tazobactam (ZOSYN) IVPB 3.375 g     3.375 g 12.5 mL/hr over 240  Minutes Intravenous Every 8 hours 07/19/18 0651     07/19/18 0600  piperacillin-tazobactam (ZOSYN) IVPB 3.375 g  Status:  Discontinued     3.375 g 12.5 mL/hr over 240 Minutes Intravenous Every 8 hours 07/19/18 0442 07/19/18 0447   07/19/18 0500  piperacillin-tazobactam (ZOSYN) IVPB 3.375 g     3.375 g 100 mL/hr over 30 Minutes Intravenous  Once 07/19/18 0447 07/19/18 1445         Medications  Scheduled Meds:  sodium chloride   Intravenous Once   sodium chloride   Intravenous Once   sodium chloride   Intravenous Once   sodium chloride   Intravenous Once   Chlorhexidine Gluconate Cloth  6 each Topical Q0600   feeding supplement (PRO-STAT SUGAR FREE 64)  30 mL Oral BID   nicotine  21 mg Transdermal Daily   ondansetron  4 mg Oral Once   thiamine  100 mg Oral Daily   Continuous Infusions:  sodium chloride 50 mL/hr at 07/22/18 0450   piperacillin-tazobactam (ZOSYN)  IV 3.375 g (07/27/18 0843)   TPN ADULT (ION) 75 mL/hr at 07/26/18 1749   TPN ADULT (ION)     PRN Meds:.sodium chloride, acetaminophen **OR**  acetaminophen, ondansetron **OR** ondansetron (ZOFRAN) IV, sodium chloride flush      Subjective:   Brandon Landry was seen and examined today.  Tearful, he feels he is dying.  Does not like clear liquid diet or full liquid diet.  Wants solids.  No fevers or chills.  Currently abdominal pain tolerable.   Objective:   Vitals:   07/26/18 1540 07/27/18 0501 07/27/18 0517 07/27/18 0830  BP: 106/75 109/74 106/75 111/79  Pulse: 90 85 81 82  Resp: 17 16 16 17   Temp: 98.1 F (36.7 C) 98 F (36.7 C) (!) 97.3 F (36.3 C) 97.7 F (36.5 C)  TempSrc: Oral Oral Oral Oral  SpO2: 100% 100% 100% 100%  Weight:      Height:        Intake/Output Summary (Last 24 hours) at 07/27/2018 1314 Last data filed at 07/27/2018 1146 Gross per 24 hour  Intake 1010.37 ml  Output 1650 ml  Net -639.63 ml     Wt Readings from Last 3 Encounters:  07/26/18 52.5 kg  07/06/18 50.1 kg     Physical Exam  General: Alert and oriented x 3, cachectic, ill-appearing, tearful  Eyes:   HEENT:  Atraumatic, normocephalic  Cardiovascular: S1 S2 clear, RRR. No pedal edema b/l  Respiratory: CTAB, no wheezing, rales or rhonchi  Gastrointestinal: Soft, nontender, nondistended, NBS  Ext: no pedal edema bilaterally  Neuro: no new deficits  Musculoskeletal: No cyanosis, clubbing  Skin: No rashes  Psych: Tearful   data Reviewed:  I have personally reviewed following labs and imaging studies  Micro Results Recent Results (from the past 240 hour(s))  Culture, blood (Routine X 2) w Reflex to ID Panel     Status: None   Collection Time: 07/19/18  5:34 AM  Result Value Ref Range Status   Specimen Description BLOOD RIGHT ARM  Final   Special Requests   Final    BOTTLES DRAWN AEROBIC ONLY Blood Culture adequate volume   Culture   Final    NO GROWTH 5 DAYS Performed at [facility], [address], [place], [place] [address]    Report Status 07/24/2018 FINAL  Final  Culture, blood (Routine X 2) w Reflex to ID Panel     Status: None   Collection Time: 07/19/18  5:34 AM  Result Value Ref Range Status   Specimen Description BLOOD LEFT ANTECUBITAL  Final   Special Requests   Final    BOTTLES DRAWN AEROBIC ONLY Blood Culture results may not be optimal due to an inadequate volume of blood received in culture bottles   Culture   Final    NO GROWTH 5 DAYS Performed at [facility], [place] [address] [place], [place] [address]    Report Status 07/24/2018 FINAL  Final    Radiology Reports Ct Abdomen Pelvis Wo Contrast  Result Date: 07/27/2018 CLINICAL DATA:  Initial evaluation for acute nausea, vomiting. History of colonic mass. EXAM: CT ABDOMEN AND PELVIS WITHOUT CONTRAST TECHNIQUE: Multidetector CT imaging of the abdomen and pelvis was performed following the standard protocol without IV contrast. COMPARISON:  Recent CT from 07/20/2018 FINDINGS: Lower chest: Small  to moderate layering bilateral pleural effusions, right slightly larger than left. Associated mild bibasilar subsegmental atelectasis. Visualized lungs are otherwise clear. Hepatobiliary: 9 mm cystic lesion again noted within left hepatic lobe, indeterminate. Limited noncontrast evaluation of the liver otherwise unremarkable. Gallbladder within normal limits. No biliary dilatation. Pancreas: Pancreas within normal limits. Spleen: Spleen within normal limits. Adrenals/Urinary  Tract: Adrenal glands are normal. Kidneys equal in size. Bilateral nonobstructive nephrolithiasis measuring up to 3-4 mm noted. No appreciable radiopaque calculi seen along the course of either renal collecting system. No hydronephrosis or hydroureter. No discrete renal lesions on this noncontrast examination. Bladder moderately distended. Layering hyperdensity within the posterior bladder lumen could reflect proteinaceous material and/or blood products. No stones seen layering within the bladder lumen. Stomach/Bowel: Stomach moderately distended without acute abnormality. No evidence for bowel obstruction. Previously identified large fungating necrotic mass centered at the right hemiabdomen at the level of the ascending colon again seen, measuring approximately 11.5 x 10.7 x 10.8 cm, grossly similar to previous, although better seen on prior study with IV contrast. Note again made of the tumor involving the second and third portions of the duodenum, also better seen on previous. Diffuse wall thickening of the ascending colon noted proximally. Remainder of the colon of normal caliber without acute finding. Vascular/Lymphatic: Decreased density within the cardiac blood pool suggestive of anemia. Mild aorto bi-iliac atherosclerotic disease. No aneurysm. No visible adenopathy on this noncontrast examination. Reproductive: Prostate normal. Other: Large volume ascites throughout the abdomen and pelvis, relatively similar. No free intraperitoneal air.  Musculoskeletal: No acute osseous finding. No discrete lytic or blastic osseous lesions. Diffuse anasarca noted within the external soft tissues. IMPRESSION: 1. No significant interval change in size and appearance of large necrotic fungating colonic mass centered about the ascending colon, with invasion of the second and third portions of the duodenum. No associated obstruction, pneumoperitoneum, or other interval complication. 2. Large volume ascites with small to moderate bilateral pleural effusions and diffuse anasarca, similar to previous. 3. Bilateral nonobstructive nephrolithiasis. 4. Layering hyperdensity within the posterior bladder lumen, which could reflect she proteinaceous material/sedimentation and/or blood products. Correlation with urinalysis recommended. Finding is similar to previous. Electronically Signed   By: Jeannine Boga M.D.   On: 07/27/2018 00:55   Ct Chest W Contrast  Result Date: 07/20/2018 CLINICAL DATA:  Colon cancer. EXAM: CT CHEST, ABDOMEN, AND PELVIS WITH CONTRAST TECHNIQUE: Multidetector CT imaging of the chest, abdomen and pelvis was performed following the standard protocol during bolus administration of intravenous contrast. CONTRAST:  164mL OMNIPAQUE IOHEXOL 300 MG/ML  SOLN COMPARISON:  07/06/2018 FINDINGS: CT CHEST FINDINGS Cardiovascular: The heart size is normal. Calcification in the LAD coronary artery. There is a 2.5 cm low-attenuation filling defect arising from the right lateral wall of the ascending thoracic aorta concerning for thrombus. Mediastinum/Nodes: Normal appearance of the thyroid gland. The trachea appears patent and is midline. Normal appearance of the esophagus. No mediastinal or hilar adenopathy. No axillary or supraclavicular adenopathy. Lungs/Pleura: Small bilateral pleural effusions, similar. Moderate changes of centrilobular and paraseptal emphysema. Scar like density identified in the right apex. No suspicious pulmonary nodule or mass  identified. Musculoskeletal: No aggressive lytic or sclerotic bone lesions. CT ABDOMEN PELVIS FINDINGS Hepatobiliary: Low-density structure in lateral segment of left lobe of liver is unchanged measuring 9 mm, image 63/3. No new liver abnormality identified. Pancreas: Unremarkable. No pancreatic ductal dilatation or surrounding inflammatory changes. Spleen: Normal in size. Small peripheral wedge-shaped areas of low attenuation concerning for splenic infarcts. Adrenals/Urinary Tract: Normal appearance of the adrenal glands. There are several small right kidney lesions, too small to reliably characterize. No hydronephrosis bilaterally. Urinary bladder is negative. Stomach/Bowel: The stomach is nondistended. The proximal small bowel loops have a normal caliber. Abnormal bowel wall edema involving the terminal ileum is identified, similar to previous exam. Large necrotic mass within the right abdomen  centered around the ascending colon is again noted measuring 10.2 by 8.6 by 10.1 cm (volume = 460 cm^3). Previously this measured 9.9 x 9.9 by 9.6 cm (volume = 490 cm^3). As noted previously tumor appears to involve the second and third portions of the duodenum. Distal colon unremarkable. Vascular/Lymphatic: Aortic atherosclerosis without aneurysm. No abdominopelvic adenopathy. Reproductive: Prostate is unremarkable. Other: Diffuse abdominopelvic ascites is identified. Musculoskeletal: No acute or significant osseous findings. IMPRESSION: 1. Similar size of large fungating mass within the right hemiabdomen centered around the ascending colon. Invasion of the second and third portions of the duodenum noted. 2. Ascites. 3. Small bilateral pleural effusions. Mural filling defect along the right lateral wall of the ascending thoracic aorta is concerning for thrombus, image 44/6. 4. Small splenic infarcts. Electronically Signed   By: Kerby Moors M.D.   On: 07/20/2018 09:12   Mr Jeri Cos IO Contrast  Result Date:  07/20/2018 CLINICAL DATA:  53 y/o M; left arm weakness. Possible colon cancer. EXAM: MRI HEAD WITHOUT AND WITH CONTRAST TECHNIQUE: Multiplanar, multiecho pulse sequences of the brain and surrounding structures were obtained without and with intravenous contrast. CONTRAST:  5 cc Gadavist. COMPARISON:  None. FINDINGS: Brain: Numerous punctate foci of reduced diffusion are present throughout the right greater than left posterior frontal lobes, bilateral parietal lobes, right posterior temporal lobe, bilateral occipital lobes, and the right cerebellar hemisphere. Foci of reduced diffusion demonstrate T2 FLAIR hyperintensity. No hemorrhage. After administration of intravenous contrast there is no abnormal enhancement. No extra-axial collection, hydrocephalus, mass effect, or herniation. Vascular: Normal flow voids. Skull and upper cervical spine: Normal marrow signal. Sinuses/Orbits: Negative. Other: None. IMPRESSION: 1. Numerous punctate foci of acute/early subacute infarction are present throughout the right greater than left posterior cerebral hemispheres and the right cerebellar hemisphere. Multiple vascular territories suggests embolic etiology. No hemorrhage or mass effect. 2. No enhancing intracranial metastasis identified. These results will be called to the ordering clinician or representative by the Radiologist Assistant, and communication documented in the PACS or zVision Dashboard. Electronically Signed   By: Kristine Garbe M.D.   On: 07/20/2018 01:18   Ct Abdomen Pelvis W Contrast  Result Date: 07/20/2018 CLINICAL DATA:  Colon cancer. EXAM: CT CHEST, ABDOMEN, AND PELVIS WITH CONTRAST TECHNIQUE: Multidetector CT imaging of the chest, abdomen and pelvis was performed following the standard protocol during bolus administration of intravenous contrast. CONTRAST:  140mL OMNIPAQUE IOHEXOL 300 MG/ML  SOLN COMPARISON:  07/06/2018 FINDINGS: CT CHEST FINDINGS Cardiovascular: The heart size is normal.  Calcification in the LAD coronary artery. There is a 2.5 cm low-attenuation filling defect arising from the right lateral wall of the ascending thoracic aorta concerning for thrombus. Mediastinum/Nodes: Normal appearance of the thyroid gland. The trachea appears patent and is midline. Normal appearance of the esophagus. No mediastinal or hilar adenopathy. No axillary or supraclavicular adenopathy. Lungs/Pleura: Small bilateral pleural effusions, similar. Moderate changes of centrilobular and paraseptal emphysema. Scar like density identified in the right apex. No suspicious pulmonary nodule or mass identified. Musculoskeletal: No aggressive lytic or sclerotic bone lesions. CT ABDOMEN PELVIS FINDINGS Hepatobiliary: Low-density structure in lateral segment of left lobe of liver is unchanged measuring 9 mm, image 63/3. No new liver abnormality identified. Pancreas: Unremarkable. No pancreatic ductal dilatation or surrounding inflammatory changes. Spleen: Normal in size. Small peripheral wedge-shaped areas of low attenuation concerning for splenic infarcts. Adrenals/Urinary Tract: Normal appearance of the adrenal glands. There are several small right kidney lesions, too small to reliably characterize. No hydronephrosis bilaterally. Urinary  bladder is negative. Stomach/Bowel: The stomach is nondistended. The proximal small bowel loops have a normal caliber. Abnormal bowel wall edema involving the terminal ileum is identified, similar to previous exam. Large necrotic mass within the right abdomen centered around the ascending colon is again noted measuring 10.2 by 8.6 by 10.1 cm (volume = 460 cm^3). Previously this measured 9.9 x 9.9 by 9.6 cm (volume = 490 cm^3). As noted previously tumor appears to involve the second and third portions of the duodenum. Distal colon unremarkable. Vascular/Lymphatic: Aortic atherosclerosis without aneurysm. No abdominopelvic adenopathy. Reproductive: Prostate is unremarkable. Other:  Diffuse abdominopelvic ascites is identified. Musculoskeletal: No acute or significant osseous findings. IMPRESSION: 1. Similar size of large fungating mass within the right hemiabdomen centered around the ascending colon. Invasion of the second and third portions of the duodenum noted. 2. Ascites. 3. Small bilateral pleural effusions. Mural filling defect along the right lateral wall of the ascending thoracic aorta is concerning for thrombus, image 44/6. 4. Small splenic infarcts. Electronically Signed   By: Kerby Moors M.D.   On: 07/20/2018 09:12   Ct Abdomen Pelvis W Contrast  Result Date: 07/06/2018 CLINICAL DATA:  Left lower quadrant pain EXAM: CT ABDOMEN AND PELVIS WITH CONTRAST TECHNIQUE: Multidetector CT imaging of the abdomen and pelvis was performed using the standard protocol following bolus administration of intravenous contrast. CONTRAST:  140mL OMNIPAQUE IOHEXOL 300 MG/ML  SOLN COMPARISON:  None. FINDINGS: Lower chest: Small bilateral pleural effusions are noted right greater than left with mild bibasilar atelectatic changes. Hepatobiliary: Liver is well visualized and within normal limits. The gallbladder is well distended without cholelithiasis. Pancreas: Unremarkable. No pancreatic ductal dilatation or surrounding inflammatory changes. Spleen: Normal in size without focal abnormality. Adrenals/Urinary Tract: Adrenal glands are within normal limits. The kidneys demonstrate a normal enhancement pattern and normal excretion bilaterally. A small cortical cyst is noted within the right kidney measuring approximately 1 cm. The bladder is well distended. Stomach/Bowel: The distal colon appears within normal limits. In the ascending colon there is a large colonic mass with changes highly suggestive of contained perforation. This measures approximately 9.9 x 9.9 cm in greatest AP and transverse dimensions. This extends along the margin of the second portion of the duodenum and duodenal wall thickening  is noted suggestive of localized invasion. Mottled air fluid collection is noted adjacent to the column contrast which likely represents contained perforation. Free fluid is noted within the pelvis consistent with the perforation. The appendix is within normal limits. The jejunum and ileum are within normal limits with the exception of some wall thickening in the distal ileum related to the localized inflammatory change from the colonic mass. Vascular/Lymphatic: Aortic atherosclerosis. No enlarged abdominal or pelvic lymph nodes. Reproductive: Prostate is unremarkable. Other: Free fluid is noted within the pelvis as previously described. This also extends higher in the abdomen along the spleen and liver consistent with the findings in the ascending colon consistent with contained perforation. Musculoskeletal: No acute bony abnormality is seen. IMPRESSION: Large fungating mass in the ascending colon with changes highly suggestive of contained perforation and adjacent abscess formation. This extends along the margin of the second portion of the duodenum in the possibility of localized invasion of the mass deserves consideration. Surgical consultation is recommended. Free fluid within the abdomen consistent with the known perforated mass. No definitive extravasation of contrast material is noted at this time consistent with a more contained perforation. Bilateral pleural effusions with mild bibasilar atelectasis. Electronically Signed   By: Elta Guadeloupe  Lukens M.D.   On: 07/06/2018 17:55   Dg Abd Portable 1v  Result Date: 07/25/2018 CLINICAL DATA:  53 year old male with history of abdominal pain and malnutrition. EXAM: PORTABLE ABDOMEN - 1 VIEW COMPARISON:  No priors. FINDINGS: Gas and stool are seen scattered throughout the colon extending to the level of the distal rectum. No pathologic distension of small bowel is noted. No gross evidence of pneumoperitoneum. IMPRESSION: 1. Nonobstructive bowel gas pattern. 2. No  pneumoperitoneum. Electronically Signed   By: Vinnie Langton M.D.   On: 07/25/2018 09:15   Ct Angio Chest Aorta W/cm &/or Wo/cm  Result Date: 07/21/2018 CLINICAL DATA:  53 year old male with history of embolic strokes. Possible thrombus in the ascending thoracic aorta noted on prior non gated chest CT. Follow-up study. EXAM: CT ANGIOGRAPHY CHEST WITH CONTRAST TECHNIQUE: Multidetector CT imaging of the chest was performed using the standard protocol as well as cardiac gating during bolus administration of intravenous contrast. Multiplanar CT image reconstructions and MIPs were obtained to evaluate the vascular anatomy. CONTRAST:  1104mL OMNIPAQUE IOHEXOL 350 MG/ML SOLN COMPARISON:  Chest CT 05/18/20209. FINDINGS: Cardiovascular: Heart size is enlarged with mild left ventricular dilatation. There is also some mild myocardial thinning in the apex of the left ventricle which appears rounded, suggesting sequela of prior distal LAD territory myocardial infarction. There is no significant pericardial fluid, thickening or pericardial calcification. Aortic atherosclerosis, as well as calcified atherosclerotic plaque in the left anterior descending coronary artery. Several areas of mural thickening and intimal irregularity are noted in the ascending thoracic aorta. The first of these is at and immediately above the level of the sino-tubular junction best appreciated on axial image 100 of series 14, where this focal mural thickening of the posterolateral wall of the ascending thoracic aorta on the right side extends into the lumen over a distance of approximately 11 mm craniocaudally (coronal image 31 of series 18). The second intimal irregularity is best appreciated on axial image 60 of series 14 also in the proximal ascending thoracic aorta slightly cranial to the previously described lesion, without much extension into the lumen. The final lesion is much larger, best appreciated on axial image 24 of series 14 and  coronal image 63 of series 10 where a focal area of mural thickening along the right lateral wall of the ascending thoracic aorta measures up to approximately 10 x 8 mm, and a pedunculated portion of the lesion extends into the lumen cephalad over distance of approximately 3.3 cm toward the proximal aortic arch. Mediastinum/Nodes: No pathologically enlarged mediastinal or hilar lymph nodes. Esophagus is unremarkable in appearance. No axillary lymphadenopathy. Lungs/Pleura: Small bilateral pleural effusions lying dependently with some associated passive subsegmental atelectasis in the lower lobes of the lungs bilaterally. No acute consolidative airspace disease. Diffuse bronchial wall thickening with moderate centrilobular and paraseptal emphysema. No definite suspicious appearing pulmonary nodules or masses are noted. Upper Abdomen: Please refer to dedicated CT the abdomen and pelvis from yesterday for full description of findings beneath the diaphragm. Musculoskeletal: There are no aggressive appearing lytic or blastic lesions noted in the visualized portions of the skeleton. Review of the MIP images confirms the above findings. IMPRESSION: 1. Three focal areas of mural thickening and intimal irregularity in the ascending thoracic aorta, as detailed above. These are highly unusual. Based on the multiplicity of these lesions, and the patient's history of large colonic mass which shows signs of pending perforation, these are strongly favored to represent infectious vegetations/thrombus. 2. Aortic atherosclerosis, in addition to  left anterior descending coronary artery disease. Please note that although the presence of coronary artery calcium documents the presence of coronary artery disease, the severity of this disease and any potential stenosis cannot be assessed on this non-gated CT examination. Assessment for potential risk factor modification, dietary therapy or pharmacologic therapy may be warranted, if  clinically indicated. 3. Cardiomegaly with mild left ventricular dilatation. 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 4. Small bilateral pleural effusions lying dependently. 5. Diffuse bronchial wall thickening with moderate centrilobular and paraseptal emphysema; imaging findings suggestive of underlying COPD. Critical Value/emergent results were called by telephone at the time of interpretation on 07/21/2018 at 2:04 pm to [place], who verbally acknowledged these results. Aortic Atherosclerosis (ICD10-I70.0) and Emphysema (ICD10-J43.9). Electronically Signed   By: Vinnie Langton M.D.   On: 07/21/2018 14:05   Vas [place] Upper Extremity Venous Duplex  Result Date: 07/20/2018 UPPER VENOUS STUDY  Indications: Swelling Comparison Study: No prior study on file for comparison Performing Technologist: Sharion Dove RVS  Examination Guidelines: A complete evaluation includes B-mode imaging, spectral Doppler, color Doppler, and power Doppler as needed of all accessible portions of each vessel. Bilateral testing is considered an integral part of a complete examination. Limited examinations for reoccurring indications may be performed as noted.  Right Findings: +----------+------------+---------+-----------+----------+-------+  RIGHT      Compressible Phasicity Spontaneous Properties Summary  +----------+------------+---------+-----------+----------+-------+  Subclavian                 Yes        Yes                         +----------+------------+---------+-----------+----------+-------+  Left Findings: +----------+------------+---------+-----------+----------+-------+  LEFT       Compressible Phasicity Spontaneous Properties Summary  +----------+------------+---------+-----------+----------+-------+  IJV                        Yes        Yes                          +----------+------------+---------+-----------+----------+-------+  Subclavian     Full        Yes        Yes                         +----------+------------+---------+-----------+----------+-------+  Axillary       Full        Yes        Yes                         +----------+------------+---------+-----------+----------+-------+  Brachial       Full        Yes        Yes                         +----------+------------+---------+-----------+----------+-------+  Radial         Full                                               +----------+------------+---------+-----------+----------+-------+  Ulnar  Full                                               +----------+------------+---------+-----------+----------+-------+  Cephalic     Partial                                     Chronic  +----------+------------+---------+-----------+----------+-------+  Basilic        Full                                               +----------+------------+---------+-----------+----------+-------+  Summary:  Right: No evidence of thrombosis in the subclavian.  Left: No evidence of deep vein thrombosis in the upper extremity. 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 Chronic partial thrombus noted in a small portion of the cephalic vein from [facility] to mid upper arm.  *See table(s) above for measurements and observations.  Diagnosing physician: Harold Barban MD Electronically signed by Harold Barban MD on 07/20/2018 at 9:11:19 AM.    Final    [place] Ekg Site Rite  Result Date: 07/21/2018 If Site Rite image not attached, placement could not be confirmed due to current cardiac rhythm.   Lab Data:  CBC: Recent Labs  Lab 07/21/18 0300 07/22/18 0353  07/23/18 0431 07/26/18 0427 07/26/18 1739 07/27/18 0357 07/27/18 1108  WBC 21.6* 18.0*  --  16.4* 14.7*  --  14.2*  --   NEUTROABS  --  14.9*  --   --   --   --  11.5*  --   HGB 7.2* 6.6*   < > 8.2* 5.7* 7.4* 6.5* 7.9*    HCT 22.0* 21.0*   < > 25.6* 18.3* 22.9* 20.6* 22.9*  MCV 81.8 84.3  --  85.6 88.0  --  88.4  --   PLT 292 287  --  293 347  --  343  --    < > = values in this interval not displayed.   Basic Metabolic Panel: Recent Labs  Lab 07/21/18 0300 07/22/18 0353 07/23/18 0431 07/24/18 0359 07/25/18 0352 07/26/18 0427 07/27/18 0357  NA 133* 131* 132* 133* 132* 131* 133*  K 3.6 3.7 3.8 3.8 4.0 3.9 3.9  CL 104 103 104 102 104 102 105  CO2 22 23 23 24 24 23 22   GLUCOSE 97 95 107* 108* 106* 101* 104*  BUN 9 10 13 13 15 17 17   CREATININE 0.48* 0.40* 0.38* 0.34* 0.32* <0.30* <0.30*  CALCIUM 7.3* 7.1* 7.2* 7.2* 7.2* 7.2* 7.5*  MG 2.0 1.8 2.1  --   --  2.0 2.1  PHOS  --  2.8 2.4*  --   --  3.0 3.1   GFR: CrCl cannot be calculated (This lab value cannot be used to calculate CrCl because it is not a number: <0.30). Liver Function Tests: Recent Labs  Lab 07/22/18 0353 07/23/18 0431 07/27/18 0357  AST 10* 15 27  ALT 11 14 26   ALKPHOS 70 75 64  BILITOT 0.8 0.6 0.5  PROT 4.5* 5.0* 5.1*  ALBUMIN 1.2* 1.3* 1.2*   No results for input(s): LIPASE, AMYLASE in the last 168 hours. No results for input(s):  AMMONIA in the last 168 hours. Coagulation Profile: No results for input(s): INR, PROTIME in the last 168 hours. Cardiac Enzymes: No results for input(s): CKTOTAL, CKMB, CKMBINDEX, TROPONINI in the last 168 hours. BNP (last 3 results) No results for input(s): PROBNP in the last 8760 hours. HbA1C: No results for input(s): HGBA1C in the last 72 hours. CBG: Recent Labs  Lab 07/25/18 1714 07/25/18 2109 07/25/18 2329 07/26/18 0607 07/27/18 0811  GLUCAP 102* 111* 110* 105* 115*   Lipid Profile: Recent Labs    07/27/18 0357  TRIG 63   Thyroid Function Tests: No results for input(s): TSH, T4TOTAL, FREET4, T3FREE, THYROIDAB in the last 72 hours. Anemia Panel: No results for input(s): VITAMINB12, FOLATE, FERRITIN, TIBC, IRON, RETICCTPCT in the last 72 hours. Urine analysis:     Component Value Date/Time   COLORURINE YELLOW 07/27/2018 0956   APPEARANCEUR CLOUDY (A) 07/27/2018 0956   LABSPEC 1.012 07/27/2018 0956   PHURINE 8.0 07/27/2018 0956   GLUCOSEU NEGATIVE 07/27/2018 0956   HGBUR NEGATIVE 07/27/2018 0956   BILIRUBINUR NEGATIVE 07/27/2018 0956   KETONESUR NEGATIVE 07/27/2018 0956   PROTEINUR NEGATIVE 07/27/2018 0956   NITRITE NEGATIVE 07/27/2018 0956   LEUKOCYTESUR NEGATIVE 07/27/2018 0956     Kinlee Garrison M.D. [facility] 07/27/2018, 1:14 PM  Pager: [phone] Between 7am to 7pm - call Pager - [phone]  After 7pm go to [email or web address] - password [email or web address]  Call night coverage person covering after 7pm

## 2018-07-27 NOTE — Progress Notes (Signed)
Patient asked help to call his ex-wife Brandon Landry [phone]) and to informed her about his condition. Writer called patient's ex-wife and per patient's request she was informed about patient's status. Patient able to speak with her over the phone.

## 2018-07-27 NOTE — Progress Notes (Signed)
Physical Therapy Treatment Patient Details Name: Brandon Landry MRN: [id number] DOB: [birth year] Today's Date: 07/27/2018    History of Present Illness Pt is a 53 y/o male admitted secondary to symptomatic anemia. CT of abdomen revealed right colon mass with possible extension to involve the duodenum. Pt also with LUE weakness and MRI revealed numerous punctate foci of acute/early subacute infarction present throughout the right greater than left posterior cerebral hemispheres and the right cerebellar hemisphere. PMH includes depression, iron deficiency anemia, and tobacco abuse.     PT Comments    Patient seen for mobility progression. Pt is agreeable to ambulating after eating a little for lunch. Pt tolerated gait well and denies pain. Pt overall requires min guard/min A with use of AD for OOB mobility. Pt is excited to be getting [place] food brought to the hospital. Continue to progress as tolerated.     Follow Up Recommendations  SNF;Supervision/Assistance - 24 hour     Equipment Recommendations  Rolling walker with 5" wheels    Recommendations for Other Services       Precautions / Restrictions Precautions Precautions: Fall    Mobility  Bed Mobility Overal bed mobility: Needs Assistance Bed Mobility: Supine to Sit;Sit to Supine       Sit to supine: Supervision   General bed mobility comments: HOB elevated and use of rails  Transfers Overall transfer level: Needs assistance Equipment used: Rolling walker (2 wheeled) Transfers: Sit to/from Stand Sit to Stand: Min assist;From elevated surface         General transfer comment: cues for safe hand placement; pt requires assistance to power up into standing from elevated bed height   Ambulation/Gait Ambulation/Gait assistance: Min guard Gait Distance (Feet): 200 Feet Assistive device: Rolling walker (2 wheeled) Gait Pattern/deviations: Step-through pattern;Decreased stride length;Trunk flexed Gait velocity: decr    General Gait Details: min guard for safety; cues for safe use of AD and upright posture    Stairs             Wheelchair Mobility    Modified Rankin (Stroke Patients Only) Modified Rankin (Stroke Patients Only) Pre-Morbid Rankin Score: Slight disability Modified Rankin: Moderately severe disability     Balance Overall balance assessment: Needs assistance;History of Falls Sitting-balance support: No upper extremity supported;Feet supported Sitting balance-Leahy Scale: Good     Standing balance support: Bilateral upper extremity supported Standing balance-Leahy Scale: Poor                              Cognition Arousal/Alertness: Awake/alert Behavior During Therapy: WFL for tasks assessed/performed Overall Cognitive Status: Within Functional Limits for tasks assessed                                        Exercises      General Comments        Pertinent Vitals/Pain Pain Assessment: No/denies pain    Home Living                      Prior Function            PT Goals (current goals can now be found in the care plan section) Acute Rehab PT Goals Patient Stated Goal: none stated Progress towards PT goals: Progressing toward goals    Frequency    Min 3X/week  PT Plan Current plan remains appropriate    Co-evaluation              AM-PAC PT "6 Clicks" Mobility   Outcome Measure  Help needed turning from your back to your side while in a flat bed without using bedrails?: A Little Help needed moving from lying on your back to sitting on the side of a flat bed without using bedrails?: A Little Help needed moving to and from a bed to a chair (including a wheelchair)?: A Little Help needed standing up from a chair using your arms (e.g., wheelchair or bedside chair)?: A Little Help needed to walk in hospital room?: A Little Help needed climbing 3-5 steps with a railing? : A Lot 6 Click Score: 17     End of Session Equipment Utilized During Treatment: Gait belt Activity Tolerance: Patient tolerated treatment well Patient left: with call bell/phone within reach;in bed;with bed alarm set Nurse Communication: Mobility status PT Visit Diagnosis: Other abnormalities of gait and mobility (R26.89);Unsteadiness on feet (R26.81);Muscle weakness (generalized) (M62.81)     Time: 2409-7353 PT Time Calculation (min) (ACUTE ONLY): 20 min  Charges:  $Gait Training: 8-22 mins                     Earney Navy, PTA Acute Rehabilitation Services Pager: [phone] Office: [phone]     Darliss Cheney 07/27/2018, 4:30 PM

## 2018-07-28 DIAGNOSIS — F17200 Nicotine dependence, unspecified, uncomplicated: Secondary | ICD-10-CM

## 2018-07-28 DIAGNOSIS — R600 Localized edema: Secondary | ICD-10-CM

## 2018-07-28 LAB — TYPE AND SCREEN
ABO/RH(D): B POS
Antibody Screen: NEGATIVE
Unit division: 0
Unit division: 0

## 2018-07-28 LAB — BASIC METABOLIC PANEL
Anion gap: 3 — ABNORMAL LOW (ref 5–15)
BUN: 17 mg/dL (ref 6–20)
CO2: 25 mmol/L (ref 22–32)
Calcium: 7.7 mg/dL — ABNORMAL LOW (ref 8.9–10.3)
Chloride: 109 mmol/L (ref 98–111)
Creatinine, Ser: <0.3 mg/dL — ABNORMAL LOW (ref 0.61–1.24)
Glucose, Bld: 110 mg/dL — ABNORMAL HIGH (ref 70–99)
Potassium: 3.8 mmol/L (ref 3.5–5.1)
Sodium: 137 mmol/L (ref 135–145)

## 2018-07-28 LAB — URINE CULTURE: Culture: NO GROWTH

## 2018-07-28 LAB — CBC
HCT: 22.6 % — ABNORMAL LOW (ref 39.0–52.0)
Hemoglobin: 7.4 g/dL — ABNORMAL LOW (ref 13.0–17.0)
MCH: 28.9 pg (ref 26.0–34.0)
MCHC: 32.7 g/dL (ref 30.0–36.0)
MCV: 88.3 fL (ref 80.0–100.0)
Platelets: 379 10*3/uL (ref 150–400)
RBC: 2.56 MIL/uL — ABNORMAL LOW (ref 4.22–5.81)
RDW: 17.3 % — ABNORMAL HIGH (ref 11.5–15.5)
WBC: 13.7 10*3/uL — ABNORMAL HIGH (ref 4.0–10.5)
nRBC: 0 % (ref 0.0–0.2)

## 2018-07-28 LAB — BPAM RBC
Blood Product Expiration Date: 202005302359
Blood Product Expiration Date: 202006102359
ISSUE DATE / TIME: 202005241238
ISSUE DATE / TIME: 202005250448
Unit Type and Rh: 1700
Unit Type and Rh: 7300

## 2018-07-28 LAB — GLUCOSE, CAPILLARY: Glucose-Capillary: 105 mg/dL — ABNORMAL HIGH (ref 70–99)

## 2018-07-28 MED ORDER — TRAVASOL 10 % IV SOLN
INTRAVENOUS | Status: AC
  Administered 2018-07-28: 17:00:00 via INTRAVENOUS
  Filled 2018-07-28: qty 1098

## 2018-07-28 NOTE — Progress Notes (Signed)
PHARMACY - ADULT TOTAL PARENTERAL NUTRITION CONSULT NOTE   Pharmacy Consult for TPN Indication: Severe malnutrition; R-colon mass extending into duodenum   Patient Measurements: Height: 5' 8.11" (173 cm) Weight: 108 lb 0.4 oz (49 kg) IBW/kg (Calculated) : 68.65   Body mass index is 16.37 kg/m.   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 He is very malnourished and refuses to eat (only wants [place]). He is ok with TPN and needs improved nutrition if going to have surgery. Palliative care on board.   GI: Pre-albumin <5>>7.3. LBM 5/22; emesis 0 mL Endo: CBGs < 140s with TPN. Glucose has been in control; no SSI Insulin requirements in the past 24 hours: 0 units Lytes: Na 131>133>137, K 3.8, Phos 3.1 and Mag 2.1 on 5/25 Renal: SCr <0.3, BUN 17, Good UOP Pulm: no issues - RA Cards: VSS Hepatobil: LFT/Tbili/TG within normal limits.  Neuro: pain 0-2. Got high dose Thiamine, now with 100mg  daily ID: WBC 14.2>>13.7 on Zosyn. Afebrile. PCT 0.10.  TPN Access: PICC  TPN start date: 07/21/18 Nutritional Goals (per RD recommendation on 5/19): KCal: 1850-2050 Protein: 105-120g Fluid: >1.8L  Goal TPN rate is 75 ml/hr (provides 110g protein, 54g lipids, and 261g dextrose, 1867 kcal meeting 100% of needs)  Current Nutrition:  Regular diet- 0% Ensure Enlive - 0 yesterday; continues to not eat per RN  Plan:  Continue TPN at 75 mL/hr.  This TPN provides 110g protein, 54g lipids, and 261g dextrose, 1867 kcal meeting 100% of needs Electrolytes in TPN: continue increased Na more and decreased Phos and continue slightly increased Mg, others standard for now. SW:FUXNATF 1:1.  Add MVI to TPN Trace Elements on MWF only due to shortage  Continue Thiamine 100mg  po daily as oral medication. Monitor TPN labs Follow-up plan of care  Alanda Slim, PharmD, [facility] Clinical Pharmacist Please see AMION for all  Pharmacists' Contact Phone Numbers 07/28/2018, 8:10 AM

## 2018-07-28 NOTE — Progress Notes (Signed)
Nutrition Follow-up  RD working remotely.  DOCUMENTATION CODES:   Underweight, Severe malnutrition in context of chronic illness  INTERVENTION:   -D/c Prostat -ContinueMagic Cup TID with meals, each supplement provides 290 kcals and 9 grams protein -TPN management per pharmacy -Follow up for ability to transition to enteral feedings  NUTRITION DIAGNOSIS:   Severe Malnutrition related to chronic illness(colonic mass) as evidenced by energy intake < 75% for > or equal to 1 month, moderate fat depletion, severe fat depletion, moderate muscle depletion, severe muscle depletion. Ongoing  GOAL:   Patient will meet greater than or equal to 90% of their needs  Met with TPN  MONITOR:   PO intake, Supplement acceptance, Labs, Weight trends, Skin, I & O's  REASON FOR ASSESSMENT:   Consult New TPN/TNA  ASSESSMENT:   Brandon Landry is a 53 y.o. male with medical history significant of depression, iron deficiency anemia, tobacco abuse, who presents with generalized weakness.  5/18- s/p BSE-advanced to regular diet with thin liquids 5/19- PICC placed, TPN initiated 5/23- decreased to clear liquids due to abdominal pain and fullness; KUB revealed no perforation of bowel obstruction 5/24- advanced to full liquids 5/25- advanced to soft diet   Reviewed I/O's: -1.1 L x 24 hours and +763 since admission  UOP: 2 L x 24 hours  Pt intake remains variable; PO 10-75%. Pt did not like clear or full liquid diet and requested solid foods on 07/27/18. Pt only taking Prostat about 50% of time it is offered.   Pt remains dependent on TPN- currently infusing at 75 ml/hr. Regimen provides 1867 kcals and 110 grams protein, meeting 100% of estimated kcal and protein needs.  RD attempted to obtain nutrition history and discuss possible feeding tube placement with pt on 07/22/18/20, however, pt was very tangential and appeared very overwhelmed with current health status. Please review noted dated 07/22/18  for further details.While nutritional support via enteral route would be most ideal for pt(due to functioning GI tract), unsure if pt would agree to a temporary feeding tube (such as cortrak) at this time. Additionally, if pt is to go home or to SNF in the immediate future, a short term feeding tube may complicate discharge disposition, unless permanent enteral access is established (also unsure of likelihood of placing permanent access such as a PEG due to pt's medically fragile state). Due to severity of pt's malnutrition, recommend continue TPN if pt not amenable to short term feeding tube.  Palliative care continues to follow for goals of care. Pt desires to pursue surgery and treatments in [place], where he will be closer tofamily and have resources to pay for medical treatments. Per MD notes, plan to follow-up today to determine if pt is ft for transport.   Labs reviewed: K, Mg, and Phos WDL. CBGS: 105.   Diet Order:   Diet Order            DIET SOFT Room service appropriate? No; Fluid consistency: Thin  Diet effective now              EDUCATION NEEDS:   Education needs have been addressed  Skin:  Skin Assessment: Reviewed RN Assessment  Last BM:  07/28/18  Height:   Ht Readings from Last 1 Encounters:  07/24/18 5' 8.11" (1.73 m)    Weight:   Wt Readings from Last 1 Encounters:  07/28/18 49 kg    Ideal Body Weight:  70 kg  BMI:  Body mass index is 16.37 kg/m.  Estimated Nutritional  Needs:   Kcal:  1850-2050  Protein:  105-120 grams  Fluid:  > 1.8 L    Reet Scharrer A. Jimmye Norman, RD, LDN, [place] Registered Dietitian II Certified Diabetes Care and Education Specialist Pager: [phone] After hours Pager: [phone]

## 2018-07-28 NOTE — Progress Notes (Signed)
Physical Therapy Treatment Patient Details Name: Brandon Landry MRN: [id number] DOB: [birth year] Today's Date: 07/28/2018    History of Present Illness Pt is a 53 y/o male admitted secondary to symptomatic anemia. CT of abdomen revealed right colon mass with possible extension to involve the duodenum. Pt also with LUE weakness and MRI revealed numerous punctate foci of acute/early subacute infarction present throughout the right greater than left posterior cerebral hemispheres and the right cerebellar hemisphere. PMH includes depression, iron deficiency anemia, and tobacco abuse.     PT Comments    Patient seen for mobility progression. Pt tolerated increased activity well and seems to be in better spirits since visit from family yesterday. Pt requires min guard for gait with use of AD and min A for gait training without use of AD. No overt LOB. Pt will continue to benefit from skilled PT services to maximize independence and safety with mobility.     Follow Up Recommendations  SNF;Supervision/Assistance - 24 hour     Equipment Recommendations  Rolling walker with 5" wheels    Recommendations for Other Services       Precautions / Restrictions Precautions Precautions: Fall Restrictions Weight Bearing Restrictions: No    Mobility  Bed Mobility Overal bed mobility: Needs Assistance Bed Mobility: Supine to Sit;Sit to Supine       Sit to supine: Supervision   General bed mobility comments: HOB elevated and use of rails  Transfers Overall transfer level: Needs assistance Equipment used: Rolling walker (2 wheeled) Transfers: Sit to/from Stand Sit to Stand: From elevated surface;Min guard         General transfer comment: cues for safe hand placement  Ambulation/Gait Ambulation/Gait assistance: Min guard;Min assist Gait Distance (Feet): 300 Feet Assistive device: Rolling walker (2 wheeled);None(assist at trunk with gait belt) Gait Pattern/deviations: Step-through  pattern;Decreased stride length;Trunk flexed;Narrow base of support Gait velocity: decr   General Gait Details: min guard with use of AD and min A without AD; no LOB; decreased cadence and stride length; cues for upright posture   Stairs             Wheelchair Mobility    Modified Rankin (Stroke Patients Only) Modified Rankin (Stroke Patients Only) Pre-Morbid Rankin Score: Slight disability Modified Rankin: Moderately severe disability     Balance Overall balance assessment: Needs assistance;History of Falls Sitting-balance support: No upper extremity supported;Feet supported Sitting balance-Leahy Scale: Good     Standing balance support: Bilateral upper extremity supported Standing balance-Leahy Scale: Poor                              Cognition Arousal/Alertness: Awake/alert Behavior During Therapy: WFL for tasks assessed/performed Overall Cognitive Status: Within Functional Limits for tasks assessed                                        Exercises General Exercises - Lower Extremity Long Arc Quad: AROM;Both;Seated Hip Flexion/Marching: AROM;Both;Standing;Seated Toe Raises: AROM;Both;Seated Heel Raises: AROM;Both;Seated    General Comments        Pertinent Vitals/Pain Pain Assessment: No/denies pain    Home Living                      Prior Function            PT Goals (current goals can now be found in the  care plan section) Acute Rehab PT Goals Patient Stated Goal: none stated Progress towards PT goals: Progressing toward goals    Frequency    Min 3X/week      PT Plan Current plan remains appropriate    Co-evaluation              AM-PAC PT "6 Clicks" Mobility   Outcome Measure  Help needed turning from your back to your side while in a flat bed without using bedrails?: None Help needed moving from lying on your back to sitting on the side of a flat bed without using bedrails?: A  Little Help needed moving to and from a bed to a chair (including a wheelchair)?: A Little Help needed standing up from a chair using your arms (e.g., wheelchair or bedside chair)?: A Little Help needed to walk in hospital room?: A Little Help needed climbing 3-5 steps with a railing? : A Little 6 Click Score: 19    End of Session Equipment Utilized During Treatment: Gait belt Activity Tolerance: Patient tolerated treatment well Patient left: with call bell/phone within reach;in bed;with bed alarm set Nurse Communication: Mobility status PT Visit Diagnosis: Other abnormalities of gait and mobility (R26.89);Unsteadiness on feet (R26.81);Muscle weakness (generalized) (M62.81)     Time: 7681-1572 PT Time Calculation (min) (ACUTE ONLY): 20 min  Charges:  $Gait Training: 8-22 mins                     Earney Navy, PTA Acute Rehabilitation Services Pager: [phone] Office: [phone]     Darliss Cheney 07/28/2018, 10:21 AM

## 2018-07-28 NOTE — Progress Notes (Signed)
PROGRESS NOTE  Brandon Landry XTK:[id number] DOB: 1965/10/21 DOA: 07/19/2018 PCP: Patient, No Pcp Per   LOS: 9 days   Brief narrative: Patient is a77 y.o.malewho was hospitalized from 5/4 to 5/6 after he was found to have severe microcytic anemia with a hemoglobin of 3.1, he was transfused PRBC, CT scan of the abdomen on 5/4 showed a large fungating mass in the ascending colon with contained perforation and adjacent abscess formation, general surgery was subsequently consulted-surgical intervention was contemplated-however patient refused and was subsequently discharged home on 5/6. He returned [facility] on 5/16 with generalized weakness, and was found to have a hemoglobin of 3.2. He was transfused 2 units of PRBC, and transferred to [facility]. He now wants to pursue surgical options. General surgery was consulted, cannot undergo surgery due to severe malnutrition, patient refuses to eat American food.  Palliative care was consulted, currently working with patient and family regarding goals of care.  PICC line has been placed and patient has been started on TPN for nourishment. Patient now wishes to go to [place] for all of his medical care, being investigated by palliative medicine. 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  Subjective: Limited information could be obtained due to language barrier.  Patient is  able to understand few questions.  Denied abdominal pain, nausea or vomiting.  He has impaired appetite.  Assessment/Plan:  Principal Problem:   Symptomatic anemia Active Problems:   Iron deficiency anemia due to chronic blood loss   Protein-calorie malnutrition, severe (HCC)   Lower extremity edema   Tobacco use disorder   Hypokalemia   Giant mass of hepatic flexure of colon - probable perforated colon cancer   Left arm weakness   Left arm swelling   Palliative care encounter   Cerebral  embolism with cerebral infarction  Large fungating right colon mass with extension to the duodenum with aortic thrombus, probable perforation and abscess.  No fever or abdominal pain.  Repeat CT scan showed fungating mass with ascites.  General surgery has seen the patient and there is no plan for surgery at this time since the patient has refused surgical intervention here and wishes to go to [place] and furthermore he is severely malnourished.  Mild leukocytosis.  Patient is afebrile.  Patient is still on Zosyn.  We will continue with that.  Severe microcytic anemia present on admission, secondary to colon cancer.  Initial hemoglobin of 3.2.  Patient received 2 units of packed RBC at [facility] followed by 3 units of PRBC in our hospital.  Left arm weakness, bilateral strokes -MRI brain showed numerous punctate foci of acute/early subacute infarctions in the right greater than left posterior cerebral hemispheres, right cerebellar hemispheres, no enhancing metastasis. CT chest showed small bilateral pleural effusions with mural filling defect along the left right lateral wall of ascending thoracic aorta concerning for thrombus -CTA aorta showed several areas of mural thickening and intimal irregularity noted in the ascending aorta favoring infectious vegetation/thrombus -2D echo showed EF of 40 to 45% with low flow state LV recommend rule out thrombus  -Neurology following, not on antithrombotic agents given severe anemia.  Not a candidate for anticoagulation unless anemia and bleeding source is secured.  However patient has refused surgery for the last fungating colon mass.  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 Recommended palliative and hospice at this time  Left arm swelling -Doppler studies negative for DVT  Severe protein  calorie malnutrition, hypoalbuminemia Continue TNA.  Has poor appetite.  VTE Prophylaxis: SCD  Code Status: Full code  Family Communication: Palliative care has been in contact with the patient's family in [place] and [place].   Disposition Plan: -Palliative medicine following, overall poor prognosis -Unfortunately no local support due to recent divorce and immediate family in [place] and [place].  Patient will either need hospice or SNF/LTAC while awaiting disposition to [place]  -Representative from [place] has been following, patient will be transported with the medical jet to [place] when patient is deemed fit for travel.  Consultants: General surgery Palliative medicine Neurology  Procedures:  Transfusion of packed RBC  Antibiotics: Anti-infectives (From admission, onward)   Start     Dose/Rate Route Frequency Ordered Stop   07/19/18 1400  piperacillin-tazobactam (ZOSYN) IVPB 3.375 g     3.375 g 12.5 mL/hr over 240 Minutes Intravenous Every 8 hours 07/19/18 0651     07/19/18 0600  piperacillin-tazobactam (ZOSYN) IVPB 3.375 g  Status:  Discontinued     3.375 g 12.5 mL/hr over 240 Minutes Intravenous Every 8 hours 07/19/18 0442 07/19/18 0447   07/19/18 0500  piperacillin-tazobactam (ZOSYN) IVPB 3.375 g     3.375 g 100 mL/hr over 30 Minutes Intravenous  Once 07/19/18 0447 07/19/18 1445      Objective: Vitals:   07/28/18 1355 07/28/18 1731  BP: 107/80   Pulse: 90   Resp: 16   Temp: 97.7 F (36.5 C) 97.9 F (36.6 C)  SpO2: 100%     Intake/Output Summary (Last 24 hours) at 07/28/2018 1736 Last data filed at 07/28/2018 1628 Gross per 24 hour  Intake 270 ml  Output 1000 ml  Net -730 ml   Filed Weights   07/26/18 0515 07/28/18 0636 07/28/18 1213  Weight: 52.5 kg 49 kg 49 kg   Body mass index is 16.37 kg/m.   Physical Exam: GENERAL: Patient is alert awake and oriented.  Not in obvious distress.  Language barrier noted.  Cachectic HENT: No scleral  pallor or icterus. Pupils equally reactive to light. Oral mucosa is moist NECK: is supple, no palpable thyroid enlargement. CHEST: Clear to auscultation. No crackles or wheezes. Non tender on palpation. Diminished breath sounds bilaterally. CVS: S1 and S2 heard, no murmur. Regular rate and rhythm. No pericardial rub. ABDOMEN: Soft, non-tender, bowel sounds are present. No palpable hepato-splenomegaly. EXTREMITIES: No edema. CNS: Cranial nerves are intact. No focal motor or sensory deficits. SKIN: warm and dry without rashes.  Data Review: I have personally reviewed the following laboratory data and studies,  CBC: Recent Labs  Lab 07/22/18 0353  07/23/18 0431 07/26/18 0427 07/26/18 1739 07/27/18 0357 07/27/18 1108 07/28/18 0338  WBC 18.0*  --  16.4* 14.7*  --  14.2*  --  13.7*  NEUTROABS 14.9*  --   --   --   --  11.5*  --   --   HGB 6.6*   < > 8.2* 5.7* 7.4* 6.5* 7.9* 7.4*  HCT 21.0*   < > 25.6* 18.3* 22.9* 20.6* 22.9* 22.6*  MCV 84.3  --  85.6 88.0  --  88.4  --  88.3  PLT 287  --  293 347  --  343  --  379   < > = values in this interval not displayed.   Basic Metabolic Panel: Recent Labs  Lab  07/22/18 0353 07/23/18 0431 07/24/18 0359 07/25/18 0352 07/26/18 0427 07/27/18 0357 07/28/18 0338  NA 131* 132* 133* 132* 131* 133* 137  K 3.7 3.8 3.8 4.0 3.9 3.9 3.8  CL 103 104 102 104 102 105 109  CO2 23 23 24 24 23 22 25   GLUCOSE 95 107* 108* 106* 101* 104* 110*  BUN 10 13 13 15 17 17 17   CREATININE 0.40* 0.38* 0.34* 0.32* <0.30* <0.30* <0.30*  CALCIUM 7.1* 7.2* 7.2* 7.2* 7.2* 7.5* 7.7*  MG 1.8 2.1  --   --  2.0 2.1  --   PHOS 2.8 2.4*  --   --  3.0 3.1  --    Liver Function Tests: Recent Labs  Lab 07/22/18 0353 07/23/18 0431 07/27/18 0357  AST 10* 15 27  ALT 11 14 26   ALKPHOS 70 75 64  BILITOT 0.8 0.6 0.5  PROT 4.5* 5.0* 5.1*  ALBUMIN 1.2* 1.3* 1.2*   No results for input(s): LIPASE, AMYLASE in the last 168 hours. No results for input(s): AMMONIA in the  last 168 hours. Cardiac Enzymes: No results for input(s): CKTOTAL, CKMB, CKMBINDEX, TROPONINI in the last 168 hours. BNP (last 3 results) Recent Labs    07/19/18 0534  BNP 213.2*    ProBNP (last 3 results) No results for input(s): PROBNP in the last 8760 hours.  CBG: Recent Labs  Lab 07/25/18 2109 07/25/18 2329 07/26/18 0607 07/27/18 0811 07/28/18 0808  GLUCAP 111* 110* 105* 115* 105*   Recent Results (from the past 240 hour(s))  Culture, blood (Routine X 2) w Reflex to ID Panel     Status: None   Collection Time: 07/19/18  5:34 AM  Result Value Ref Range Status   Specimen Description BLOOD RIGHT ARM  Final   Special Requests   Final    BOTTLES DRAWN AEROBIC ONLY Blood Culture adequate volume   Culture   Final    NO GROWTH 5 DAYS Performed at [facility], [place] [address] [place], [place] [address]    Report Status 07/24/2018 FINAL  Final  Culture, blood (Routine X 2) w Reflex to ID Panel     Status: None   Collection Time: 07/19/18  5:34 AM  Result Value Ref Range Status   Specimen Description BLOOD LEFT ANTECUBITAL  Final   Special Requests   Final    BOTTLES DRAWN AEROBIC ONLY Blood Culture results may not be optimal due to an inadequate volume of blood received in culture bottles   Culture   Final    NO GROWTH 5 DAYS Performed at [facility], [place] [address] [place], [place] [address]    Report Status 07/24/2018 FINAL  Final  Urine Culture     Status: None   Collection Time: 07/27/18  9:49 AM  Result Value Ref Range Status   Specimen Description URINE, RANDOM  Final   Special Requests NONE  Final   Culture   Final    NO GROWTH Performed at [facility], [place] [address], [place], [place] [address]    Report Status 07/28/2018 FINAL  Final     Studies: Ct Abdomen Pelvis Wo Contrast  Result Date: 07/27/2018 CLINICAL DATA:  Initial evaluation for acute nausea, vomiting. History of colonic mass. EXAM: CT ABDOMEN AND PELVIS WITHOUT CONTRAST  TECHNIQUE: Multidetector CT imaging of the abdomen and pelvis was performed following the standard protocol without IV contrast. COMPARISON:  Recent CT from 07/20/2018 FINDINGS: Lower chest: Small to moderate layering  bilateral pleural effusions, right slightly larger than left. Associated mild bibasilar subsegmental atelectasis. Visualized lungs are otherwise clear. Hepatobiliary: 9 mm cystic lesion again noted within left hepatic lobe, indeterminate. Limited noncontrast evaluation of the liver otherwise unremarkable. Gallbladder within normal limits. No biliary dilatation. Pancreas: Pancreas within normal limits. Spleen: Spleen within normal limits. Adrenals/Urinary Tract: Adrenal glands are normal. Kidneys equal in size. Bilateral nonobstructive nephrolithiasis measuring up to 3-4 mm noted. No appreciable radiopaque calculi seen along the course of either renal collecting system. No hydronephrosis or hydroureter. No discrete renal lesions on this noncontrast examination. Bladder moderately distended. Layering hyperdensity within the posterior bladder lumen could reflect proteinaceous material and/or blood products. No stones seen layering within the bladder lumen. Stomach/Bowel: Stomach moderately distended without acute abnormality. No evidence for bowel obstruction. Previously identified large fungating necrotic mass centered at the right hemiabdomen at the level of the ascending colon again seen, measuring approximately 11.5 x 10.7 x 10.8 cm, grossly similar to previous, although better seen on prior study with IV contrast. Note again made of the tumor involving the second and third portions of the duodenum, also better seen on previous. Diffuse wall thickening of the ascending colon noted proximally. Remainder of the colon of normal caliber without acute finding. Vascular/Lymphatic: Decreased density within the cardiac blood pool suggestive of anemia. Mild aorto bi-iliac atherosclerotic disease. No aneurysm.  No visible adenopathy on this noncontrast examination. Reproductive: Prostate normal. Other: Large volume ascites throughout the abdomen and pelvis, relatively similar. No free intraperitoneal air. Musculoskeletal: No acute osseous finding. No discrete lytic or blastic osseous lesions. Diffuse anasarca noted within the external soft tissues. IMPRESSION: 1. No significant interval change in size and appearance of large necrotic fungating colonic mass centered about the ascending colon, with invasion of the second and third portions of the duodenum. No associated obstruction, pneumoperitoneum, or other interval complication. 2. Large volume ascites with small to moderate bilateral pleural effusions and diffuse anasarca, similar to previous. 3. Bilateral nonobstructive nephrolithiasis. 4. Layering hyperdensity within the posterior bladder lumen, which could reflect she proteinaceous material/sedimentation and/or blood products. Correlation with urinalysis recommended. Finding is similar to previous. Electronically Signed   By: Jeannine Boga M.D.   On: 07/27/2018 00:55    Scheduled Meds: . sodium chloride   Intravenous Once  . sodium chloride   Intravenous Once  . sodium chloride   Intravenous Once  . sodium chloride   Intravenous Once  . Chlorhexidine Gluconate Cloth  6 each Topical Q0600  . feeding supplement (PRO-STAT SUGAR FREE 64)  30 mL Oral BID  . nicotine  21 mg Transdermal Daily  . ondansetron  4 mg Oral Once  . thiamine  100 mg Oral Daily    Continuous Infusions: . sodium chloride 50 mL/hr at 07/22/18 0450  . piperacillin-tazobactam (ZOSYN)  IV 3.375 g (07/28/18 1628)  . TPN ADULT (ION) 75 mL/hr at 07/27/18 1752  . TPN ADULT (ION) 75 mL/hr at 07/28/18 [place], MD  [facility] 07/28/2018

## 2018-07-29 MED ORDER — SODIUM CHLORIDE 0.9 % IV BOLUS
1000.0000 mL | Freq: Once | INTRAVENOUS | Status: AC
  Administered 2018-07-29: 1000 mL via INTRAVENOUS

## 2018-07-29 MED ORDER — TRAVASOL 10 % IV SOLN
INTRAVENOUS | Status: AC
  Administered 2018-07-29: 18:00:00 via INTRAVENOUS
  Filled 2018-07-29: qty 1098

## 2018-07-29 NOTE — Progress Notes (Signed)
PHARMACY - ADULT TOTAL PARENTERAL NUTRITION CONSULT NOTE   Pharmacy Consult for TPN Indication: Severe malnutrition; R-colon mass extending into duodenum   Patient Measurements: Height: 5' 8.11" (173 cm) Weight: 103 lb 9.9 oz (47 kg) IBW/kg (Calculated) : 68.65   Body mass index is 15.7 kg/m.   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 He is very malnourished and refuses to eat (only wants [place]). He is ok with TPN and needs improved nutrition if going to have surgery. Palliative care on board.   GI: Pre-albumin <5>>7.3. LBM 5/26; emesis 0 mL Endo: CBGs < 140s with TPN. Glucose has been in control; no SSI Insulin requirements in the past 24 hours: 0 units Lytes: Na 131>133>137, K 3.8, Phos 3.1 and Mag 2.1 on 5/25 Renal: SCr <0.3, BUN 17, Good UOP Pulm: no issues - RA Cards: VSS Hepatobil: LFT/Tbili/TG within normal limits.  Neuro: pain 0-2. Got high dose Thiamine, now with 100mg  daily ID: WBC 14.2>>13.7 on Zosyn. Afebrile. PCT 0.10.  TPN Access: PICC  TPN start date: 07/21/18 Nutritional Goals (per RD recommendation on 5/19): KCal: 1850-2050 Protein: 105-120g Fluid: >1.8L  Goal TPN rate is 75 ml/hr (provides 110g protein, 54g lipids, and 261g dextrose, 1867 kcal meeting 100% of needs)  Current Nutrition:  Regular diet- 0% Ensure Enlive - 0 yesterday; continues to not eat per RN  Plan:  Continue TPN at 75 mL/hr.  This TPN provides 110g protein, 54g lipids, and 261g dextrose, 1867 kcal meeting 100% of needs Electrolytes in TPN: continue increased Na more and decreased Phos and continue slightly increased Mg, others standard for now. YB:FXOVANV 1:1.  Add MVI to TPN Trace Elements on MWF only due to shortage  Continue Thiamine 100mg  po daily as oral medication. Monitor TPN labs Follow-up plan of care  Alanda Slim, PharmD, [facility] Clinical Pharmacist Please see AMION for all  Pharmacists' Contact Phone Numbers 07/29/2018, 8:29 AM

## 2018-07-29 NOTE — Progress Notes (Signed)
Physical Therapy Treatment Patient Details Name: Brandon Landry MRN: [id number] DOB: 05/31/1965 Today's Date: 07/29/2018    History of Present Illness Pt is a 53 y/o male admitted secondary to symptomatic anemia. CT of abdomen revealed right colon mass with possible extension to involve the duodenum. Pt also with LUE weakness and MRI revealed numerous punctate foci of acute/early subacute infarction present throughout the right greater than left posterior cerebral hemispheres and the right cerebellar hemisphere. PMH includes depression, iron deficiency anemia, and tobacco abuse.     PT Comments    Patient received supine in bed and agreeable to mobilize with PT. Patient with mild unsteadiness throughout session with required UE support for balance.  Patient requiring up to [place] for gait with cueing throughout for safety. Will continue to follow.    Follow Up Recommendations  SNF;Supervision/Assistance - 24 hour     Equipment Recommendations  Rolling walker with 5" wheels    Recommendations for Other Services       Precautions / Restrictions Precautions Precautions: Fall Restrictions Weight Bearing Restrictions: No    Mobility  Bed Mobility Overal bed mobility: Needs Assistance Bed Mobility: Supine to Sit;Sit to Supine     Supine to sit: Supervision Sit to supine: Supervision   General bed mobility comments: HOB elevated and use of rails  Transfers Overall transfer level: Needs assistance Equipment used: Rolling walker (2 wheeled) Transfers: Sit to/from Stand Sit to Stand: Min guard;Supervision         General transfer comment: cueing for safety; reaches for sink for initial steadying; no subjective complaints  Ambulation/Gait Ambulation/Gait assistance: Min guard;Min assist Gait Distance (Feet): 300 Feet Assistive device: Rolling walker (2 wheeled) Gait Pattern/deviations: Step-through pattern;Decreased stride length;Trunk flexed;Narrow base of support Gait  velocity: decreased   General Gait Details: min guard to Min A for functional mobility in hallway with RW; mild unsteadiness; cueing for safety with device and to remain close    Stairs             Wheelchair Mobility    Modified Rankin (Stroke Patients Only) Modified Rankin (Stroke Patients Only) Pre-Morbid Rankin Score: Slight disability Modified Rankin: Moderately severe disability     Balance Overall balance assessment: Needs assistance;History of Falls Sitting-balance support: No upper extremity supported;Feet supported Sitting balance-Leahy Scale: Good     Standing balance support: Bilateral upper extremity supported;During functional activity Standing balance-Leahy Scale: Poor                              Cognition Arousal/Alertness: Awake/alert Behavior During Therapy: WFL for tasks assessed/performed Overall Cognitive Status: Within Functional Limits for tasks assessed                           Safety/Judgement: Decreased awareness of safety     General Comments: Pt following commands today. Pt is clearly depressed about having no one here.      Exercises      General Comments        Pertinent Vitals/Pain Pain Assessment: No/denies pain    Home Living                      Prior Function            PT Goals (current goals can now be found in the care plan section) Acute Rehab PT Goals Patient Stated Goal: none stated PT Goal Formulation:  With patient Time For Goal Achievement: 08/03/18 Potential to Achieve Goals: Good Progress towards PT goals: Progressing toward goals    Frequency    Min 3X/week      PT Plan Current plan remains appropriate    Co-evaluation              AM-PAC PT "6 Clicks" Mobility   Outcome Measure  Help needed turning from your back to your side while in a flat bed without using bedrails?: None Help needed moving from lying on your back to sitting on the side of a  flat bed without using bedrails?: A Little Help needed moving to and from a bed to a chair (including a wheelchair)?: A Little Help needed standing up from a chair using your arms (e.g., wheelchair or bedside chair)?: A Little Help needed to walk in hospital room?: A Little Help needed climbing 3-5 steps with a railing? : A Little 6 Click Score: 19    End of Session Equipment Utilized During Treatment: Gait belt Activity Tolerance: Patient tolerated treatment well Patient left: in bed;with call bell/phone within reach Nurse Communication: Mobility status PT Visit Diagnosis: Other abnormalities of gait and mobility (R26.89);Unsteadiness on feet (R26.81);Muscle weakness (generalized) (M62.81)     Time: 6503-5465 PT Time Calculation (min) (ACUTE ONLY): 12 min  Charges:  $Gait Training: 8-22 mins                      Lanney Gins, PT, DPT Supplemental Physical Therapist 07/29/18 2:19 PM Pager: [phone] Office: [phone]

## 2018-07-29 NOTE — Progress Notes (Signed)
Palliative Care Follow-up Reason: Care Coordination  Brandon Landry remains hospitalized and in very guarded and fragile condition. He is difficult to communicate with even with a translator which he often refuses and there have been questions about his capacity. He is severely malnoyrished receiving TPN nutrition. He is not a surgical candidate and not a candidate for anticoagulation given his continued unstable Hb with admission Hb of 3.2 and high risk of bleeding from colonic mass. Most concerning is the aortic mass/thrombus, embolic strokes and the findings of walled off perforation of his colonic mass.  He is both refusing and not a candidate for surgical management or other oncology interventions so it would be most appropriate for him to have a DNR order in the event of rapid unexpected decline.  In terms of his discharge planning and future care plan-his prognosis is very poor he is at high risk for sudden death and a generous estimate of his survival would be <3 months, likely much less as in a few weeks.  He is uninsured, he has an active APS case, poor living conditions outside of the hospital. His family is now involved and they are in [place] and in [place] are trying to arrange for medical transport for him back to [place].   I have been asked to complete documentation for his transport and to assist in this care coordination. We have been contacted multiple times by [place] and by family to make this happen. I have significant concerns about his ability to tolerate an international flight and also reservations about many of the requests being made by family and the service they are using to facilitate this request.  Under the following conditions I will complete paperwork for Brandon Landry: 1. He must have a DNR order 2. The patient and family both must provide verbal or written confirmation that they understand that death may occur during the flight and that the benefit of  getting him back to [place] outweighs this risk. 3. I will not assume any liability for events related to his travel. 4. APS worker must approve this plan  Once I have completed this paperwork and received approval from Risk Management I will notify CSW.  Lane Hacker, DO Palliative Medicine [phone]

## 2018-07-29 NOTE — Progress Notes (Signed)
Completed paperwork for patient to travel to [place], accompanied by a medical transport assist provided by the family. They understand the risk of travel including death and still want to proceed with travel to [place]. I approved this and completed paperwork as a compassionate attempt to reunite him with his only family. Copy of paperwork emailed to CSW and ([email or web address]) Alvy Beal after obtaining consent from the patient.   Time: 35 min Greater than 50%  of this time was spent counseling and coordinating care related to the above assessment and plan.  Lane Hacker, DO Palliative Medicine

## 2018-07-29 NOTE — Progress Notes (Signed)
PROGRESS NOTE    Brandon Landry  IHK:[id number] DOB: September 18, 1965 DOA: 07/19/2018 PCP: Patient, No Pcp Per    Brief Narrative:   Patient is a32 y.o.malewho was hospitalized from 5/4 to 5/6 after he was found to have severe microcytic anemia with a hemoglobin of 3.1, he was transfused PRBC, CT scan of the abdomen on 5/4 showed a large fungating mass in the ascending colon with contained perforation and adjacent abscess formation, general surgery was subsequently consulted-surgical intervention was contemplated-however patient refused and was subsequently discharged home on 5/6. He returned [facility] on 5/16 with generalized weakness, and was found to have a hemoglobin of3.2.He was transfused 2 units of PRBC, and transferred to [facility]. He now wants to pursue surgical options. General surgery was consulted, cannot undergo surgery due to severe malnutrition, patient refuses to eat American food. Palliative care was consulted, currently working with patient and family regarding goals of care. PICC line has been placed and patient has been started on TPN for nourishment. Patient now wishes to go to [place] for all of his medical care, being investigated by palliative medicine. 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 No fever or abdominal pain. Repeat CT scan showed fungating mass with ascites.  General surgery has seen the patient and there is no plan for surgery at this time since the patient has refused surgical intervention here and wishes to go to [place], and furthermore he is severely malnourished.   --Continues with mild leukocytosis, WBC count 13.7. --empiric antibiotics with Zosyn --Supportive care --Palliative care assisting with possible transfer --Grim prognosis especially in the setting of refusal of interventions  Severe microcytic anemia, present on admission Etiology likely secondary to colon cancer.  Initial hemoglobin of 3.2.  Patient received 2 units of packed RBC at [facility] followed by 3 units of PRBC in our hospital. --Hgb 7.9-->7.4 --Continue monitor hemoglobin daily, will transfuse for hemoglobin less than 7.0  Left arm weakness, bilateral strokes MRI brain showed numerous punctate foci of acute/early subacute infarctions in the right greater than left posterior cerebral hemispheres, right cerebellar hemispheres, no enhancing metastasis. CT chest showed small bilateral pleural effusions with mural filling defect along the left right lateral wall of ascending thoracic aorta concerning for thrombus. CTA aorta showed several areas of mural thickening and intimal irregularity noted in the ascending aorta favoring infectious vegetation/thrombus. 2D echo showed EF of 40 to 45% with low flow state LV, recommend rule out thrombus.  -Neurology following, not on antithrombotic agents given severe anemia. Not a candidate for anticoagulation unless anemia and bleeding source is secured. However patient has refused surgery for the fungating colon mass.  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 Recommended palliative and hospice at this time.  Left arm swelling Doppler studies negative for DVT  Severe protein calorie malnutrition, hypoalbuminemia Continue TNA.  Has poor appetite.    DVT prophylaxis: SCDs Code Status: Full code Family Communication: Health care continues to follow with patient's family located in [place] and [place]. Disposition Plan:  Palliative medicine following, overall poor prognosis. Unfortunately no local support due to recent divorce and immediate family in [place] and [place]. Representative from [place] has been following, patient will be transported with the medical jet to [place] when patient is deemed fit for travel.  There are significant concerns that patient may not be able to tolerate international flight due to his severe illness and palliative care will only complete required documents if his code status was updated to DNR, the patient and family both provide verbal or written confirmation that they understand the risk of death versus benefit of transport to [place], with approval of [place] worker.   Consultants:   General surgery  Palliative care  Neurology  Procedures:   none  Antimicrobials:  Zosyn 5/17>>>   Subjective: Patient seen and examined at bedside, nursing present.  Severely debilitated/cachectic in appearance.  Refuses translator.  Does seem to be able to answer questions somewhat appropriately.  Requests for expedited transport to [place] for further medical care. States " [place] has Materials engineer".  Continues to refuse any aggressive interventions here to include surgery.  Objective: Vitals:   07/28/18 1731 07/28/18 2219 07/29/18 0500 07/29/18 0531  BP:  133/70  102/73  Pulse:  93  85  Resp:  15  16  Temp: 97.9 F (36.6 C) 98.2 F (36.8 C)  98.4 F (36.9 C)  TempSrc: Oral Oral  Oral  SpO2:  100%  100%  Weight:   47 kg   Height:        Intake/Output Summary (Last 24 hours) at  07/29/2018 1308 Last data filed at 07/29/2018 1200 Gross per 24 hour  Intake 50 ml  Output 301 ml  Net -251 ml   Filed Weights   07/28/18 0636 07/28/18 1213 07/29/18 0500  Weight: 49 kg 49 kg 47 kg    Examination:  General exam: Appears calm and comfortable, cachectic in appearance, language barrier but able to answer simple questions appropriately Respiratory system: Clear to auscultation. Respiratory effort normal. Cardiovascular system: S1 & S2 heard, RRR. No JVD, murmurs, rubs, gallops or clicks. No pedal edema. Gastrointestinal system: Abdomen is nondistended, soft and nontender. No organomegaly or masses felt. Normal bowel sounds heard. Central nervous system: Alert and oriented. No focal neurological deficits. Extremities: No peripheral edema Skin: No rashes, lesions or ulcers Psychiatry: Judgement and insight appear normal. Mood & affect appropriate.     Data Reviewed: I have personally reviewed following labs and imaging studies  CBC: Recent Labs  Lab 07/23/18 0431 07/26/18 0427 07/26/18 1739 07/27/18 0357 07/27/18 1108 07/28/18 0338  WBC 16.4* 14.7*  --  14.2*  --  13.7*  NEUTROABS  --   --   --  11.5*  --   --   HGB 8.2* 5.7* 7.4* 6.5* 7.9* 7.4*  HCT 25.6* 18.3* 22.9* 20.6* 22.9* 22.6*  MCV 85.6 88.0  --  88.4  --  88.3  PLT 293 347  --  343  --  426   Basic Metabolic Panel: Recent Labs  Lab 07/23/18 0431 07/24/18 0359 07/25/18 0352 07/26/18 0427 07/27/18  1610 07/28/18 0338  NA 132* 133* 132* 131* 133* 137  K 3.8 3.8 4.0 3.9 3.9 3.8  CL 104 102 104 102 105 109  CO2 23 24 24 23 22 25   GLUCOSE 107* 108* 106* 101* 104* 110*  BUN 13 13 15 17 17 17   CREATININE 0.38* 0.34* 0.32* <0.30* <0.30* <0.30*  CALCIUM 7.2* 7.2* 7.2* 7.2* 7.5* 7.7*  MG 2.1  --   --  2.0 2.1  --   PHOS 2.4*  --   --  3.0 3.1  --    GFR: CrCl cannot be calculated (This lab value cannot be used to calculate CrCl because it is not a number: <0.30). Liver Function Tests: Recent  Labs  Lab 07/23/18 0431 07/27/18 0357  AST 15 27  ALT 14 26  ALKPHOS 75 64  BILITOT 0.6 0.5  PROT 5.0* 5.1*  ALBUMIN 1.3* 1.2*   No results for input(s): LIPASE, AMYLASE in the last 168 hours. No results for input(s): AMMONIA in the last 168 hours. Coagulation Profile: No results for input(s): INR, PROTIME in the last 168 hours. Cardiac Enzymes: No results for input(s): CKTOTAL, CKMB, CKMBINDEX, TROPONINI in the last 168 hours. BNP (last 3 results) No results for input(s): PROBNP in the last 8760 hours. HbA1C: No results for input(s): HGBA1C in the last 72 hours. CBG: Recent Labs  Lab 07/25/18 2109 07/25/18 2329 07/26/18 0607 07/27/18 0811 07/28/18 0808  GLUCAP 111* 110* 105* 115* 105*   Lipid Profile: Recent Labs    07/27/18 0357  TRIG 63   Thyroid Function Tests: No results for input(s): TSH, T4TOTAL, FREET4, T3FREE, THYROIDAB in the last 72 hours. Anemia Panel: No results for input(s): VITAMINB12, FOLATE, FERRITIN, TIBC, IRON, RETICCTPCT in the last 72 hours. Sepsis Labs: Recent Labs  Lab 07/23/18 0431  PROCALCITON 0.11    Recent Results (from the past 240 hour(s))  Urine Culture     Status: None   Collection Time: 07/27/18  9:49 AM  Result Value Ref Range Status   Specimen Description URINE, RANDOM  Final   Special Requests NONE  Final   Culture   Final    NO GROWTH Performed at [facility], [address], [place], [place] [address]    Report Status 07/28/2018 FINAL  Final         Radiology Studies: No results found.      Scheduled Meds: . Chlorhexidine Gluconate Cloth  6 each Topical Q0600  . feeding supplement (PRO-STAT SUGAR FREE 64)  30 mL Oral BID  . nicotine  21 mg Transdermal Daily  . thiamine  100 mg Oral Daily   Continuous Infusions: . sodium chloride 50 mL/hr at 07/22/18 0450  . piperacillin-tazobactam (ZOSYN)  IV 3.375 g (07/29/18 0905)  . TPN ADULT (ION) 75 mL/hr at 07/28/18 1715  . TPN ADULT (ION)       LOS:  10 days    Time spent: 31 minutes    Eric J [place] DO [facility] Pager [phone]  If 7PM-7AM, please contact night-coverage [email or web address] Password [email or web address] 07/29/2018, 1:08 PM

## 2018-07-29 NOTE — Progress Notes (Signed)
Pt's BP check twice. 78/65 and 87/54; Md notified. Orders received for 1000L Ns bolus. Will continue to monitor.

## 2018-07-29 NOTE — Progress Notes (Signed)
Occupational Therapy Treatment Patient Details Name: Brandon Landry MRN: [id number] DOB: 1965-10-19 Today's Date: 07/29/2018    History of present illness Pt is a 53 y/o male admitted secondary to symptomatic anemia. CT of abdomen revealed right colon mass with possible extension to involve the duodenum. Pt also with LUE weakness and MRI revealed numerous punctate foci of acute/early subacute infarction present throughout the right greater than left posterior cerebral hemispheres and the right cerebellar hemisphere. PMH includes depression, iron deficiency anemia, and tobacco abuse.    OT comments  Pt sad throughout session stating he wants to go home to [place]. Pt ambulatory in hallway and room with IV pole and minguardA for support. Pt refusing ADL tasks and would not perform BUE HEP. Pt education provided on use of call bell in room as pt was found in hallway; and education for BUE HEP. No edema noted in LUE. Pt increasing suse of LUE to fair coordination. Pt continues to progress and requires OT skilled services for ADL and HEP progression. OT following acutely.    Follow Up Recommendations  SNF;Supervision/Assistance - 24 hour    Equipment Recommendations  3 in 1 bedside commode    Recommendations for Other Services      Precautions / Restrictions Precautions Precautions: Fall Restrictions Weight Bearing Restrictions: No       Mobility Bed Mobility Overal bed mobility: Needs Assistance Bed Mobility: Sit to Supine     Supine to sit: Supervision Sit to supine: Supervision   General bed mobility comments: HOB elevated and use of rails  Transfers Overall transfer level: Needs assistance Equipment used: Rolling walker (2 wheeled) Transfers: Sit to/from Stand Sit to Stand: Min guard;Supervision         General transfer comment: Pt using IV pole for stability    Balance Overall balance assessment: Needs assistance;History of Falls Sitting-balance support: No upper  extremity supported;Feet supported Sitting balance-Leahy Scale: Good     Standing balance support: Bilateral upper extremity supported;During functional activity Standing balance-Leahy Scale: Fair                             ADL either performed or assessed with clinical judgement   ADL Overall ADL's : Needs assistance/impaired                                     Functional mobility during ADLs: Min guard;Supervision/safety;Rolling walker General ADL Comments: Pt expresses concern about not being able to go to [place]. Pt appears depressed and not wanting to perform any ADL task. Pt ambulating and transferring well.     Vision   Vision Assessment?: No apparent visual deficits   Perception     Praxis      Cognition Arousal/Alertness: Awake/alert Behavior During Therapy: WFL for tasks assessed/performed Overall Cognitive Status: Within Functional Limits for tasks assessed                           Safety/Judgement: Decreased awareness of safety     General Comments: Pt following commands today. Pt is clearly depressed about having no one here.        Exercises     Shoulder Instructions       General Comments Pt stating "I want to call the police. I want to go to [place]."    Pertinent Vitals/ Pain  Pain Assessment: No/denies pain  Home Living                                          Prior Functioning/Environment              Frequency  Min 2X/week        Progress Toward Goals  OT Goals(current goals can now be found in the care plan section)  Progress towards OT goals: Progressing toward goals  Acute Rehab OT Goals Patient Stated Goal: none stated OT Goal Formulation: With patient Time For Goal Achievement: 08/03/18 Potential to Achieve Goals: Fair ADL Goals Pt Will Perform Eating: Independently;sitting Pt Will Perform Grooming: with min guard assist;standing Pt Will Perform Upper  Body Dressing: with set-up;with supervision;sitting Pt Will Perform Lower Body Dressing: with modified independence;sit to/from stand Pt Will Transfer to Toilet: with min guard assist;ambulating;bedside commode Pt Will Perform Toileting - Clothing Manipulation and hygiene: with min assist;sit to/from stand Pt Will Perform Tub/Shower Transfer: Tub transfer;3 in 1;ambulating;with modified independence;rolling walker Pt/caregiver will Perform Home Exercise Program: Left upper extremity;With minimal assist  Plan Discharge plan remains appropriate    Co-evaluation                 AM-PAC OT "6 Clicks" Daily Activity     Outcome Measure   Help from another person eating meals?: None Help from another person taking care of personal grooming?: A Little Help from another person toileting, which includes using toliet, bedpan, or urinal?: A Little Help from another person bathing (including washing, rinsing, drying)?: A Little Help from another person to put on and taking off regular upper body clothing?: A Little Help from another person to put on and taking off regular lower body clothing?: A Little 6 Click Score: 19    End of Session Equipment Utilized During Treatment: Gait belt;Rolling walker  OT Visit Diagnosis: Other abnormalities of gait and mobility (R26.89);Muscle weakness (generalized) (M62.81);Other symptoms and signs involving cognitive function;History of falling (Z91.81);Unsteadiness on feet (R26.81)   Activity Tolerance Patient tolerated treatment well   Patient Left in bed;with call bell/phone within reach;with bed alarm set   Nurse Communication Mobility status        Time: 0946-1000 OT Time Calculation (min): 14 min  Charges: OT General Charges $OT Visit: 1 Visit OT Treatments $Therapeutic Activity: 8-22 mins  Brandon Landry) Brandon Landry OTR/L Acute Rehabilitation Services Pager: [phone] Office: [phone]    Brandon Landry 07/29/2018, 3:11  PM

## 2018-07-30 LAB — COMPREHENSIVE METABOLIC PANEL
ALT: 35 U/L (ref 0–44)
AST: 33 U/L (ref 15–41)
Albumin: 1.3 g/dL — ABNORMAL LOW (ref 3.5–5.0)
Alkaline Phosphatase: 66 U/L (ref 38–126)
Anion gap: 6 (ref 5–15)
BUN: 23 mg/dL — ABNORMAL HIGH (ref 6–20)
CO2: 22 mmol/L (ref 22–32)
Calcium: 7.5 mg/dL — ABNORMAL LOW (ref 8.9–10.3)
Chloride: 112 mmol/L — ABNORMAL HIGH (ref 98–111)
Creatinine, Ser: 0.4 mg/dL — ABNORMAL LOW (ref 0.61–1.24)
GFR calc Af Amer: >60 mL/min (ref >60)
GFR calc non Af Amer: >60 mL/min (ref >60)
Glucose, Bld: 107 mg/dL — ABNORMAL HIGH (ref 70–99)
Potassium: 3.8 mmol/L (ref 3.5–5.1)
Sodium: 140 mmol/L (ref 135–145)
Total Bilirubin: 0.3 mg/dL (ref 0.3–1.2)
Total Protein: 4.6 g/dL — ABNORMAL LOW (ref 6.5–8.1)

## 2018-07-30 LAB — CBC
HCT: 12.9 % — ABNORMAL LOW (ref 39.0–52.0)
Hemoglobin: 4 g/dL — CL (ref 13.0–17.0)
MCH: 29 pg (ref 26.0–34.0)
MCHC: 31 g/dL (ref 30.0–36.0)
MCV: 93.5 fL (ref 80.0–100.0)
Platelets: 330 10*3/uL (ref 150–400)
RBC: 1.38 MIL/uL — ABNORMAL LOW (ref 4.22–5.81)
RDW: 17.5 % — ABNORMAL HIGH (ref 11.5–15.5)
WBC: 13.8 10*3/uL — ABNORMAL HIGH (ref 4.0–10.5)
nRBC: 0 % (ref 0.0–0.2)

## 2018-07-30 LAB — PREPARE RBC (CROSSMATCH)

## 2018-07-30 LAB — MAGNESIUM: Magnesium: 2 mg/dL (ref 1.7–2.4)

## 2018-07-30 LAB — GLUCOSE, CAPILLARY: Glucose-Capillary: 106 mg/dL — ABNORMAL HIGH (ref 70–99)

## 2018-07-30 LAB — PHOSPHORUS: Phosphorus: 3.1 mg/dL (ref 2.5–4.6)

## 2018-07-30 MED ORDER — TRAVASOL 10 % IV SOLN
INTRAVENOUS | Status: AC
  Administered 2018-07-30: 18:00:00 via INTRAVENOUS
  Filled 2018-07-30: qty 1098

## 2018-07-30 MED ORDER — PIPERACILLIN-TAZOBACTAM 3.375 G IVPB
3.3750 g | Freq: Three times a day (TID) | INTRAVENOUS | Status: DC
  Administered 2018-07-31 – 2018-08-06 (×21): 3.375 g via INTRAVENOUS
  Filled 2018-07-30 (×18): qty 50

## 2018-07-30 MED ORDER — PIPERACILLIN-TAZOBACTAM 3.375 G IVPB 30 MIN
3.3750 g | Freq: Once | INTRAVENOUS | Status: AC
  Administered 2018-07-30: 3.375 g via INTRAVENOUS
  Filled 2018-07-30: qty 50

## 2018-07-30 MED ORDER — SODIUM CHLORIDE 0.9% IV SOLUTION
Freq: Once | INTRAVENOUS | Status: AC
  Administered 2018-07-30: 12:00:00 via INTRAVENOUS

## 2018-07-30 NOTE — Progress Notes (Signed)
PHARMACY - ADULT TOTAL PARENTERAL NUTRITION CONSULT NOTE   Pharmacy Consult for TPN Indication: Severe malnutrition; R-colon mass extending into duodenum   Patient Measurements: Height: 5' 8.11" (173 cm) Weight: 101 lb 10.1 oz (46.1 kg) IBW/kg (Calculated) : 68.65   Body mass index is 15.4 kg/m.   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 He is very malnourished and refuses to eat (only wants [place]). He is ok with TPN and needs improved nutrition if going to have surgery. Palliative care on board.   GI: Pre-albumin <5>>7.3. LBM 5/27; emesis 0 mL Endo: CBGs < 140s with TPN. Glucose has been in control; no SSI Insulin requirements in the past 24 hours: 0 units Lytes: Na 137>>140, K 3.8, Phos 3.1 and Mag 2. CoCa 9.7 (alb 1.3) Renal: SCr 0.4, BUN 23, Good UOP Pulm: no issues - RA Cards: VSS Hepatobil: LFT/Tbili/TG within normal limits.  Neuro: pain 0-2. Got high dose Thiamine, now with 100mg  daily ID: WBC 13.8 (unchanged) on Zosyn. Afebrile. PCT 0.10.  TPN Access: PICC  TPN start date: 07/21/18 Nutritional Goals (per RD recommendation on 5/26): KCal: 1850-2050 Protein: 105-120g Fluid: >1.8L  Goal TPN rate is 75 ml/hr (provides 110g protein, 54g lipids, and 261g dextrose, 1867 kcal meeting 100% of needs)  Current Nutrition:  Regular diet- 0% Prostat - 0  Plan:  Continue TPN at 75 mL/hr.  This TPN provides 110g protein, 54g lipids, and 261g dextrose, 1867 kcal meeting 100% of needs Electrolytes in TPN: continue increased Na more and decreased Phos and continue slightly increased Mg, others standard for now. OZ:HYQMVHQ 1:1.  Add MVI to TPN Add Thiamine to TPN and discontinue oral order. Trace Elements on MWF only due to shortage Monitor TPN labs Follow-up plan of care  Sloan Leiter, PharmD, BCPS, BCCCP Clinical Pharmacist Please refer to [facility] for [place] numbers 07/30/2018,  7:33 AM

## 2018-07-30 NOTE — Progress Notes (Signed)
Paperwork completed after discussion with the patient re: his EOL terminal care wishes. I also discussed with his nephew who is the primary contact helping to arrange medical transport to [facility] both patient and his nephew on the phone in his room.  He is high risk travel but risks do not outweigh benefits for the patient and family. Details of transport should be determined by family and the company they are working with to provide the medical escort.   Once travel plans are determined-Would discharge patient with PICC line in place. Stop TPN for travel, May send IV fluids for administration, supplemental O2 provided by the airline, if possible for him to take an oral antibiotic this would be preferred for travel. Otherwise his care can be continued when he arrives in [place].  No additional recommendations-he has not requires significant doses of pain control or opioids during this admission.  Lane Hacker, DO Palliative Medicine

## 2018-07-30 NOTE — Progress Notes (Signed)
Per Smita DD, the x-wife of this patient has permission to come to see this patient.

## 2018-07-30 NOTE — Progress Notes (Addendum)
CRITICAL VALUE ALERT  Critical Value:  Hgb = 4.0 called by lab at 0645 Patient not in distress, VSS.  Date & Time Notied:  07/30/2018; 0653  Provider Notified: Lamar Blinks  Orders Received/Actions taken: Awaiting orders.  Endorsed to incoming day shift RN appropriately.

## 2018-07-30 NOTE — Progress Notes (Addendum)
Nutrition Follow-up  DOCUMENTATION CODES:   Underweight, Severe malnutrition in context of chronic illness  INTERVENTION:   -D/c 30 ml Prostat BID, due to poor acceptance -D/c Magic Cup BID, due to poor acceptance -TPN management per pharmacy -Follow-up for ability to transition to enteral feedings  NUTRITION DIAGNOSIS:   Severe Malnutrition related to chronic illness(colonic mass) as evidenced by energy intake < 75% for > or equal to 1 month, moderate fat depletion, severe fat depletion, moderate muscle depletion, severe muscle depletion.  Ongoing  GOAL:   Patient will meet greater than or equal to 90% of their needs  Progressing  MONITOR:   PO intake, Supplement acceptance, Labs, Weight trends, Skin, I & O's  REASON FOR ASSESSMENT:   Consult New TPN/TNA  ASSESSMENT:   Brandon Landry is a 53 y.o. male with medical history significant of depression, iron deficiency anemia, tobacco abuse, who presents with generalized weakness.  5/18- s/p BSE-advanced to regular diet with thin liquids 5/19- PICC placed, TPN initiated 5/23- decreased to clear liquids due to abdominal pain and fullness; KUB revealed no perforation of bowel obstruction 5/24- advanced to full liquids 5/25- advanced to soft diet   Reviewed I/O's: +9.9 L x 24 hours and +10.8 L since admission  UOP: 1.3 L x 24 hours  Pt resting quietly at time of visit. RD did not disturb. Noted breakfast meal tray was untouched. Pt now refusing Prostat supplements and often refusing meal as well.   Reviewed wts from this admission; noted that pt has experienced a 12.4% wt loss over the past week, which is significant for time frame. RD adjusted needs secondary to weight loss  Pt remains on TPN; infusing via PICC at 75 ml/hr, which provides 1867 kcals and 110 grams protein, meeting 83% of estimated kcal needs and 96% of estimated protein needs.   RD attempted to obtain nutrition history and discuss possible feeding tube  placement with pton 07/22/18/20, however, pt was very tangential and appeared very overwhelmed with current health status. Please review noted dated 07/22/18 for further details.While nutritional support via enteral route would be most ideal for pt(due to functioning GI tract), unsure if pt would agree to a temporary feeding tube (such as cortrak) at this time. Additionally, if pt is to go home or to SNF in the immediate future, a short term feeding tube may complicate discharge disposition, unless permanent enteral access is established (also unsure of likelihood of placing permanent access such as a PEG due to pt's medically fragile state). Due to severity of pt's malnutrition, recommend continue TPN if pt not amenable to short term feeding tube.  Palliative care team following; EOL terminal care wishes paperwork completed today. Pt still desires to pursue medical transport to [place], where he has more family and resources to pursue treatment. Family has been working to set-up medical transport to [place]. Pt remains at high travel risk.   Labs reviewed: CBGS: 105-106.  Diet Order:   Diet Order            DIET SOFT Room service appropriate? No; Fluid consistency: Thin  Diet effective now              EDUCATION NEEDS:   Education needs have been addressed  Skin:  Skin Assessment: Reviewed RN Assessment  Last BM:  07/28/18  Height:   Ht Readings from Last 1 Encounters:  07/24/18 5' 8.11" (1.73 m)    Weight:   Wt Readings from Last 1 Encounters:  07/30/18 46.1 kg  Ideal Body Weight:  70 kg  BMI:  Body mass index is 15.4 kg/m.  Estimated Nutritional Needs:   Kcal:  1850-2050  Protein:  105-120 grams  Fluid:  > 1.8 L    Ho Parisi A. Jimmye Norman, RD, LDN, [place] Registered Dietitian II Certified Diabetes Care and Education Specialist Pager: [phone] After hours Pager: [phone]

## 2018-07-30 NOTE — TOC Progression Note (Signed)
Transition of Care [facility]) - Progression Note    Patient Details  Name: Brandon Landry MRN: [id number] Date of Birth: 1966/02/09  Transition of Care [facility]) CM/SW [place], LCSW Phone Number: 07/30/2018, 9:24 AM  Clinical Narrative:    CSW spoke with patient's APS worker, [name], and explained the current situation. He reported that it is up to the hospital on if we are able to assist getting him to [place]. He reports that they believe patient has the capacity to make decisions and APS is planning on signing off on him because patient refuses everything. APS reports understanding that the hospital has to check liability concerns but asks to be updated on the final decision.      Expected Discharge Plan: [place] Barriers to Discharge: Continued Medical Work up  Expected Discharge Plan and Services Expected Discharge Plan: [place] In-house Referral: Clinical Social Work, Hospice / [place] arrangements for the past 2 months: Apartment                 DME Arranged: N/A DME Agency: NA       HH Arranged: NA HH Agency: NA         Social Determinants of Health (SDOH) Interventions    Readmission Risk Interventions Readmission Risk Prevention Plan 07/07/2018  Transportation Screening Complete

## 2018-07-30 NOTE — Progress Notes (Signed)
PROGRESS NOTE    Brandon Landry  OXB:[id number] DOB: 1965/10/27 DOA: 07/19/2018 PCP: Patient, No Pcp Per    Brief Narrative:   Patient is a58 y.o.malewho was hospitalized from 5/4 to 5/6 after he was found to have severe microcytic anemia with a hemoglobin of 3.1, he was transfused PRBC, CT scan of the abdomen on 5/4 showed a large fungating mass in the ascending colon with contained perforation and adjacent abscess formation, general surgery was subsequently consulted-surgical intervention was contemplated-however patient refused and was subsequently discharged home on 5/6. He returned [facility] on 5/16 with generalized weakness, and was found to have a hemoglobin of3.2.He was transfused 2 units of PRBC, and transferred to [facility]. He now wants to pursue surgical options.  General surgery was consulted, cannot undergo surgery due to severe malnutrition, patient refuses to eat American food. Palliative care was consulted, currently working with patient and family regarding goals of care. PICC line has been placed and patient has been started on TPN for nourishment. Patient now wishes to go to [place] for all of his medical care, being investigated by palliative medicine. 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 No fever or abdominal pain. Repeat CT scan showed fungating mass with ascites. General surgery has seen the patient and there is no plan for surgery at this time since the patient has refused surgical intervention here and wishes to go to [place], and furthermore he is severely malnourished.   --Continues with mild leukocytosis, WBC count 13.8. --continue empiric antibiotics with Zosyn --Palliative care assisting with possible transfer, may leave AMA per patient and ex-wife today --Grim prognosis especially in the setting of refusal of interventions --supporitive care  Severe microcytic anemia, present on admission Etiology likely secondary to colon cancer.  Initial hemoglobin of 3.2.  Patient received 2 units of packed RBC at [facility] followed by 3 units of PRBC in our hospital. --Hgb 7.9-->7.4-->4.0 today --Transfuse 3 units PRBCs today --Continue monitor hemoglobin daily, will transfuse for hemoglobin less than 7.0  Left arm weakness, bilateral strokes MRI brain showed numerous punctate foci of acute/early subacute infarctions in the right greater than left posterior cerebral hemispheres, right cerebellar hemispheres, no enhancing metastasis. CT chest showed small bilateral pleural effusions with mural filling defect along the left right lateral wall of ascending thoracic aorta concerning for thrombus. CTA aorta showed several areas of mural thickening and intimal irregularity noted in the ascending aorta favoring infectious vegetation/thrombus. 2D echo showed EF of 40 to 45% with low flow state LV, recommend rule out thrombus.  -Neurology following, not on antithrombotic agents given severe anemia. Not a candidate for anticoagulation unless anemia and bleeding source is secured. However patient has refused surgery for the fungating colon mass.  Ascending  aorta vegetation/thrombus Likely related to advanced metastatic  colon cancer, not a candidate for anticoagulation due to severe anemia and refusal for the surgery or further management. Recommended palliative and hospice at this time.  Left arm swelling Doppler studies negative for DVT  Severe protein calorie malnutrition, hypoalbuminemia Continue TNA.  Has poor appetite.  Ethics Ex-wife present at bedside this morning.  She is frustrated that we have not transferred him on a "[place] medical jet" as of yet.  She states that this should have been done last Tuesday.  When questioned patient's ex-wife regarding where this "medical jet" is currently; she has no idea on its arrival.  Case management/social work has been discussing with patient's other family, Kipp Laurence in [place]; and apparently the family's plan was to put him on a commercial airliner from [place] to [place] with connection from [place] to [place].  Given patient's frailty, inability to ambulate, there is severe concern that he would not be able to tolerate commercial flight with transfer in any major airport; and this was conveyed to both the patient and his ex-wife at bedside with multiple nursing staff present.  Patient's ex-wife also requested transfer to [facility] in [place] as she currently resides in [place].  Discussed with her that this is unlikely, as patient has refused all intervention that we have at [facility] health, and [place] does not offer any higher level of care than what can be performed here.  She then further went, that she will likely have the patient leave [place] this afternoon.  The associated risks of leaving without further treatment was discussed with both patient's ex-wife and the patient with nursing present and they both voiced understanding. We discussed alternatives to current care plan and patient and ex-wife still voiced his decision to refuse treatment. Questions were answered and discussion of need for follow-up was discussed.   DVT  prophylaxis: SCDs Code Status: Full code Family Communication: Palliative care continues to follow with patient's family located in [place] and [place]. Discussed with patients ex-wife at bedside. Disposition Plan:  Palliative medicine following, overall poor prognosis. Unfortunately no local support due to recent divorce and immediate family in [place] and [place]. Representative from [place] has been following, patient will be transported with the medical jet to [place] when patient is deemed fit for travel.  There are significant concerns that patient may not be able to tolerate international flight due to his severe illness and palliative care will only complete required documents if his code status was updated to DNR, the patient and family both provide verbal or written confirmation that they understand the risk of death versus benefit of transport to [place], with approval of [place] worker.   Consultants:   General surgery  Palliative care  Neurology  Procedures:   none  Antimicrobials:  Zosyn 5/17>>>   Subjective: Patient seen and examined at bedside, ex-wife and nursing present.  Severely debilitated/cachectic in appearance.  Ex-wife continues to requests for expedited transport to [place] for further medical care. States " [place] has Materials engineer".  Continues to refuse any aggressive interventions here to include surgery.  Objective: Vitals:   07/30/18 1435 07/30/18 1500 07/30/18 1558 07/30/18 1615  BP: 108/69 109/70 118/62 112/72  Pulse: 87 (!) 103 (!) 110 (!) 108  Resp: 17 18 20 18   Temp: 97.6 F (36.4 C) 97.6 F (36.4 C) 98.8 F (37.1 C) 98.7 F (37.1 C)  TempSrc: Oral Oral Oral Oral  SpO2: 100% 100% 100%  100%  Weight:      Height:        Intake/Output Summary (Last 24 hours) at 07/30/2018 1624 Last data filed at 07/30/2018 1500 Gross per 24 hour  Intake 12557.81 ml  Output 1300 ml  Net 11257.81 ml   Filed Weights   07/28/18 1213 07/29/18 0500  07/30/18 0500  Weight: 49 kg 47 kg 46.1 kg    Examination:  General exam: Appears calm and comfortable, cachectic in appearance, language barrier but able to answer simple questions appropriately Respiratory system: Clear to auscultation. Respiratory effort normal. Cardiovascular system: S1 & S2 heard, RRR. No JVD, murmurs, rubs, gallops or clicks. No pedal edema. Gastrointestinal system: Abdomen is nondistended, soft and nontender. No organomegaly or masses felt. Normal bowel sounds heard. Central nervous system: Alert and oriented. No focal neurological deficits. Extremities: No peripheral edema Skin: No rashes, lesions or ulcers Psychiatry: Judgement and insight appear normal. Mood & affect appropriate.     Data Reviewed: I have personally reviewed following labs and imaging studies  CBC: Recent Labs  Lab 07/26/18 0427 07/26/18 1739 07/27/18 0357 07/27/18 1108 07/28/18 0338 07/30/18 0353  WBC 14.7*  --  14.2*  --  13.7* 13.8*  NEUTROABS  --   --  11.5*  --   --   --   HGB 5.7* 7.4* 6.5* 7.9* 7.4* 4.0*  HCT 18.3* 22.9* 20.6* 22.9* 22.6* 12.9*  MCV 88.0  --  88.4  --  88.3 93.5  PLT 347  --  343  --  379 710   Basic Metabolic Panel: Recent Labs  Lab 07/25/18 0352 07/26/18 0427 07/27/18 0357 07/28/18 0338 07/30/18 0353  NA 132* 131* 133* 137 140  K 4.0 3.9 3.9 3.8 3.8  CL 104 102 105 109 112*  CO2 24 23 22 25 22   GLUCOSE 106* 101* 104* 110* 107*  BUN 15 17 17 17  23*  CREATININE 0.32* <0.30* <0.30* <0.30* 0.40*  CALCIUM 7.2* 7.2* 7.5* 7.7* 7.5*  MG  --  2.0 2.1  --  2.0  PHOS  --  3.0 3.1  --  3.1   GFR: Estimated Creatinine Clearance: 69.6 mL/min (A) (by C-G formula based on SCr of 0.4 mg/dL (L)). Liver Function Tests: Recent Labs  Lab 07/27/18 0357 07/30/18 0353  AST 27 33  ALT 26 35  ALKPHOS 64 66  BILITOT 0.5 0.3  PROT 5.1* 4.6*  ALBUMIN 1.2* 1.3*   No results for input(s): LIPASE, AMYLASE in the last 168 hours. No results for input(s): AMMONIA  in the last 168 hours. Coagulation Profile: No results for input(s): INR, PROTIME in the last 168 hours. Cardiac Enzymes: No results for input(s): CKTOTAL, CKMB, CKMBINDEX, TROPONINI in the last 168 hours. BNP (last 3 results) No results for input(s): PROBNP in the last 8760 hours. HbA1C: No results for input(s): HGBA1C in the last 72 hours. CBG: Recent Labs  Lab 07/25/18 2329 07/26/18 0607 07/27/18 0811 07/28/18 0808 07/30/18 0553  GLUCAP 110* 105* 115* 105* 106*   Lipid Profile: No results for input(s): CHOL, HDL, LDLCALC, TRIG, CHOLHDL, LDLDIRECT in the last 72 hours. Thyroid Function Tests: No results for input(s): TSH, T4TOTAL, FREET4, T3FREE, THYROIDAB in the last 72 hours. Anemia Panel: No results for input(s): VITAMINB12, FOLATE, FERRITIN, TIBC, IRON, RETICCTPCT in the last 72 hours. Sepsis Labs: No results for input(s): PROCALCITON, LATICACIDVEN in the last 168 hours.  Recent Results (from the past 240 hour(s))  Urine Culture     Status: None  Collection Time: 07/27/18  9:49 AM  Result Value Ref Range Status   Specimen Description URINE, RANDOM  Final   Special Requests NONE  Final   Culture   Final    NO GROWTH Performed at [facility], [address], [place], [place] [address]    Report Status 07/28/2018 FINAL  Final         Radiology Studies: No results found.      Scheduled Meds:  Chlorhexidine Gluconate Cloth  6 each Topical Q0600   nicotine  21 mg Transdermal Daily   Continuous Infusions:  sodium chloride Stopped (07/30/18 0445)   piperacillin-tazobactam (ZOSYN)  IV Stopped (07/30/18 1139)   TPN ADULT (ION) 75 mL/hr at 07/30/18 0447   TPN ADULT (ION)       LOS: 11 days    Time spent: 31 minutes    Icholas Irby J [place] DO [facility] Pager [phone]  If 7PM-7AM, please contact night-coverage [email or web address] Password [facility] 07/30/2018, 4:24 PM

## 2018-07-31 LAB — GLUCOSE, CAPILLARY: Glucose-Capillary: 103 mg/dL — ABNORMAL HIGH (ref 70–99)

## 2018-07-31 LAB — HEMOGLOBIN AND HEMATOCRIT, BLOOD
HCT: 24 % — ABNORMAL LOW (ref 39.0–52.0)
Hemoglobin: 8 g/dL — ABNORMAL LOW (ref 13.0–17.0)

## 2018-07-31 MED ORDER — MORPHINE SULFATE (PF) 2 MG/ML IV SOLN
1.0000 mg | INTRAVENOUS | Status: DC | PRN
  Administered 2018-07-31 – 2018-08-01 (×2): 2 mg via INTRAVENOUS
  Administered 2018-08-01: 20:00:00 1 mg via INTRAVENOUS
  Administered 2018-08-03 – 2018-08-04 (×6): 2 mg via INTRAVENOUS
  Filled 2018-07-31 (×10): qty 1

## 2018-07-31 MED ORDER — TRAVASOL 10 % IV SOLN
INTRAVENOUS | Status: AC
  Administered 2018-07-31: 18:00:00 via INTRAVENOUS
  Filled 2018-07-31: qty 1098

## 2018-07-31 NOTE — Progress Notes (Signed)
Physical Therapy Treatment Patient Details Name: Brandon Landry MRN: [id number] DOB: 1965-03-16 Today's Date: 07/31/2018    History of Present Illness Pt is a 53 y/o male admitted secondary to symptomatic anemia. CT of abdomen revealed right colon mass with possible extension to involve the duodenum. Pt also with LUE weakness and MRI revealed numerous punctate foci of acute/early subacute infarction present throughout the right greater than left posterior cerebral hemispheres and the right cerebellar hemisphere. PMH includes depression, iron deficiency anemia, and tobacco abuse.     PT Comments    Patient seen for mobility progression. Pt continues to make progress toward PT goals and is tolerating increased activity well. This session focused on gait training without use of AD. Pt continues to very deconditioned and will benefit from continued skilled PT services. Looks like d/c plan is to return to [place] to be with family. PT will continue to follow acutely and progress as tolerated.     Follow Up Recommendations  Home health PT;Supervision for mobility/OOB     Equipment Recommendations  Rolling walker with 5" wheels    Recommendations for Other Services       Precautions / Restrictions Precautions Precautions: Fall    Mobility  Bed Mobility Overal bed mobility: Modified Independent             General bed mobility comments: HOB elevated and increased time/effort needed  Transfers Overall transfer level: Needs assistance   Transfers: Sit to/from Stand Sit to Stand: Min guard         General transfer comment: min guard for safety  Ambulation/Gait Ambulation/Gait assistance: Min guard Gait Distance (Feet): 300 Feet Assistive device: None Gait Pattern/deviations: Step-through pattern;Decreased stride length;Trunk flexed;Narrow base of support;Decreased dorsiflexion - right;Decreased dorsiflexion - left Gait velocity: decreased   General Gait Details: cues for upright  posture and attempting to increase cadence and stride length; pt is guarded without use of AD; no LOB but pt did not attempt head turns when asked    Stairs             Wheelchair Mobility    Modified Rankin (Stroke Patients Only) Modified Rankin (Stroke Patients Only) Pre-Morbid Rankin Score: Slight disability Modified Rankin: Moderately severe disability     Balance Overall balance assessment: Needs assistance;History of Falls Sitting-balance support: No upper extremity supported;Feet supported Sitting balance-Leahy Scale: Good     Standing balance support: During functional activity;No upper extremity supported Standing balance-Leahy Scale: Fair                              Cognition Arousal/Alertness: Awake/alert Behavior During Therapy: WFL for tasks assessed/performed Overall Cognitive Status: Within Functional Limits for tasks assessed                           Safety/Judgement: Decreased awareness of safety            Exercises General Exercises - Lower Extremity Long Arc Quad: AROM;Both;Seated;Other (comment)(with 5 second holds) Hip Flexion/Marching: AROM;Both;Seated    General Comments        Pertinent Vitals/Pain Pain Assessment: No/denies pain    Home Living                      Prior Function            PT Goals (current goals can now be found in the care plan section) Progress towards  PT goals: Progressing toward goals    Frequency    Min 3X/week      PT Plan Current plan remains appropriate    Co-evaluation              AM-PAC PT "6 Clicks" Mobility   Outcome Measure  Help needed turning from your back to your side while in a flat bed without using bedrails?: None Help needed moving from lying on your back to sitting on the side of a flat bed without using bedrails?: A Little Help needed moving to and from a bed to a chair (including a wheelchair)?: A Little Help needed standing up  from a chair using your arms (e.g., wheelchair or bedside chair)?: A Little Help needed to walk in hospital room?: A Little Help needed climbing 3-5 steps with a railing? : A Little 6 Click Score: 19    End of Session Equipment Utilized During Treatment: Gait belt Activity Tolerance: Patient tolerated treatment well Patient left: in bed;with call bell/phone within reach Nurse Communication: Mobility status PT Visit Diagnosis: Other abnormalities of gait and mobility (R26.89);Unsteadiness on feet (R26.81);Muscle weakness (generalized) (M62.81)     Time: 4888-9169 PT Time Calculation (min) (ACUTE ONLY): 20 min  Charges:  $Gait Training: 8-22 mins                     Earney Navy, PTA Acute Rehabilitation Services Pager: [phone] Office: [phone]     Darliss Cheney 07/31/2018, 10:06 AM

## 2018-07-31 NOTE — TOC Progression Note (Signed)
Transition of Care [facility]) - Progression Note    Patient Details  Name: Brandon Landry MRN: [id number] Date of Birth: [birth year]  Transition of Care [facility]) CM/SW [place], LCSW Phone Number: 07/31/2018, 10:00 AM  Clinical Narrative:    CSW received call from Clide Dales with [employer] [facility] [place]. He states that they are arranging a flight for a tentative date of Tuesday 08/04/18. CSW inquired about more details of the plan, such as if the patient would be picked up and accompanied to the airport by medical staff. He inquired if we were able to provide a nurse and CSW alerted him that the hospital is unable to do so. He stated they would search for a company to travel with the patient and asked if the ambulance would be able to take the patient to [place] airport. CSW notified him that [facility] could with advance notice and upfront payment. [name] agreed that payment would be completed. CSW inquired as to why the patient cannot leave from the [place] airport since it would be a long drive to [place] and he reported that the airlines were not responsive with their request. He requested patient's vitals and progress note be sent to them as well as a copy of the patient's hospital bill, which is being managed by a company hired by the patient's family called [employer] out of [place]. CSW alerted him that it is not be possible to release the patient's bill prior to discharge. He reported understanding. CSW let him know that this must be up to the patient and his family due to the medical risks involved in transport. [name] stated that he is well aware and the patient and family are insistent upon the evacuation.    Expected Discharge Plan: [place] Barriers to Discharge: Continued Medical Work up  Expected Discharge Plan and Services Expected Discharge Plan: [place] In-house Referral: Clinical Social Work, Hospice / [place] arrangements  for the past 2 months: Apartment                 DME Arranged: N/A DME Agency: NA       HH Arranged: NA HH Agency: NA         Social Determinants of Health (SDOH) Interventions    Readmission Risk Interventions Readmission Risk Prevention Plan 07/07/2018  Transportation Screening Complete

## 2018-07-31 NOTE — Progress Notes (Signed)
Nutrition Follow-up  RD working remotely.  DOCUMENTATION CODES:   Underweight, Severe malnutrition in context of chronic illness  INTERVENTION:   -TPN management per pharmacy -Follow-up for ability to transition to enteral feedings -Adjusted nutritional needs secondary to ongoing weight loss: 2250-2450 kcals, 115-130 grams protein, > 2.2 L fluid daily  NUTRITION DIAGNOSIS:   Severe Malnutrition related to chronic illness(colonic mass) as evidenced by energy intake < 75% for > or equal to 1 month, moderate fat depletion, severe fat depletion, moderate muscle depletion, severe muscle depletion.  Ongoing  GOAL:   Patient will meet greater than or equal to 90% of their needs  Progressing  MONITOR:   PO intake, Supplement acceptance, Labs, Weight trends, Skin, I & O's  REASON FOR ASSESSMENT:   Consult New TPN/TNA  ASSESSMENT:   Brandon Landry is a 53 y.o. male with medical history significant of depression, iron deficiency anemia, tobacco abuse, who presents with generalized weakness.  5/18- s/p BSE-advanced to regular diet with thin liquids 5/19- PICC placed, TPN initiated 5/23- decreased to clear liquids due to abdominal pain and fullness; KUB revealed no perforation of bowel obstruction 5/24- advanced to full liquids 5/25- advanced to soft diet   Reviewed I/O's: +3.6 L x 24 hours and +14.4 L since admission  UOP: 275 ml x 24 hours  Per RN notes, pt experienced nausea with emesis last night.   Pt continues to refuse meals. He remains dependent on TPN- currently infusing at 75 ml/hr, which provides 1867 kcals and 110 grams of protein, meeting 83% of estimated kcal needs and 96% of estimated protein needs. Pt continues to lose weight and nutritional needs were re-adjusted due to weight loss.   RD attempted to obtain nutrition history and discuss possible feeding tube placement with pton 07/22/18/20, however, pt was very tangential and appeared very overwhelmed with current  health status. Please review noted dated 07/22/18 for further details.While nutritional support via enteral route would be most ideal for pt(due to functioning GI tract), unsure if pt would agree to a temporary feeding tube (such as cortrak) at this time. Additionally, if pt is to go home or to SNF in the immediate future, a short term feeding tube may complicate discharge disposition, unless permanent enteral access is established (also unsure of likelihood of placing permanent access such as a PEG due to pt's medically fragile state). Due to severityof pt's malnutrition, recommend continue TPN if pt not amenable to short term feeding tube.  Palliative care team and CSW following. Pt still desires to pursue medical transport to [place], where he has more family and resources to pursue treatment. Family has been working to set-up medical transport to [place]. Pt remains at high travel risk. Per MD notes, possible transfer date 08/04/2018 out of [place], [place].  Labs reviewed: CBGS: 103.   Diet Order:   Diet Order            DIET SOFT Room service appropriate? No; Fluid consistency: Thin  Diet effective now              EDUCATION NEEDS:   Education needs have been addressed  Skin:  Skin Assessment: Reviewed RN Assessment  Last BM:  07/29/18  Height:   Ht Readings from Last 1 Encounters:  07/24/18 5' 8.11" (1.73 m)    Weight:   Wt Readings from Last 1 Encounters:  07/31/18 46 kg    Ideal Body Weight:  70 kg  BMI:  Body mass index is 15.37 kg/m.  Estimated  Nutritional Needs:   Kcal:  2250-2450  Protein:  115-130 grams  Fluid:  > 2.2 L    Zamiyah Resendes A. Jimmye Norman, RD, LDN, [place] Registered Dietitian II Certified Diabetes Care and Education Specialist Pager: [phone] After hours Pager: [phone]

## 2018-07-31 NOTE — TOC Progression Note (Signed)
Transition of Care [facility]) - Progression Note    Patient Details  Name: Ana Liaw MRN: [id number] Date of Birth: 10/05/1965  Transition of Care [facility]) CM/SW [place], LCSW Phone Number: 07/31/2018, 12:33 PM  Clinical Narrative:    CSW obtained signed consent form from patient to send clinical notes to [place], placed I his shadow chart. CSW sent requested notes.    Expected Discharge Plan: [place] Barriers to Discharge: Continued Medical Work up  Expected Discharge Plan and Services Expected Discharge Plan: [place] In-house Referral: Clinical Social Work, Hospice / [place] arrangements for the past 2 months: Apartment                 DME Arranged: N/A DME Agency: NA       HH Arranged: NA HH Agency: NA         Social Determinants of Health (SDOH) Interventions    Readmission Risk Interventions Readmission Risk Prevention Plan 07/07/2018  Transportation Screening Complete

## 2018-07-31 NOTE — Progress Notes (Signed)
Palliative Medicine RN Note: Rec'd voicemail from someone at [employer] asking for call back with fax number to send billing info for Mr Henefer. The phone number and name of caller were too garbled to understand. I attempted to call the company back at the number from which the call originated, but they are unable to find a claim for this patient or figure out who called.  PMT has no further role in this patient's care. We are unable to provide billing information/contact, forms, or signatures. If more calls are rec'd about this patient or his care, please route them to SW, attending, or billing, depending on the needs.  Marjie Skiff Quy Lotts, RN, BSN, [facility] Palliative Medicine Team 07/31/2018 9:39 AM Office [phone]

## 2018-07-31 NOTE — Progress Notes (Signed)
PROGRESS NOTE    Brandon Landry  HUT:[id number] DOB: Jul 25, 1965 DOA: 07/19/2018 PCP: Patient, No Pcp Per    Brief Narrative:   Patient is a35 y.o.malewho was hospitalized from 5/4 to 5/6 after he was found to have severe microcytic anemia with a hemoglobin of 3.1, he was transfused PRBC, CT scan of the abdomen on 5/4 showed a large fungating mass in the ascending colon with contained perforation and adjacent abscess formation, general surgery was subsequently consulted-surgical intervention was contemplated-however patient refused and was subsequently discharged home on 5/6. He returned [facility] on 5/16 with generalized weakness, and was found to have a hemoglobin of3.2.He was transfused 2 units of PRBC, and transferred to [facility]. He now wants to pursue surgical options.  General surgery was consulted, cannot undergo surgery due to severe malnutrition, patient refuses to eat American food. Palliative care was consulted, currently working with patient and family regarding goals of care. PICC line has been placed and patient has been started on TPN for nourishment. Patient now wishes to go to [place] for all of his medical care, being investigated by palliative medicine. 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 No fever or abdominal pain. Repeat CT scan showed fungating mass with ascites. General surgery has seen the patient and there is no plan for surgery at this time since the patient has refused surgical intervention here and wishes to go to [place], and furthermore he is severely malnourished.   --Continues with mild leukocytosis, WBC count 13.8. --continue empiric antibiotics with Zosyn --Family trying to arrange air transportation with "[employer] [facility] [place]".  Possible tentative transfer date out of [place] on 08/04/2018. --Grim prognosis especially in the setting of refusal of interventions --supporitive care  Severe microcytic anemia, present on admission Etiology likely secondary to colon cancer.  Initial hemoglobin of 3.2.  Patient received 2 units of packed RBC at [facility] followed by 6 units of PRBC in our hospital. --Hgb 7.9-->7.4-->4.0-->8.0 today --Continue monitor hemoglobin daily, will transfuse for hemoglobin less than 7.0  Left arm weakness, bilateral strokes MRI brain showed numerous punctate foci of acute/early subacute infarctions in the right greater than left posterior cerebral hemispheres, right cerebellar hemispheres, no enhancing metastasis. CT chest showed small bilateral pleural effusions with mural filling defect along the left right lateral wall of ascending thoracic aorta concerning for thrombus. CTA aorta showed several areas of mural thickening and intimal irregularity noted in the ascending aorta favoring infectious vegetation/thrombus. 2D echo showed EF of 40 to 45% with low flow state LV, recommend rule out thrombus.  -Neurology following, not on antithrombotic agents given severe anemia. Not a candidate for anticoagulation unless anemia and bleeding source is secured. However patient has refused surgery for the fungating colon  mass.  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 Recommended palliative and hospice at this time.  Left arm swelling Doppler studies negative for DVT  Severe protein calorie malnutrition, hypoalbuminemia Continue TNA.  Has poor appetite, refuses "American food"   DVT prophylaxis: SCDs Code Status: Full code Family Communication: Palliative care continues to follow with patient's family located in [place] and [place]. Discussed with patients ex-wife at bedside. Disposition Plan:  Palliative medicine following, overall poor prognosis. Unfortunately no local support due to recent divorce and immediate family in [place] and [place]. Representative from [place] has been following, patient will be transported with the medical jet to [place], possibly on 08/04/2018 out of [place], [place] per case management notes.  Consultants:   General surgery  Palliative care  Neurology  Procedures:   none  Antimicrobials:  Zosyn 5/17>>>   Subjective: Patient seen and examined at bedside, resting comfortably.  Observed working with PT walking around the unit.  Patient without complaints this morning.  Pain controlled.  Denies headache, no fever/chills/night sweats, no nausea/vomiting/diarrhea, no cough/congestion, no chest pain, no palpitations.  No acute events overnight per nursing staff.  Objective: Vitals:   07/31/18 0339 07/31/18 0344 07/31/18 0433 07/31/18 1317  BP: 91/68 99/65  108/84  Pulse: 81 84  89  Resp:    (!) 6  Temp: 98.3 F (36.8 C)   97.7 F (36.5 C)  TempSrc: Oral   Oral  SpO2: 100% 100%  100%  Weight:   46 kg   Height:        Intake/Output Summary (Last 24 hours) at 07/31/2018 1424 Last data filed at 07/31/2018 0900 Gross per 24 hour  Intake 3163.44 ml  Output --  Net 3163.44 ml   Filed Weights   07/29/18 0500 07/30/18 0500 07/31/18 0433  Weight: 47 kg  46.1 kg 46 kg    Examination:  General exam: Appears calm and comfortable, cachectic in appearance, language barrier but able to answer simple questions appropriately Respiratory system: Clear to auscultation. Respiratory effort normal. Cardiovascular system: S1 & S2 heard, RRR. No JVD, murmurs, rubs, gallops or clicks. No pedal edema. Gastrointestinal system: Abdomen is nondistended, soft and nontender. No organomegaly or masses felt. Normal bowel sounds heard. Central nervous system: Alert and oriented. No focal neurological deficits. Extremities: No peripheral edema Skin: No rashes, lesions or ulcers Psychiatry: Judgement and insight appear normal. Mood & affect appropriate.     Data Reviewed: I have personally reviewed following labs and imaging studies  CBC: Recent Labs  Lab 07/26/18 0427  07/27/18 0357 07/27/18 1108 07/28/18 0338 07/30/18 0353 07/31/18 0518  WBC 14.7*  --  14.2*  --  13.7* 13.8*  --   NEUTROABS  --   --  11.5*  --   --   --   --   HGB 5.7*   < > 6.5* 7.9* 7.4* 4.0* 8.0*  HCT 18.3*   < > 20.6* 22.9* 22.6* 12.9* 24.0*  MCV 88.0  --  88.4  --  88.3 93.5  --   PLT 347  --  343  --  379 330  --    < > = values in this interval not displayed.   Basic Metabolic Panel: Recent Labs  Lab 07/25/18 0352 07/26/18 0427 07/27/18 0357 07/28/18 0338 07/30/18 0353  NA 132* 131* 133* 137 140  K 4.0 3.9  3.9 3.8 3.8  CL 104 102 105 109 112*  CO2 24 23 22 25 22   GLUCOSE 106* 101* 104* 110* 107*  BUN 15 17 17 17  23*  CREATININE 0.32* <0.30* <0.30* <0.30* 0.40*  CALCIUM 7.2* 7.2* 7.5* 7.7* 7.5*  MG  --  2.0 2.1  --  2.0  PHOS  --  3.0 3.1  --  3.1   GFR: Estimated Creatinine Clearance: 69.5 mL/min (A) (by C-G formula based on SCr of 0.4 mg/dL (L)). Liver Function Tests: Recent Labs  Lab 07/27/18 0357 07/30/18 0353  AST 27 33  ALT 26 35  ALKPHOS 64 66  BILITOT 0.5 0.3  PROT 5.1* 4.6*  ALBUMIN 1.2* 1.3*   No results for input(s): LIPASE, AMYLASE in the  last 168 hours. No results for input(s): AMMONIA in the last 168 hours. Coagulation Profile: No results for input(s): INR, PROTIME in the last 168 hours. Cardiac Enzymes: No results for input(s): CKTOTAL, CKMB, CKMBINDEX, TROPONINI in the last 168 hours. BNP (last 3 results) No results for input(s): PROBNP in the last 8760 hours. HbA1C: No results for input(s): HGBA1C in the last 72 hours. CBG: Recent Labs  Lab 07/26/18 0607 07/27/18 0811 07/28/18 0808 07/30/18 0553 07/31/18 0755  GLUCAP 105* 115* 105* 106* 103*   Lipid Profile: No results for input(s): CHOL, HDL, LDLCALC, TRIG, CHOLHDL, LDLDIRECT in the last 72 hours. Thyroid Function Tests: No results for input(s): TSH, T4TOTAL, FREET4, T3FREE, THYROIDAB in the last 72 hours. Anemia Panel: No results for input(s): VITAMINB12, FOLATE, FERRITIN, TIBC, IRON, RETICCTPCT in the last 72 hours. Sepsis Labs: No results for input(s): PROCALCITON, LATICACIDVEN in the last 168 hours.  Recent Results (from the past 240 hour(s))  Urine Culture     Status: None   Collection Time: 07/27/18  9:49 AM  Result Value Ref Range Status   Specimen Description URINE, RANDOM  Final   Special Requests NONE  Final   Culture   Final    NO GROWTH Performed at [facility], [address], [place], [place] [address]    Report Status 07/28/2018 FINAL  Final         Radiology Studies: No results found.      Scheduled Meds:  Chlorhexidine Gluconate Cloth  6 each Topical Q0600   nicotine  21 mg Transdermal Daily   Continuous Infusions:  sodium chloride Stopped (07/30/18 0445)   piperacillin-tazobactam (ZOSYN)  IV 3.375 g (07/31/18 1010)   TPN ADULT (ION) 75 mL/hr at 07/30/18 1816   TPN ADULT (ION)       LOS: 12 days    Time spent: 31 minutes    Mikiya Nebergall J [place] DO [facility] Pager [phone]  If 7PM-7AM, please contact night-coverage [email or web address] Password [facility] 07/31/2018, 2:24 PM

## 2018-07-31 NOTE — Progress Notes (Signed)
PHARMACY - ADULT TOTAL PARENTERAL NUTRITION CONSULT NOTE   Pharmacy Consult for TPN Indication: Severe malnutrition; R-colon mass extending into duodenum   Patient Measurements: Height: 5' 8.11" (173 cm) Weight: 101 lb 6.6 oz (46 kg) IBW/kg (Calculated) : 68.65   Body mass index is 15.37 kg/m.   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 He is very malnourished and refuses to eat (only wants [place]). He is ok with TPN and needs improved nutrition if going to have surgery. Palliative care on board.   GI: Pre-albumin <5>>7.3. LBM 5/27; emesis x1 (amount not recorded). Endo: CBGs < 140s with TPN. No SSI Insulin requirements in the past 24 hours: 0 units Lytes: Na 137>>140, K 3.8, Phos 3.1 and Mag 2. CoCa 9.7 (alb 1.3) Renal: SCr 0.4, BUN 23, Good UOP Pulm: no issues - RA Cards: VSS Hepatobil: LFT/Tbili/TG within normal limits.  Neuro: pain 0-2. Got high dose Thiamine, now with 100mg  daily in TPN. ID: WBC 13.8 (unchanged) on Zosyn. Afebrile. PCT 0.10. Heme: Hgb 4 >>8 post transfusion  TPN Access: PICC  TPN start date: 07/21/18 Nutritional Goals (per RD recommendation on 5/28): KCal: 1850-2050 Protein: 105-120g Fluid: >1.8L  Goal TPN rate is 75 ml/hr (provides 110g protein, 54g lipids, and 261g dextrose, 1867 kcal meeting 100% of needs)  Current Nutrition:  Regular diet- 0% TPN  Plan:  Continue TPN at 75 mL/hr.  This TPN provides 110g protein, 54g lipids, and 261g dextrose, 1867 kcal meeting 100% of needs Electrolytes in TPN: continue increased Na more and decreased Phos and continue slightly increased Mg, others standard for now. XM:IWOEHOZ 1:1.  Add MVI to TPN Add Thiamine to TPN and discontinue oral order. Trace Elements on MWF only due to shortage Monitor TPN labs Follow-up plan of care  Sloan Leiter, PharmD, BCPS, BCCCP Clinical Pharmacist Please refer to [facility] for [place]  numbers 07/31/2018, 8:49 AM

## 2018-07-31 NOTE — Progress Notes (Signed)
Occupational Therapy Treatment Patient Details Name: Brandon Landry MRN: [id number] DOB: [birth year] Today's Date: 07/31/2018    History of present illness Pt is a 53 y/o male admitted secondary to symptomatic anemia. CT of abdomen revealed right colon mass with possible extension to involve the duodenum. Pt also with LUE weakness and MRI revealed numerous punctate foci of acute/early subacute infarction present throughout the right greater than left posterior cerebral hemispheres and the right cerebellar hemisphere. PMH includes depression, iron deficiency anemia, and tobacco abuse.    OT comments  Pt in a better mood today. Pt performing ADL functional mobility and transfers in room with minguardA to [place]. Pt abruptly with stomach pain, but unable to have BM. Pt overall supervisionA for ADL and groomed at sink x5 mins of standing. Pt's RN aware of stomach pain. Pt would benefit from continued OT skilled services for ADL, mobiltiy and safety in SNF setting. OT to follow acutely.     Follow Up Recommendations  SNF;Supervision/Assistance - 24 hour    Equipment Recommendations  3 in 1 bedside commode    Recommendations for Other Services      Precautions / Restrictions Precautions Precautions: Fall Restrictions Weight Bearing Restrictions: No       Mobility Bed Mobility Overal bed mobility: Modified Independent Bed Mobility: Sit to Supine     Supine to sit: Supervision     General bed mobility comments: HOB elevated and increased time/effort needed  Transfers Overall transfer level: Needs assistance   Transfers: Sit to/from Stand Sit to Stand: Min guard         General transfer comment: min guard for safety    Balance Overall balance assessment: Needs assistance;History of Falls Sitting-balance support: No upper extremity supported;Feet supported Sitting balance-Leahy Scale: Good     Standing balance support: During functional activity;No upper extremity  supported Standing balance-Leahy Scale: Fair                             ADL either performed or assessed with clinical judgement   ADL Overall ADL's : Needs assistance/impaired     Grooming: Wash/dry hands;Wash/dry face;Oral care;Brushing hair;Set up;Supervision/safety;Standing                   Toilet Transfer: Min guard;Comfort height toilet;BSC;Grab bars   Toileting- Clothing Manipulation and Hygiene: Min guard;Sitting/lateral lean;Sit to/from stand;Cueing for safety;Cueing for sequencing       Functional mobility during ADLs: Min guard;Rolling walker;Cueing for safety General ADL Comments: Pt performing bed mobility to EOB, sit to stand with supervisionA, transfer to commode with minguardA. Pt did not have a BM at this time, but performing grooming at sink with supervisionA to minguardA.     Vision   Vision Assessment?: No apparent visual deficits   Perception     Praxis      Cognition Arousal/Alertness: Awake/alert Behavior During Therapy: WFL for tasks assessed/performed Overall Cognitive Status: Within Functional Limits for tasks assessed                           Safety/Judgement: Decreased awareness of safety     General Comments: Pt in better spirits today        Exercises Exercises: General Lower Extremity General Exercises - Lower Extremity Long Arc Quad: AROM;Both;Seated;Other (comment)(with 5 second holds) Hip Flexion/Marching: AROM;Both;Seated   Shoulder Instructions       General Comments  Pertinent Vitals/ Pain       Pain Assessment: Faces Faces Pain Scale: Hurts even more Pain Location: stomach Pain Descriptors / Indicators: Discomfort Pain Intervention(s): Monitored during session  Home Living                                          Prior Functioning/Environment              Frequency  Min 2X/week        Progress Toward Goals  OT Goals(current goals can now be found  in the care plan section)  Progress towards OT goals: Progressing toward goals  Acute Rehab OT Goals Patient Stated Goal: none stated OT Goal Formulation: With patient Time For Goal Achievement: 08/14/18 Potential to Achieve Goals: Fair ADL Goals Pt Will Perform Eating: Independently;sitting Pt Will Perform Grooming: with modified independence;standing Pt Will Perform Upper Body Dressing: with modified independence;standing Pt Will Perform Lower Body Dressing: with modified independence;sit to/from stand Pt Will Transfer to Toilet: with modified independence;stand pivot transfer Pt Will Perform Toileting - Clothing Manipulation and hygiene: with modified independence;sitting/lateral leans;sit to/from stand Pt Will Perform Tub/Shower Transfer: Tub transfer;3 in 1;ambulating;with modified independence;rolling walker Pt/caregiver will Perform Home Exercise Program: Left upper extremity;With minimal assist  Plan Discharge plan remains appropriate    Co-evaluation                 AM-PAC OT "6 Clicks" Daily Activity     Outcome Measure   Help from another person eating meals?: None Help from another person taking care of personal grooming?: A Little Help from another person toileting, which includes using toliet, bedpan, or urinal?: A Little Help from another person bathing (including washing, rinsing, drying)?: A Little Help from another person to put on and taking off regular upper body clothing?: A Little Help from another person to put on and taking off regular lower body clothing?: A Little 6 Click Score: 19    End of Session Equipment Utilized During Treatment: Gait belt;Rolling walker  OT Visit Diagnosis: Other abnormalities of gait and mobility (R26.89);Muscle weakness (generalized) (M62.81);Other symptoms and signs involving cognitive function;History of falling (Z91.81);Unsteadiness on feet (R26.81)   Activity Tolerance Patient tolerated treatment well   Patient  Left in bed;with call bell/phone within reach   Nurse Communication Mobility status        Time: 1010-1034 OT Time Calculation (min): 24 min  Charges: OT General Charges $OT Visit: 1 Visit OT Treatments $Self Care/Home Management : 8-22 mins $Therapeutic Activity: 8-22 mins  Darryl Nestle) Marsa Aris OTR/L Acute Rehabilitation Services Pager: [phone] Office: [phone]    Jenene Slicker Lavern Crimi 07/31/2018, 1:33 PM

## 2018-07-31 NOTE — TOC Progression Note (Signed)
Transition of Care [facility]) - Progression Note    Patient Details  Name: Brandon Landry MRN: [id number] Date of Birth: 1966/02/12  Transition of Care [facility]) CM/SW [place], LCSW Phone Number: 07/31/2018, 5:47 PM  Clinical Narrative:    CSW spoke with patient's APS Worker, Brandon Landry. He reports that he spoke with the patient's ex-wife [name] [phone]) and will be meeting her on Monday at patient's apartment to look for patient's passport per patient request.    Expected Discharge Plan: Skilled Nursing Facility Barriers to Discharge: Continued Medical Work up  Expected Discharge Plan and Services Expected Discharge Plan: [place] In-house Referral: Clinical Social Work, Hospice / [place] arrangements for the past 2 months: Apartment                 DME Arranged: N/A DME Agency: NA       HH Arranged: NA HH Agency: NA         Social Determinants of Health (SDOH) Interventions    Readmission Risk Interventions Readmission Risk Prevention Plan 07/07/2018  Transportation Screening Complete

## 2018-08-01 ENCOUNTER — Inpatient Hospital Stay (HOSPITAL_COMMUNITY): Payer: Medicaid Other

## 2018-08-01 LAB — CBC
HCT: 21.6 % — ABNORMAL LOW (ref 39.0–52.0)
Hemoglobin: 7 g/dL — ABNORMAL LOW (ref 13.0–17.0)
MCH: 28.5 pg (ref 26.0–34.0)
MCHC: 32.4 g/dL (ref 30.0–36.0)
MCV: 87.8 fL (ref 80.0–100.0)
Platelets: 334 10*3/uL (ref 150–400)
RBC: 2.46 MIL/uL — ABNORMAL LOW (ref 4.22–5.81)
RDW: 17.9 % — ABNORMAL HIGH (ref 11.5–15.5)
WBC: 12.5 10*3/uL — ABNORMAL HIGH (ref 4.0–10.5)
nRBC: 0 % (ref 0.0–0.2)

## 2018-08-01 LAB — MAGNESIUM: Magnesium: 2.1 mg/dL (ref 1.7–2.4)

## 2018-08-01 LAB — PREPARE RBC (CROSSMATCH)

## 2018-08-01 LAB — BASIC METABOLIC PANEL
Anion gap: 9 (ref 5–15)
BUN: 20 mg/dL (ref 6–20)
CO2: 23 mmol/L (ref 22–32)
Calcium: 8 mg/dL — ABNORMAL LOW (ref 8.9–10.3)
Chloride: 108 mmol/L (ref 98–111)
Creatinine, Ser: 0.36 mg/dL — ABNORMAL LOW (ref 0.61–1.24)
GFR calc Af Amer: >60 mL/min (ref >60)
GFR calc non Af Amer: >60 mL/min (ref >60)
Glucose, Bld: 106 mg/dL — ABNORMAL HIGH (ref 70–99)
Potassium: 3.4 mmol/L — ABNORMAL LOW (ref 3.5–5.1)
Sodium: 140 mmol/L (ref 135–145)

## 2018-08-01 LAB — HEMOGLOBIN AND HEMATOCRIT, BLOOD
HCT: 23.6 % — ABNORMAL LOW (ref 39.0–52.0)
Hemoglobin: 7.9 g/dL — ABNORMAL LOW (ref 13.0–17.0)

## 2018-08-01 LAB — GLUCOSE, CAPILLARY: Glucose-Capillary: 101 mg/dL — ABNORMAL HIGH (ref 70–99)

## 2018-08-01 MED ORDER — MORPHINE SULFATE (PF) 2 MG/ML IV SOLN
2.0000 mg | Freq: Once | INTRAVENOUS | Status: AC
  Administered 2018-08-01: 2 mg via INTRAVENOUS

## 2018-08-01 MED ORDER — SODIUM CHLORIDE 0.9% IV SOLUTION: Freq: Once | INTRAVENOUS | Status: DC

## 2018-08-01 MED ORDER — METOCLOPRAMIDE HCL 5 MG/ML IJ SOLN
10.0000 mg | Freq: Once | INTRAMUSCULAR | Status: DC
  Filled 2018-08-01 (×3): qty 2

## 2018-08-01 MED ORDER — POTASSIUM CHLORIDE 10 MEQ/100ML IV SOLN
10.0000 meq | INTRAVENOUS | Status: AC
  Administered 2018-08-01 (×6): 10 meq via INTRAVENOUS
  Filled 2018-08-01 (×6): qty 100

## 2018-08-01 MED ORDER — PROMETHAZINE HCL 25 MG/ML IJ SOLN
12.5000 mg | Freq: Four times a day (QID) | INTRAMUSCULAR | Status: AC | PRN
  Administered 2018-08-01 – 2018-08-03 (×2): 12.5 mg via INTRAVENOUS
  Filled 2018-08-01 (×2): qty 1

## 2018-08-01 MED ORDER — TRAVASOL 10 % IV SOLN
INTRAVENOUS | Status: AC
  Administered 2018-08-01: 18:00:00 via INTRAVENOUS
  Filled 2018-08-01: qty 1254

## 2018-08-01 MED ORDER — POTASSIUM CHLORIDE 10 MEQ/50ML IV SOLN
10.0000 meq | INTRAVENOUS | Status: DC
  Filled 2018-08-01 (×2): qty 50

## 2018-08-01 NOTE — Progress Notes (Addendum)
Pt asking for pain med.  Next due at 1200, 10/10 pain in abd, family member called from home to make me aware pt is calling home in pain.  Messaged [facility]  New order for pain med on MAR.at 22:49

## 2018-08-01 NOTE — Progress Notes (Signed)
PHARMACY - ADULT TOTAL PARENTERAL NUTRITION CONSULT NOTE   Pharmacy Consult for TPN Indication: Severe malnutrition; R-colon mass extending into duodenum   Patient Measurements: Height: 5' 8.11" (173 cm) Weight: 104 lb 8 oz (47.4 kg) IBW/kg (Calculated) : 68.65   Body mass index is 15.84 kg/m.   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 He is very malnourished and refuses to eat (only wants [place]). He is ok with TPN and needs improved nutrition if going to have surgery. Palliative care on board.   GI: Pre-albumin <5>>7.3. LBM 5/27;  Endo: CBGs < 140s with TPN. No SSI Insulin requirements in the past 24 hours: 0 units Lytes: K 3.4, Mg 2.1 Renal: SCr 0.36 0.5 mL/kg/hr Pulm: no issues - RA Cards: VSS Hepatobil: LFT/Tbili/TG within normal limits.  Neuro: pain 0-2. Got high dose Thiamine, now with 100mg  daily in TPN. ID: WBC 12.5 on Zosyn. Afebrile. PCT 0.10. Heme: Hgb 4 >>8 post transfusion  TPN Access: PICC  TPN start date: 07/21/18 Nutritional Goals (per RD recommendation on 5/30): KCal: 2250 - 2450 / day Protein: 115 - 130 g Fluid: > 2.2 L / day  New goal TPN rate is 95 ml/hr (provides 125 g protein, 64 g lipids, and 360 g dextrose, 2365 kcal meeting 100% of needs)  Current Nutrition: Regular diet- 0% TPN  Plan:  Advance to new TPN goal rate of 95 mL/hr.  This TPN provides 125 g protein, 64 g lipids, and 360 g dextrose, 2365 kcal meeting 100% of needs  Electrolytes in TPN: Continue as previous; increase K. DP:TELMRAJ 1:1.  Continue MVI, thiamine in TPN Continue trace elements MWF only due to shortage  KCl IV 10 mEq x 2  Monitor TPN labs, bmet in am Possible transfer 08/04/2018  Levester Fresh, PharmD, BCPS, BCCCP Clinical Pharmacist [phone]  Please check AMION for all [place] numbers  08/01/2018 7:30 AM

## 2018-08-01 NOTE — Progress Notes (Addendum)
Messaged [facility] per persisting nausea.  According to [facility], med not available.  Asking for further nausea meds.  MD responded with new meds on MAR.

## 2018-08-01 NOTE — Progress Notes (Signed)
PROGRESS NOTE    Brandon Landry  YSA:[id number] DOB: 1965-05-03 DOA: 07/19/2018 PCP: Patient, No Pcp Per    Brief Narrative:   Patient is a30 y.o.malewho was hospitalized from 5/4 to 5/6 after he was found to have severe microcytic anemia with a hemoglobin of 3.1, he was transfused PRBC, CT scan of the abdomen on 5/4 showed a large fungating mass in the ascending colon with contained perforation and adjacent abscess formation, general surgery was subsequently consulted-surgical intervention was contemplated-however patient refused and was subsequently discharged home on 5/6. He returned [facility] on 5/16 with generalized weakness, and was found to have a hemoglobin of3.2.He was transfused 2 units of PRBC, and transferred to [facility]. He now wants to pursue surgical options.  General surgery was consulted, cannot undergo surgery due to severe malnutrition, patient refuses to eat American food. Palliative care was consulted, currently working with patient and family regarding goals of care. PICC line has been placed and patient has been started on TPN for nourishment. Patient now wishes to go to [place] for all of his medical care, being investigated by palliative medicine. 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 No fever or abdominal pain. Repeat CT scan showed fungating mass with ascites. General surgery has seen the patient and there is no plan for surgery at this time since the patient has refused surgical intervention here and wishes to go to [place], and furthermore he is severely malnourished.   --Continues with mild leukocytosis, WBC count 13.8. --continue empiric antibiotics with Zosyn --Family trying to arrange air transportation with "[facility] [place]".  Possible tentative transfer date out of [place] on 08/04/2018. --Grim prognosis especially in the setting of refusal of interventions --supportive care  Severe microcytic anemia, present on admission Etiology likely secondary to colon cancer.  Initial hemoglobin of 3.2.  Patient received 2 units of packed RBC at [facility] followed by 6 units of PRBC in our hospital. --Hgb 7.9-->7.4-->4.0-->8.0-->7.0 today --Transfuse 1 unit PRBCs today --Continue monitor hemoglobin daily, will transfuse for hemoglobin less than 7.0  Left arm weakness, bilateral strokes MRI brain showed numerous punctate foci of acute/early subacute infarctions in the right greater than left posterior cerebral hemispheres, right cerebellar hemispheres, no enhancing metastasis. CT chest showed small bilateral pleural effusions with mural filling defect along the left right lateral wall of ascending thoracic aorta concerning for thrombus. CTA aorta showed several areas of mural thickening and intimal irregularity noted in the ascending aorta favoring infectious vegetation/thrombus. 2D echo showed EF of 40 to 45% with low flow state LV, recommend rule out thrombus.  -Neurology following, not on antithrombotic agents given severe anemia. Not a candidate for anticoagulation unless anemia and bleeding source is secured. However patient has refused  surgery for the fungating colon mass.  Ascending  aorta vegetation/thrombus Likely related to advanced metastatic colon cancer, not a candidate for anticoagulation due to severe anemia and refusal for the surgery or further management. Recommended palliative and hospice at this time.  Left arm swelling Doppler studies negative for DVT  Hypokalemia Potassium 3.4 today, magnesium 2.1. --Replete potassium today --Repeat electrolytes in a.m. to include magnesium  Severe protein calorie malnutrition, hypoalbuminemia Continue TNA.  Has poor appetite, refuses "American food"   DVT prophylaxis: SCDs Code Status: Full code Family Communication: None Disposition Plan:  Overall poor prognosis. Unfortunately no local support due to recent divorce and immediate family in [place] and [place]. Representative from [place] has been following, patient will be transported with the medical jet to [place], possibly on 08/04/2018 out of [place], [place] per case management notes.  Consultants:   General surgery  Palliative care  Neurology  Procedures:   none  Antimicrobials:  Zosyn 5/17>>>   Subjective: Patient seen and examined at bedside, complaining of some abdominal discomfort this morning.  Hemoglobin dropped once again to 7.0 with low potassium.  Denies headache, no fever/chills/night sweats, no nausea/vomiting/diarrhea, no cough/congestion, no chest pain, no palpitations.  No acute events overnight per nursing staff.  Objective: Vitals:   08/01/18 1031 08/01/18 1100 08/01/18 1200 08/01/18 1226  BP: 103/77 107/79 108/79 114/81  Pulse: 89 85 82 89  Resp:      Temp:   98.2 F (36.8 C) 98.2 F (36.8 C)  TempSrc:   Oral Oral  SpO2: 100%   100%  Weight:      Height:        Intake/Output Summary (Last 24 hours) at 08/01/2018 1426 Last data filed at 08/01/2018 1228 Gross per 24 hour  Intake 2214.28 ml  Output 1150 ml  Net 1064.28 ml   Filed Weights   07/30/18 0500 07/31/18 0433 08/01/18 0505  Weight: 46.1 kg 46 kg 47.4  kg    Examination:  General exam: Appears calm and comfortable, cachectic in appearance, language barrier but able to answer simple questions appropriately Respiratory system: Clear to auscultation. Respiratory effort normal. Cardiovascular system: S1 & S2 heard, RRR. No JVD, murmurs, rubs, gallops or clicks. No pedal edema. Gastrointestinal system: Abdomen is nondistended, soft and nontender. No organomegaly or masses felt. Normal bowel sounds heard. Central nervous system: Alert and oriented. No focal neurological deficits. Extremities: No peripheral edema Skin: No rashes, lesions or ulcers Psychiatry: Judgement and insight appear normal. Mood & affect appropriate.     Data Reviewed: I have personally reviewed following labs and imaging studies  CBC: Recent Labs  Lab 07/26/18 0427  07/27/18 0357 07/27/18 1108 07/28/18 0338 07/30/18 0353 07/31/18 0518 08/01/18 0446  WBC 14.7*  --  14.2*  --  13.7* 13.8*  --  12.5*  NEUTROABS  --   --  11.5*  --   --   --   --   --   HGB 5.7*   < > 6.5* 7.9* 7.4* 4.0* 8.0* 7.0*  HCT 18.3*   < > 20.6* 22.9* 22.6* 12.9* 24.0* 21.6*  MCV 88.0  --  88.4  --  88.3 93.5  --  87.8  PLT 347  --  343  --  379 330  --  334   < > = values in this interval not displayed.   Basic Metabolic Panel: Recent Labs  Lab 07/26/18 0427 07/27/18 0357 07/28/18 0338 07/30/18 0353 08/01/18 0446  NA 131* 133*  137 140 140  K 3.9 3.9 3.8 3.8 3.4*  CL 102 105 109 112* 108  CO2 23 22 25 22 23   GLUCOSE 101* 104* 110* 107* 106*  BUN 17 17 17  23* 20  CREATININE <0.30* <0.30* <0.30* 0.40* 0.36*  CALCIUM 7.2* 7.5* 7.7* 7.5* 8.0*  MG 2.0 2.1  --  2.0 2.1  PHOS 3.0 3.1  --  3.1  --    GFR: Estimated Creatinine Clearance: 71.6 mL/min (A) (by C-G formula based on SCr of 0.36 mg/dL (L)). Liver Function Tests: Recent Labs  Lab 07/27/18 0357 07/30/18 0353  AST 27 33  ALT 26 35  ALKPHOS 64 66  BILITOT 0.5 0.3  PROT 5.1* 4.6*  ALBUMIN 1.2* 1.3*   No results  for input(s): LIPASE, AMYLASE in the last 168 hours. No results for input(s): AMMONIA in the last 168 hours. Coagulation Profile: No results for input(s): INR, PROTIME in the last 168 hours. Cardiac Enzymes: No results for input(s): CKTOTAL, CKMB, CKMBINDEX, TROPONINI in the last 168 hours. BNP (last 3 results) No results for input(s): PROBNP in the last 8760 hours. HbA1C: No results for input(s): HGBA1C in the last 72 hours. CBG: Recent Labs  Lab 07/27/18 0811 07/28/18 0808 07/30/18 0553 07/31/18 0755 08/01/18 0839  GLUCAP 115* 105* 106* 103* 101*   Lipid Profile: No results for input(s): CHOL, HDL, LDLCALC, TRIG, CHOLHDL, LDLDIRECT in the last 72 hours. Thyroid Function Tests: No results for input(s): TSH, T4TOTAL, FREET4, T3FREE, THYROIDAB in the last 72 hours. Anemia Panel: No results for input(s): VITAMINB12, FOLATE, FERRITIN, TIBC, IRON, RETICCTPCT in the last 72 hours. Sepsis Labs: No results for input(s): PROCALCITON, LATICACIDVEN in the last 168 hours.  Recent Results (from the past 240 hour(s))  Urine Culture     Status: None   Collection Time: 07/27/18  9:49 AM  Result Value Ref Range Status   Specimen Description URINE, RANDOM  Final   Special Requests NONE  Final   Culture   Final    NO GROWTH Performed at [facility], [address] [place], [place] [address]    Report Status 07/28/2018 FINAL  Final         Radiology Studies: Dg Abd 1 View  Result Date: 08/01/2018 CLINICAL DATA:  Nausea, vomiting, and constipation for 3 days. Right lower quadrant pain. EXAM: ABDOMEN - 1 VIEW COMPARISON:  07/27/2018 CT abdomen FINDINGS: Gas-filled stomach and colon are noted. There is no disproportionate dilatation of bowel. There is no obvious free intraperitoneal gas. Small calculus projects over the lower pole of the left kidney. IMPRESSION: Nonobstructive bowel gas pattern. Left nephrolithiasis. Electronically Signed   By: Marybelle Killings M.D.   On: 08/01/2018  12:09        Scheduled Meds: . sodium chloride   Intravenous Once  . Chlorhexidine Gluconate Cloth  6 each Topical Q0600  . nicotine  21 mg Transdermal Daily   Continuous Infusions: . sodium chloride Stopped (08/01/18 0854)  . piperacillin-tazobactam (ZOSYN)  IV 3.375 g (08/01/18 1019)  . potassium chloride 10 mEq (08/01/18 1355)  . TPN ADULT (ION) 75 mL/hr at 08/01/18 1000  . TPN ADULT (ION)       LOS: 13 days    Time spent: 31 minutes    Eric J [place] DO [facility] Pager [phone]  If 7PM-7AM, please contact night-coverage [email or web address] Password [email or web address] 08/01/2018, 2:26 PM

## 2018-08-01 NOTE — Progress Notes (Addendum)
Messaged [facility]   5w 16 emesis x1 since 19:25 black with coffee grounds, diarrhea large x1 brown .  Zofran given at 6pm, can he have anything else for nausea.  Awaiting MD response.

## 2018-08-02 LAB — TYPE AND SCREEN
ABO/RH(D): B POS
Antibody Screen: NEGATIVE
Unit division: 0
Unit division: 0
Unit division: 0
Unit division: 0

## 2018-08-02 LAB — BASIC METABOLIC PANEL
Anion gap: 7 (ref 5–15)
BUN: 18 mg/dL (ref 6–20)
CO2: 26 mmol/L (ref 22–32)
Calcium: 7.9 mg/dL — ABNORMAL LOW (ref 8.9–10.3)
Chloride: 109 mmol/L (ref 98–111)
Creatinine, Ser: 0.32 mg/dL — ABNORMAL LOW (ref 0.61–1.24)
GFR calc Af Amer: >60 mL/min (ref >60)
GFR calc non Af Amer: >60 mL/min (ref >60)
Glucose, Bld: 126 mg/dL — ABNORMAL HIGH (ref 70–99)
Potassium: 3.8 mmol/L (ref 3.5–5.1)
Sodium: 142 mmol/L (ref 135–145)

## 2018-08-02 LAB — CBC
HCT: 22.9 % — ABNORMAL LOW (ref 39.0–52.0)
Hemoglobin: 7.7 g/dL — ABNORMAL LOW (ref 13.0–17.0)
MCH: 29.6 pg (ref 26.0–34.0)
MCHC: 33.6 g/dL (ref 30.0–36.0)
MCV: 88.1 fL (ref 80.0–100.0)
Platelets: 340 10*3/uL (ref 150–400)
RBC: 2.6 MIL/uL — ABNORMAL LOW (ref 4.22–5.81)
RDW: 16.3 % — ABNORMAL HIGH (ref 11.5–15.5)
WBC: 13 10*3/uL — ABNORMAL HIGH (ref 4.0–10.5)
nRBC: 0 % (ref 0.0–0.2)

## 2018-08-02 LAB — BPAM RBC
Blood Product Expiration Date: 202006052359
Blood Product Expiration Date: 202006102359
Blood Product Expiration Date: 202006102359
Blood Product Expiration Date: 202006152359
ISSUE DATE / TIME: 202005281127
ISSUE DATE / TIME: 202005281551
ISSUE DATE / TIME: 202005290001
ISSUE DATE / TIME: 202005300917
Unit Type and Rh: 7300
Unit Type and Rh: 7300
Unit Type and Rh: 7300
Unit Type and Rh: 7300

## 2018-08-02 LAB — MAGNESIUM: Magnesium: 2.1 mg/dL (ref 1.7–2.4)

## 2018-08-02 LAB — GLUCOSE, CAPILLARY: Glucose-Capillary: 113 mg/dL — ABNORMAL HIGH (ref 70–99)

## 2018-08-02 MED ORDER — TRAVASOL 10 % IV SOLN
INTRAVENOUS | Status: AC
  Administered 2018-08-02: 18:00:00 via INTRAVENOUS
  Filled 2018-08-02: qty 1254

## 2018-08-02 NOTE — Progress Notes (Signed)
PHARMACY - ADULT TOTAL PARENTERAL NUTRITION CONSULT NOTE   Pharmacy Consult for TPN Indication: Severe malnutrition; R-colon mass extending into duodenum   Patient Measurements: Height: 5' 8.11" (173 cm) Weight: 104 lb 8 oz (47.4 kg) IBW/kg (Calculated) : 68.65   Body mass index is 15.84 kg/m.   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 He is very malnourished and refuses to eat (only wants [place]). He is ok with TPN and needs improved nutrition if going to have surgery. Palliative care on board.   GI: Pre-albumin <5>>7.3. LBM 5/30; emesis reported overnight Endo: CBGs < 140s with TPN. No SSI Insulin requirements in the past 24 hours: 0 units Lytes: K 3.8, Mg 2.1 Renal: SCr 0.32 UOP 1 mL/kg/hr Pulm: no issues - RA Cards: VSS Hepatobil: LFT/Tbili/TG within normal limits.  Neuro: pain 0-2. Got high dose Thiamine, now with 100mg  daily in TPN. ID: WBC 13 on Zosyn. Afebrile. PCT 0.10. Heme: Hgb 4 >>8 post transfusion  TPN Access: PICC  TPN start date: 07/21/18 Nutritional Goals (per RD recommendation on 5/30): KCal: 2250 - 2450 / day Protein: 115 - 130 g Fluid: > 2.2 L / day  Goal TPN rate is 95 ml/hr (provides 125 g protein, 64 g lipids, and 360 g dextrose, 2365 kcal meeting 100% of needs)  Current Nutrition: Regular diet- 0% TPN  Plan:  Continue TPN at goal rate of 95 mL/hr.  This TPN provides 125 g protein, 64 g lipids, and 360 g dextrose, 2365 kcal meeting 100% of needs  Electrolytes in TPN: Continue as previous; increase K. OY:DXAJOIN 1:1.  Continue MVI, thiamine in TPN Continue trace elements MWF only due to shortage  Monitor TPN labs Possible transfer to [facility] 08/04/2018  Levester Fresh, PharmD, BCPS, BCCCP Clinical Pharmacist [phone]  Please check AMION for all [place] numbers  08/02/2018 7:25 AM

## 2018-08-02 NOTE — Progress Notes (Signed)
PROGRESS NOTE    Brandon Landry  PIR:[id number] DOB: 1965-11-20 DOA: 07/19/2018 PCP: Patient, No Pcp Per    Brief Narrative:   Patient is a16 y.o.malewho was hospitalized from 5/4 to 5/6 after he was found to have severe microcytic anemia with a hemoglobin of 3.1, he was transfused PRBC, CT scan of the abdomen on 5/4 showed a large fungating mass in the ascending colon with contained perforation and adjacent abscess formation, general surgery was subsequently consulted-surgical intervention was contemplated-however patient refused and was subsequently discharged home on 5/6. He returned [facility] on 5/16 with generalized weakness, and was found to have a hemoglobin of3.2.He was transfused 2 units of PRBC, and transferred to [facility]. He now wants to pursue surgical options.  General surgery was consulted, cannot undergo surgery due to severe malnutrition, patient refuses to eat American food. Palliative care was consulted, currently working with patient and family regarding goals of care. PICC line has been placed and patient has been started on TPN for nourishment. Patient now wishes to go to [place] for all of his medical care, being investigated by palliative medicine. 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 No fever or abdominal pain. Repeat CT scan showed fungating mass with ascites. General surgery has seen the patient and there is no plan for surgery at this time since the patient has refused surgical intervention here and wishes to go to [place], and furthermore he is severely malnourished.   --Continues with mild leukocytosis, WBC count 13.0 --continue empiric antibiotics with Zosyn --Pain control with IV morphine prn --Antiemetics PRN --Family trying to arrange air transportation with "[facility] [place]".  Possible tentative transfer date out of [place] on 08/04/2018. --Grim prognosis especially in the setting of refusal of interventions --supportive care  Severe microcytic anemia, present on admission Etiology likely secondary to colon cancer.  Initial hemoglobin of 3.2.  Patient received 2 units of packed RBC at [facility] followed by 7 units of pRBC's at [facility] --Hgb 7.9-->7.4-->4.0-->8.0-->7.0-->7.7 today --Continue monitor hemoglobin daily, will transfuse for hemoglobin less than 7.0  Left arm weakness, bilateral strokes MRI brain showed numerous punctate foci of acute/early subacute infarctions in the right greater than left posterior cerebral hemispheres, right cerebellar hemispheres, no enhancing metastasis. CT chest showed small bilateral pleural effusions with mural filling defect along the left right lateral wall of ascending thoracic aorta concerning for thrombus. CTA aorta showed several areas of mural thickening and intimal irregularity noted in the ascending aorta favoring infectious vegetation/thrombus. 2D echo showed EF of 40 to 45% with low flow state LV, recommend rule out thrombus.  -Neurology following, not on antithrombotic agents given severe anemia. Not a candidate for anticoagulation unless anemia and bleeding source is secured. However patient  has refused surgery for the fungating colon  mass.  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 Recommended palliative and hospice at this time.  Left arm swelling Doppler studies negative for DVT  Hypokalemia Potassium 3.4 today, magnesium 2.1. --Replete potassium today --Repeat electrolytes in a.m. to include magnesium  Severe protein calorie malnutrition, hypoalbuminemia Continue TNA.  Has poor appetite, refuses "American food"   DVT prophylaxis: SCDs Code Status: Full code Family Communication: None Disposition Plan:  Overall poor prognosis. Unfortunately no local support due to recent divorce and immediate family in [place] and [place]. Representative from [place] has been following, patient will be transported with the medical jet to [place], possibly on 08/04/2018 out of [place], [place] per case management notes.  Consultants:   General surgery  Palliative care  Neurology  Procedures:   none  Antimicrobials:  Zosyn 5/17>>>   Subjective: Patient with significant nausea and vomiting overnight, improved with antiemetics.  Also with increased pain, controlled with IV morphine.  This morning appears comfortable in no acute distress.  No complaints this morning.  Hemoglobin 7.7, up from 7.0 yesterday following blood transfusion of 1 unit PRBC.  Denies headache, no fever/chills/night sweats, no nausea/vomiting/diarrhea, no cough/congestion, no chest pain, no palpitations.  No acute events overnight per nursing staff.  Objective: Vitals:   08/01/18 1031 08/01/18 1100 08/01/18 1200 08/01/18 1226  BP: 103/77 107/79 108/79 114/81  Pulse: 89 85 82 89  Resp:      Temp:   98.2 F (36.8 C) 98.2 F (36.8 C)  TempSrc:   Oral Oral  SpO2: 100%   100%  Weight:      Height:        Intake/Output Summary (Last 24 hours) at 08/02/2018 1302 Last data filed at  08/02/2018 1104 Gross per 24 hour  Intake 2293.99 ml  Output 1128 ml  Net 1165.99 ml   Filed Weights   07/30/18 0500 07/31/18 0433 08/01/18 0505  Weight: 46.1 kg 46 kg 47.4 kg    Examination:  General exam: Appears calm and comfortable, cachectic in appearance, language barrier but able to answer simple questions appropriately Respiratory system: Clear to auscultation. Respiratory effort normal. Cardiovascular system: S1 & S2 heard, RRR. No JVD, murmurs, rubs, gallops or clicks. No pedal edema. Gastrointestinal system: Abdomen is nondistended, soft and nontender. No organomegaly or masses felt. Normal bowel sounds heard. Central nervous system: Alert and oriented. No focal neurological deficits. Extremities: No peripheral edema Skin: No rashes, lesions or ulcers Psychiatry: Judgement and insight appear normal. Mood & affect appropriate.     Data Reviewed: I have personally reviewed following labs and imaging studies  CBC: Recent Labs  Lab 07/27/18 0357  07/28/18 0338 07/30/18 0353 07/31/18 0518 08/01/18 0446 08/01/18 1519 08/02/18 0419  WBC 14.2*  --  13.7* 13.8*  --  12.5*  --  13.0*  NEUTROABS 11.5*  --   --   --   --   --   --   --   HGB 6.5*   < > 7.4* 4.0* 8.0* 7.0* 7.9* 7.7*  HCT 20.6*   < > 22.6* 12.9* 24.0* 21.6* 23.6* 22.9*  MCV 88.4  --  88.3 93.5  --  87.8  --  88.1  PLT 343  --  379 330  --  334  --  340   < > = values in this  interval not displayed.   Basic Metabolic Panel: Recent Labs  Lab 07/27/18 0357 07/28/18 0338 07/30/18 0353 08/01/18 0446 08/02/18 0419  NA 133* 137 140 140 142  K 3.9 3.8 3.8 3.4* 3.8  CL 105 109 112* 108 109  CO2 22 25 22 23 26   GLUCOSE 104* 110* 107* 106* 126*  BUN 17 17 23* 20 18  CREATININE <0.30* <0.30* 0.40* 0.36* 0.32*  CALCIUM 7.5* 7.7* 7.5* 8.0* 7.9*  MG 2.1  --  2.0 2.1 2.1  PHOS 3.1  --  3.1  --   --    GFR: Estimated Creatinine Clearance: 71.6 mL/min (A) (by C-G formula based on SCr of 0.32 mg/dL (L)).  Liver Function Tests: Recent Labs  Lab 07/27/18 0357 07/30/18 0353  AST 27 33  ALT 26 35  ALKPHOS 64 66  BILITOT 0.5 0.3  PROT 5.1* 4.6*  ALBUMIN 1.2* 1.3*   No results for input(s): LIPASE, AMYLASE in the last 168 hours. No results for input(s): AMMONIA in the last 168 hours. Coagulation Profile: No results for input(s): INR, PROTIME in the last 168 hours. Cardiac Enzymes: No results for input(s): CKTOTAL, CKMB, CKMBINDEX, TROPONINI in the last 168 hours. BNP (last 3 results) No results for input(s): PROBNP in the last 8760 hours. HbA1C: No results for input(s): HGBA1C in the last 72 hours. CBG: Recent Labs  Lab 07/28/18 0808 07/30/18 0553 07/31/18 0755 08/01/18 0839 08/02/18 0715  GLUCAP 105* 106* 103* 101* 113*   Lipid Profile: No results for input(s): CHOL, HDL, LDLCALC, TRIG, CHOLHDL, LDLDIRECT in the last 72 hours. Thyroid Function Tests: No results for input(s): TSH, T4TOTAL, FREET4, T3FREE, THYROIDAB in the last 72 hours. Anemia Panel: No results for input(s): VITAMINB12, FOLATE, FERRITIN, TIBC, IRON, RETICCTPCT in the last 72 hours. Sepsis Labs: No results for input(s): PROCALCITON, LATICACIDVEN in the last 168 hours.  Recent Results (from the past 240 hour(s))  Urine Culture     Status: None   Collection Time: 07/27/18  9:49 AM  Result Value Ref Range Status   Specimen Description URINE, RANDOM  Final   Special Requests NONE  Final   Culture   Final    NO GROWTH Performed at [facility], [address], [place], [place] [address]    Report Status 07/28/2018 FINAL  Final         Radiology Studies: Dg Abd 1 View  Result Date: 08/01/2018 CLINICAL DATA:  Nausea, vomiting, and constipation for 3 days. Right lower quadrant pain. EXAM: ABDOMEN - 1 VIEW COMPARISON:  07/27/2018 CT abdomen FINDINGS: Gas-filled stomach and colon are noted. There is no disproportionate dilatation of bowel. There is no obvious free intraperitoneal gas. Small calculus  projects over the lower pole of the left kidney. IMPRESSION: Nonobstructive bowel gas pattern. Left nephrolithiasis. Electronically Signed   By: Marybelle Killings M.D.   On: 08/01/2018 12:09        Scheduled Meds: . Chlorhexidine Gluconate Cloth  6 each Topical Q0600  . metoCLOPramide (REGLAN) injection  10 mg Intravenous Once  . nicotine  21 mg Transdermal Daily   Continuous Infusions: . sodium chloride Stopped (08/01/18 1645)  . piperacillin-tazobactam (ZOSYN)  IV 3.375 g (08/02/18 1041)  . TPN ADULT (ION) 95 mL/hr at 08/02/18 1125  . TPN ADULT (ION)       LOS: 14 days    Time spent: 30 minutes    Jeani Fassnacht J [place] DO [facility] Pager [phone]  If 7PM-7AM, please contact night-coverage [email or web address] Password  [email or web address] 08/02/2018, 1:02 PM

## 2018-08-03 LAB — DIFFERENTIAL
Abs Immature Granulocytes: 0.06 10*3/uL (ref 0.00–0.07)
Basophils Absolute: 0 10*3/uL (ref 0.0–0.1)
Basophils Relative: 0 %
Eosinophils Absolute: 0.2 10*3/uL (ref 0.0–0.5)
Eosinophils Relative: 1 %
Immature Granulocytes: 0 %
Lymphocytes Relative: 5 %
Lymphs Abs: 0.6 10*3/uL — ABNORMAL LOW (ref 0.7–4.0)
Monocytes Absolute: 0.9 10*3/uL (ref 0.1–1.0)
Monocytes Relative: 7 %
Neutro Abs: 12.2 10*3/uL — ABNORMAL HIGH (ref 1.7–7.7)
Neutrophils Relative %: 87 %

## 2018-08-03 LAB — COMPREHENSIVE METABOLIC PANEL
ALT: 50 U/L — ABNORMAL HIGH (ref 0–44)
AST: 35 U/L (ref 15–41)
Albumin: 1.7 g/dL — ABNORMAL LOW (ref 3.5–5.0)
Alkaline Phosphatase: 113 U/L (ref 38–126)
Anion gap: 8 (ref 5–15)
BUN: 21 mg/dL — ABNORMAL HIGH (ref 6–20)
CO2: 28 mmol/L (ref 22–32)
Calcium: 8.2 mg/dL — ABNORMAL LOW (ref 8.9–10.3)
Chloride: 110 mmol/L (ref 98–111)
Creatinine, Ser: 0.36 mg/dL — ABNORMAL LOW (ref 0.61–1.24)
GFR calc Af Amer: >60 mL/min (ref >60)
GFR calc non Af Amer: >60 mL/min (ref >60)
Glucose, Bld: 145 mg/dL — ABNORMAL HIGH (ref 70–99)
Potassium: 3.6 mmol/L (ref 3.5–5.1)
Sodium: 146 mmol/L — ABNORMAL HIGH (ref 135–145)
Total Bilirubin: 0.6 mg/dL (ref 0.3–1.2)
Total Protein: 5.6 g/dL — ABNORMAL LOW (ref 6.5–8.1)

## 2018-08-03 LAB — CBC
HCT: 22.5 % — ABNORMAL LOW (ref 39.0–52.0)
Hemoglobin: 7.2 g/dL — ABNORMAL LOW (ref 13.0–17.0)
MCH: 29.1 pg (ref 26.0–34.0)
MCHC: 32 g/dL (ref 30.0–36.0)
MCV: 91.1 fL (ref 80.0–100.0)
Platelets: 389 10*3/uL (ref 150–400)
RBC: 2.47 MIL/uL — ABNORMAL LOW (ref 4.22–5.81)
RDW: 16.4 % — ABNORMAL HIGH (ref 11.5–15.5)
WBC: 13.9 10*3/uL — ABNORMAL HIGH (ref 4.0–10.5)
nRBC: 0 % (ref 0.0–0.2)

## 2018-08-03 LAB — HEMOGLOBIN AND HEMATOCRIT, BLOOD
HCT: 29.7 % — ABNORMAL LOW (ref 39.0–52.0)
Hemoglobin: 9.6 g/dL — ABNORMAL LOW (ref 13.0–17.0)

## 2018-08-03 LAB — MAGNESIUM: Magnesium: 2.3 mg/dL (ref 1.7–2.4)

## 2018-08-03 LAB — TRIGLYCERIDES: Triglycerides: 83 mg/dL (ref <150)

## 2018-08-03 LAB — PHOSPHORUS: Phosphorus: 3.7 mg/dL (ref 2.5–4.6)

## 2018-08-03 LAB — PREALBUMIN: Prealbumin: 14.1 mg/dL — ABNORMAL LOW (ref 18–38)

## 2018-08-03 LAB — GLUCOSE, CAPILLARY: Glucose-Capillary: 132 mg/dL — ABNORMAL HIGH (ref 70–99)

## 2018-08-03 LAB — PREPARE RBC (CROSSMATCH)

## 2018-08-03 MED ORDER — SIMETHICONE 80 MG PO CHEW
80.0000 mg | CHEWABLE_TABLET | Freq: Once | ORAL | Status: AC
  Administered 2018-08-03: 80 mg via ORAL
  Filled 2018-08-03: qty 1

## 2018-08-03 MED ORDER — ALUM & MAG HYDROXIDE-SIMETH 200-200-20 MG/5ML PO SUSP
15.0000 mL | Freq: Once | ORAL | Status: DC
  Filled 2018-08-03: qty 30

## 2018-08-03 MED ORDER — TRAVASOL 10 % IV SOLN
INTRAVENOUS | Status: AC
  Administered 2018-08-03: 17:00:00 via INTRAVENOUS
  Filled 2018-08-03: qty 1254

## 2018-08-03 MED ORDER — SODIUM CHLORIDE 0.9% IV SOLUTION
Freq: Once | INTRAVENOUS | Status: AC
  Administered 2018-08-03: 09:00:00 via INTRAVENOUS

## 2018-08-03 MED ORDER — POTASSIUM CHLORIDE 10 MEQ/100ML IV SOLN
10.0000 meq | INTRAVENOUS | Status: AC
  Administered 2018-08-03 (×4): 10 meq via INTRAVENOUS
  Filled 2018-08-03 (×4): qty 100

## 2018-08-03 MED ORDER — SIMETHICONE 80 MG PO CHEW
160.0000 mg | CHEWABLE_TABLET | Freq: Once | ORAL | Status: AC
  Administered 2018-08-03: 22:00:00 160 mg via ORAL
  Filled 2018-08-03: qty 2

## 2018-08-03 NOTE — Progress Notes (Signed)
PHARMACY - ADULT TOTAL PARENTERAL NUTRITION CONSULT NOTE   Pharmacy Consult for TPN Indication: Severe malnutrition; R-colon mass extending into duodenum   Patient Measurements: Height: 5' 8.11" (173 cm) Weight: 95 lb 0.3 oz (43.1 kg) IBW/kg (Calculated) : 68.65   Body mass index is 14.4 kg/m.   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 He is very malnourished and refuses to eat (only wants [place]). He is ok with TPN and needs improved nutrition if going to have surgery. Palliative care on board.   GI: Pre-albumin <5>>7.3>>14.1. LBM 5/30; coffee-ground emesis x4 reported overnight (Hgb down 7.2).  Endo: CBGs 113-145 with TPN. No SSI Insulin requirements in the past 24 hours: 0 units Lytes: Na up 146, K 3.6 (4 runs ordered), Mg & Phos wnl; Corr Ca 10 (alb 1.7) Renal: SCr 0.32 UOP 1.1 mL/kg/hr Pulm: no issues - RA Cards: VSS Hepatobil: ALT up 50, AST/AlkPhos/Tbili/TG within normal limits.  Neuro: pain 0-2. Got high dose Thiamine, now with 100mg  daily in TPN. ID: WBC 13.9 on Zosyn. Afebrile. PCT 0.10. Heme: Hgb 4 >>8 post transfusion  TPN Access: PICC  TPN start date: 07/21/18 Nutritional Goals (per RD recommendation on 5/29): KCal: 2250 - 2450 / day Protein: 115 - 130 g Fluid: > 2.2 L / day  Goal TPN rate is 95 ml/hr (provides 125 g protein, 64 g lipids, and 360 g dextrose, 2365 kcal meeting 100% of needs)  Current Nutrition: Regular diet- 0% TPN  Plan:  Continue TPN at goal rate of 95 mL/hr.  This TPN provides 125 g protein, 64 g lipids, and 360 g dextrose, 2365 kcal meeting 100% of needs  Electrolytes in TPN: Reduce Na, continue increased K, others no change. QP:YPPJKDT 1:1.  Continue MVI, thiamine in TPN Continue trace elements MWF only due to shortage  Monitor TPN labs Possible transfer to [facility] 08/04/2018  Sloan Leiter, PharmD, BCPS, BCCCP Clinical Pharmacist Clinical phone  08/03/2018 until 3:30PM - #[phone] Please check AMION for all [place] numbers  08/03/2018 9:24 AM

## 2018-08-03 NOTE — TOC Progression Note (Addendum)
Transition of Care [facility]) - Progression Note    Patient Details  Name: Brandon Landry MRN: [id number] Date of Birth: May 04, 1965  Transition of [facility] CM/SW [place], LCSW Phone Number: 08/03/2018, 9:49 AM  Clinical Narrative:    CSW received call and email from patient's nephew requesting that we ambulance patient home to look for his passport and then return him to the hospital. CSW explained that the hospital is unable to do so and that if patient wants to return home he would have to leave AMA and return to the hospital through the ED. CSW spoke with APS worker, [name], who stated he has plans to meet with patient's ex-wife at patient's request to look for the passport in his apartment today. He will keep CSW updated.   Update: Patient's ex-wife located the passport and brought it to the patient. CSW spoke with her and patient's nephew on a three-way call and let them know that at this point we are just waiting on the logistics to be worked out by [employer] [facility] [place].    Expected Discharge Plan: [place] Barriers to Discharge: Continued Medical Work up  Expected Discharge Plan and Services Expected Discharge Plan: [place] In-house Referral: Clinical Social Work, Hospice / [place] arrangements for the past 2 months: Apartment                 DME Arranged: N/A DME Agency: NA       HH Arranged: NA HH Agency: NA         Social Determinants of Health (SDOH) Interventions    Readmission Risk Interventions Readmission Risk Prevention Plan 07/07/2018  Transportation Screening Complete

## 2018-08-03 NOTE — Progress Notes (Signed)
Pt c/o being bloated, on assessment very uncomfortable. NP on call notified. New medication ordered.

## 2018-08-03 NOTE — Progress Notes (Signed)
PT Cancellation Note  Patient Details Name: Brandon Landry MRN: [id number] DOB: [birth year]   Cancelled Treatment:    Reason Eval/Treat Not Completed: Patient declined, no reason specified Patient declined participating in PT at this time due to c/o pain and nausea. PT will continue to follow acutely.    Earney Navy, PTA Acute Rehabilitation Services Pager: [phone] Office: [phone]   08/03/2018, 3:26 PM

## 2018-08-03 NOTE — Progress Notes (Signed)
PROGRESS NOTE    Brandon Landry  IRW:[id number] DOB: 31-Oct-1965 DOA: 07/19/2018 PCP: Patient, No Pcp Per    Brief Narrative:   Patient is a59 y.o.malewho was hospitalized from 5/4 to 5/6 after he was found to have severe microcytic anemia with a hemoglobin of 3.1, he was transfused PRBC, CT scan of the abdomen on 5/4 showed a large fungating mass in the ascending colon with contained perforation and adjacent abscess formation, general surgery was subsequently consulted-surgical intervention was contemplated-however patient refused and was subsequently discharged home on 5/6. He returned [facility] on 5/16 with generalized weakness, and was found to have a hemoglobin of3.2.He was transfused 2 units of PRBC, and transferred to [facility]. He now wants to pursue surgical options.  General surgery was consulted, cannot undergo surgery due to severe malnutrition, patient refuses to eat American food. Palliative care was consulted, currently working with patient and family regarding goals of care. PICC line has been placed and patient has been started on TPN for nourishment. Patient now wishes to go to [place] for all of his medical care, being investigated by palliative medicine. 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 No fever or abdominal pain. Repeat CT scan showed fungating mass with ascites. General surgery has seen the patient and there is no plan for surgery at this time since the patient has refused surgical intervention here and wishes to go to [place], and furthermore he is severely malnourished.   --Continues with mild leukocytosis, WBC count 13.0 --continue empiric antibiotics with Zosyn --Pain control with IV morphine prn --Antiemetics PRN --Family trying to arrange air transportation with [facility] [place]".  Possible tentative transfer date out of [place] on 08/04/2018. --Grim prognosis especially in the setting of refusal of interventions --supportive care  Severe microcytic anemia, present on admission Etiology likely secondary to colon cancer.  Initial hemoglobin of 3.2.  Patient received 2 units of packed RBC at [facility] followed by 7 units of pRBC's at [facility]. --Hgb 7.9-->7.4-->4.0-->8.0-->7.0-->7.7-->7.2 today --We will transfuse 2 units PRBCs today in anticipation of possible transfer out of country tomorrow --Continue monitor hemoglobin daily, will transfuse for hemoglobin less than 7.0  Left arm weakness, bilateral strokes MRI brain showed numerous punctate foci of acute/early subacute infarctions in the right greater than left posterior cerebral hemispheres, right cerebellar hemispheres, no enhancing metastasis. CT chest showed small bilateral pleural effusions with mural filling defect along the left right lateral wall of ascending thoracic aorta concerning for thrombus. CTA aorta showed several areas of mural thickening and intimal irregularity noted in the ascending aorta favoring infectious vegetation/thrombus. 2D echo showed EF of 40 to 45% with low flow state LV, recommend rule out thrombus.  -Neurology following, not on antithrombotic agents given  severe anemia. Not a candidate for anticoagulation  unless anemia and bleeding source is secured. However patient has refused surgery for the fungating colon mass.  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 Recommended palliative and hospice at this time.  Left arm swelling Doppler studies negative for DVT  Hypokalemia Potassium 3.6 today, magnesium 2.3. --Replete potassium today --Repeat electrolytes in a.m. to include magnesium  Severe protein calorie malnutrition, hypoalbuminemia Continue TNA.  Has poor appetite, refuses "American food"   DVT prophylaxis: SCDs Code Status: Full code Family Communication: Ex-wife Tonya at bedside Disposition Plan:  Overall poor prognosis. Unfortunately no local support due to recent divorce and immediate family in [place] and [place]. Representative from [place] has been following, patient will possibly discharge on 08/04/2018 out of [place], [place] per case management notes for further management in [place] per patient's wishes and family wishes.  Consultants:   General surgery  Palliative care  Neurology  Procedures:   none  Antimicrobials:  Zosyn 5/17>>>   Subjective: Patient resting comfortably in bed, had persistent nausea with reported coffee-ground emesis overnight.  Nausea controlled with Phenergan.  Ex-wife at bedside, updated and walk-through CT findings on computer to show how extensive his disease process is.  Extremely poor prognosis.  Denies headache, no fever/chills/night sweats, no diarrhea, no cough/congestion, no chest pain, no palpitations.  No acute events overnight per nursing staff.  Objective: Vitals:   08/03/18 0000 08/03/18 0500 08/03/18 1300 08/03/18 1320  BP: 105/71  118/81 127/84  Pulse: 92  86 95  Resp: 18  14 14   Temp: 98.1 F (36.7 C)  97.8 F (36.6 C) 98.4 F (36.9 C)  TempSrc: Oral   Axillary Oral  SpO2: 100%  100% 100%  Weight:  43.1 kg    Height:        Intake/Output Summary (Last 24 hours) at 08/03/2018 1434 Last data filed at 08/03/2018 1309 Gross per 24 hour  Intake 2919.15 ml  Output 1990 ml  Net 929.15 ml   Filed Weights   07/31/18 0433 08/01/18 0505 08/03/18 0500  Weight: 46 kg 47.4 kg 43.1 kg    Examination:  General exam: Appears calm and comfortable, cachectic in appearance, language barrier but able to answer simple questions appropriately Respiratory system: Clear to auscultation. Respiratory effort normal. Cardiovascular system: S1 & S2 heard, RRR. No JVD, murmurs, rubs, gallops or clicks. No pedal edema. Gastrointestinal system: Abdomen is scaphoid in appearance, nondistended, soft and nontender. No organomegaly or masses felt. Normal bowel sounds heard. Central nervous system: Alert and oriented. No focal neurological deficits. Extremities: No peripheral edema Skin: No rashes, lesions or ulcers Psychiatry: Judgement and insight appear normal. Mood & affect appropriate.     Data Reviewed: I have personally reviewed following labs and imaging studies  CBC: Recent Labs  Lab 07/28/18 0338 07/30/18 0353 07/31/18 0518 08/01/18 0446 08/01/18 1519 08/02/18 0419 08/03/18 0400  WBC 13.7* 13.8*  --  12.5*  --  13.0* 13.9*  NEUTROABS  --   --   --   --   --   --  12.2*  HGB 7.4* 4.0* 8.0* 7.0* 7.9* 7.7* 7.2*  HCT 22.6* 12.9* 24.0* 21.6* 23.6* 22.9* 22.5*  MCV 88.3 93.5  --  87.8  --  88.1 91.1  PLT 379 330  --  334  --  340 712   Basic Metabolic Panel:  Recent Labs  Lab 07/28/18 0338 07/30/18 0353 08/01/18 0446 08/02/18 0419 08/03/18 0400  NA 137 140 140 142 146*  K 3.8 3.8 3.4* 3.8 3.6  CL 109 112* 108 109 110  CO2 25 22 23 26 28   GLUCOSE 110* 107* 106* 126* 145*  BUN 17 23* 20 18 21*  CREATININE <0.30* 0.40* 0.36* 0.32* 0.36*  CALCIUM 7.7* 7.5* 8.0* 7.9* 8.2*  MG  --  2.0 2.1 2.1 2.3  PHOS  --  3.1  --   --  3.7   GFR:  Estimated Creatinine Clearance: 65.1 mL/min (A) (by C-G formula based on SCr of 0.36 mg/dL (L)). Liver Function Tests: Recent Labs  Lab 07/30/18 0353 08/03/18 0400  AST 33 35  ALT 35 50*  ALKPHOS 66 113  BILITOT 0.3 0.6  PROT 4.6* 5.6*  ALBUMIN 1.3* 1.7*   No results for input(s): LIPASE, AMYLASE in the last 168 hours. No results for input(s): AMMONIA in the last 168 hours. Coagulation Profile: No results for input(s): INR, PROTIME in the last 168 hours. Cardiac Enzymes: No results for input(s): CKTOTAL, CKMB, CKMBINDEX, TROPONINI in the last 168 hours. BNP (last 3 results) No results for input(s): PROBNP in the last 8760 hours. HbA1C: No results for input(s): HGBA1C in the last 72 hours. CBG: Recent Labs  Lab 07/30/18 0553 07/31/18 0755 08/01/18 0839 08/02/18 0715 08/03/18 0838  GLUCAP 106* 103* 101* 113* 132*   Lipid Profile: Recent Labs    08/03/18 0400  TRIG 83   Thyroid Function Tests: No results for input(s): TSH, T4TOTAL, FREET4, T3FREE, THYROIDAB in the last 72 hours. Anemia Panel: No results for input(s): VITAMINB12, FOLATE, FERRITIN, TIBC, IRON, RETICCTPCT in the last 72 hours. Sepsis Labs: No results for input(s): PROCALCITON, LATICACIDVEN in the last 168 hours.  Recent Results (from the past 240 hour(s))  Urine Culture     Status: None   Collection Time: 07/27/18  9:49 AM  Result Value Ref Range Status   Specimen Description URINE, RANDOM  Final   Special Requests NONE  Final   Culture   Final    NO GROWTH Performed at [facility], [address], [place], [place] [address]    Report Status 07/28/2018 FINAL  Final         Radiology Studies: No results found.      Scheduled Meds: . alum & mag hydroxide-simeth  15 mL Oral Once  . Chlorhexidine Gluconate Cloth  6 each Topical Q0600  . metoCLOPramide (REGLAN) injection  10 mg Intravenous Once  . nicotine  21 mg Transdermal Daily   Continuous Infusions: . sodium chloride 10  mL/hr at 08/03/18 1131  . piperacillin-tazobactam (ZOSYN)  IV 3.375 g (08/03/18 0906)  . TPN ADULT (ION) 95 mL/hr at 08/02/18 1746  . TPN ADULT (ION)       LOS: 15 days    Time spent: 30 minutes    Celine Dishman J [place] DO [facility] Pager [phone]  If 7PM-7AM, please contact night-coverage [email or web address] Password [facility] 08/03/2018, 2:34 PM

## 2018-08-03 NOTE — Progress Notes (Addendum)
Pt had 3-4 occurences of coffee ground emesis overnight despite antiemetic medications. Dr on call made aware. Pt uncomfortable, morphine and antiemetics administered with no improvement.

## 2018-08-03 NOTE — Progress Notes (Signed)
Pt had 4 occurences of coffee ground emesis overnight despite antiemetic medications. NP on call aware. Pt uncomfortable, morphine and antiemetics administered with some improvement for short period of time. No acute distress.

## 2018-08-04 ENCOUNTER — Inpatient Hospital Stay (HOSPITAL_COMMUNITY): Payer: Medicaid Other

## 2018-08-04 LAB — BASIC METABOLIC PANEL
Anion gap: 10 (ref 5–15)
BUN: 28 mg/dL — ABNORMAL HIGH (ref 6–20)
CO2: 32 mmol/L (ref 22–32)
Calcium: 8.8 mg/dL — ABNORMAL LOW (ref 8.9–10.3)
Chloride: 111 mmol/L (ref 98–111)
Creatinine, Ser: 0.45 mg/dL — ABNORMAL LOW (ref 0.61–1.24)
GFR calc Af Amer: >60 mL/min (ref >60)
GFR calc non Af Amer: >60 mL/min (ref >60)
Glucose, Bld: 157 mg/dL — ABNORMAL HIGH (ref 70–99)
Potassium: 3.4 mmol/L — ABNORMAL LOW (ref 3.5–5.1)
Sodium: 153 mmol/L — ABNORMAL HIGH (ref 135–145)

## 2018-08-04 LAB — CBC
HCT: 29.5 % — ABNORMAL LOW (ref 39.0–52.0)
HCT: 26.1 % — ABNORMAL LOW (ref 39.0–52.0)
Hemoglobin: 9.6 g/dL — ABNORMAL LOW (ref 13.0–17.0)
Hemoglobin: 8.3 g/dL — ABNORMAL LOW (ref 13.0–17.0)
MCH: 29.1 pg (ref 26.0–34.0)
MCH: 28.9 pg (ref 26.0–34.0)
MCHC: 32.5 g/dL (ref 30.0–36.0)
MCHC: 31.8 g/dL (ref 30.0–36.0)
MCV: 89.4 fL (ref 80.0–100.0)
MCV: 90.9 fL (ref 80.0–100.0)
Platelets: 404 10*3/uL — ABNORMAL HIGH (ref 150–400)
Platelets: 391 10*3/uL (ref 150–400)
RBC: 3.3 MIL/uL — ABNORMAL LOW (ref 4.22–5.81)
RBC: 2.87 MIL/uL — ABNORMAL LOW (ref 4.22–5.81)
RDW: 16.5 % — ABNORMAL HIGH (ref 11.5–15.5)
RDW: 16.5 % — ABNORMAL HIGH (ref 11.5–15.5)
WBC: 14.5 10*3/uL — ABNORMAL HIGH (ref 4.0–10.5)
WBC: 14.3 10*3/uL — ABNORMAL HIGH (ref 4.0–10.5)
nRBC: 0 % (ref 0.0–0.2)
nRBC: 0 % (ref 0.0–0.2)

## 2018-08-04 LAB — BPAM RBC
Blood Product Expiration Date: 202006222359
Blood Product Expiration Date: 202006222359
ISSUE DATE / TIME: 202006011250
ISSUE DATE / TIME: 202006011546
Unit Type and Rh: 7300
Unit Type and Rh: 7300

## 2018-08-04 LAB — TYPE AND SCREEN
ABO/RH(D): B POS
Antibody Screen: NEGATIVE
Unit division: 0
Unit division: 0

## 2018-08-04 LAB — GLUCOSE, CAPILLARY: Glucose-Capillary: 139 mg/dL — ABNORMAL HIGH (ref 70–99)

## 2018-08-04 LAB — MAGNESIUM: Magnesium: 2.4 mg/dL (ref 1.7–2.4)

## 2018-08-04 MED ORDER — TRAVASOL 10 % IV SOLN
INTRAVENOUS | Status: DC
  Filled 2018-08-04: qty 1254

## 2018-08-04 MED ORDER — PROMETHAZINE HCL 25 MG/ML IJ SOLN
12.5000 mg | Freq: Four times a day (QID) | INTRAMUSCULAR | Status: DC | PRN
  Administered 2018-08-04 – 2018-08-05 (×3): 12.5 mg via INTRAVENOUS
  Filled 2018-08-04 (×4): qty 1

## 2018-08-04 MED ORDER — PANTOPRAZOLE SODIUM 40 MG IV SOLR
40.0000 mg | Freq: Two times a day (BID) | INTRAVENOUS | Status: DC
  Administered 2018-08-04 – 2018-08-06 (×6): 40 mg via INTRAVENOUS
  Filled 2018-08-04 (×6): qty 40

## 2018-08-04 MED ORDER — TRAVASOL 10 % IV SOLN
INTRAVENOUS | Status: AC
  Administered 2018-08-04: 18:00:00 via INTRAVENOUS
  Filled 2018-08-04: qty 1254

## 2018-08-04 MED ORDER — DEXTROSE 5 % IV SOLN
INTRAVENOUS | Status: AC
  Administered 2018-08-04 – 2018-08-05 (×3): via INTRAVENOUS

## 2018-08-04 MED ORDER — POTASSIUM CHLORIDE 10 MEQ/100ML IV SOLN
10.0000 meq | INTRAVENOUS | Status: AC
  Administered 2018-08-04 (×6): 10 meq via INTRAVENOUS
  Filled 2018-08-04 (×6): qty 100

## 2018-08-04 NOTE — TOC Progression Note (Signed)
Transition of Care [facility]) - Progression Note    Patient Details  Name: Brandon Landry MRN: [id number] Date of Birth: Mar 02, 1966  [facility]) CM/SW [place], LCSW Phone Number: 08/04/2018, 12:50 PM  Clinical Narrative:    Per Clide Dales from [place], they have received permission to evacuate the patient on [address] and are requesting patient have a blood transfusion before he leaves. CSW alerted him that patient had received blood yesterday and faxed requested vitals and blood work results. CSW requested more info on the details and timing of the transfer.    Expected Discharge Plan: [place] Barriers to Discharge: Continued Medical Work up  Expected Discharge Plan and Services Expected Discharge Plan: [place] In-house Referral: Clinical Social Work, Hospice / [place] arrangements for the past 2 months: Apartment                 DME Arranged: N/A DME Agency: NA       HH Arranged: NA HH Agency: NA         Social Determinants of Health (SDOH) Interventions    Readmission Risk Interventions Readmission Risk Prevention Plan 07/07/2018  Transportation Screening Complete

## 2018-08-04 NOTE — Progress Notes (Signed)
Nutrition Follow-up  RD working remotely.  DOCUMENTATION CODES:   Underweight, Severe malnutrition in context of chronic illness  INTERVENTION:   -TPN management per pharmacy -Follow-up for ability to transition to enteral feedings  NUTRITION DIAGNOSIS:   Severe Malnutrition related to chronic illness(colonic mass) as evidenced by energy intake < 75% for > or equal to 1 month, moderate fat depletion, severe fat depletion, moderate muscle depletion, severe muscle depletion.  Ongoing  GOAL:   Patient will meet greater than or equal to 90% of their needs  Met with TPN  MONITOR:   PO intake, Supplement acceptance, Labs, Weight trends, Skin, I & O's  REASON FOR ASSESSMENT:   Consult New TPN/TNA  ASSESSMENT:   Brandon Landry is a 53 y.o. male with medical history significant of depression, iron deficiency anemia, tobacco abuse, who presents with generalized weakness.  5/18- s/p BSE-advanced to regular diet with thin liquids 5/19- PICC placed, TPN initiated 5/23- decreased to clear liquids due to abdominal pain and fullness; KUB revealed no perforation of bowel obstruction 5/24- advanced to full liquids 5/25- advanced to soft diet 5/30- TPN advanced to new goal rate, secondary to weight loss  Reviewed I/O's: +1.8 L x 24 hours and +20.2 L since 07/21/18  UOP: 1.5 L x 24 hours  Emesis: 700 ml x 24 hours  Pt with instances of coffee greound emesis over the past 48 hours. MD recommending KUB and NGT. Pt refused NGT placement.   Pt continues to refuse meals (meal completion 0%). He remains dependent on TPN for nutritional support- receiving at new goal rate of 95 mL/hr, which provides 2365 kcals and 125 grams protein, meeting 100% of estimated nutritional needs.   RD attempted to obtain nutrition history and discuss possible feeding tube placement with pton 07/22/18/20, however, pt was very tangential and appeared very overwhelmed with current health status. Please review noted  dated 07/22/18 for further details.While nutritional support via enteral route would be most ideal for pt(due to functioning GI tract), unsure if pt would agree to a temporary feeding tube (such as cortrak) at this time. Additionally, if pt is to go home or to SNF in the immediate future, a short term feeding tube may complicate discharge disposition, unless permanent enteral access is established (also unsure of likelihood of placing permanent access such as a PEG due to pt's medically fragile state). Due to severityof pt's malnutrition, recommend continue TPN if pt not amenable to short term feeding tube.  CSW assisting in arranging transport to [place] for further medical care and treatments. Awaiting further details regarding transfer from [employer] [facility] [place] agency.   Labs reviewed: Na: 153, K: 3.4, Mg WDL, CBGS: 139.   Diet Order:   Diet Order            DIET SOFT Room service appropriate? No; Fluid consistency: Thin  Diet effective now              EDUCATION NEEDS:   Education needs have been addressed  Skin:  Skin Assessment: Reviewed RN Assessment  Last BM:  08/03/18  Height:   Ht Readings from Last 1 Encounters:  07/24/18 5' 8.11" (1.73 m)    Weight:   Wt Readings from Last 1 Encounters:  08/04/18 40.3 kg    Ideal Body Weight:  70 kg  BMI:  Body mass index is 13.47 kg/m.  Estimated Nutritional Needs:   Kcal:  2250-2450  Protein:  115-130 grams  Fluid:  > 2.2 L  Brandon Landry A. Jimmye Norman, RD, LDN, [place] Registered Dietitian II Certified Diabetes Care and Education Specialist Pager: [phone] After hours Pager: [phone]

## 2018-08-04 NOTE — Progress Notes (Signed)
Pt had Protonix scheduled halfway through pushing it the pt requested that I stop. I attempted to educate however he did not want me to continue pushing the medication.

## 2018-08-04 NOTE — Progress Notes (Signed)
PROGRESS NOTE    Brandon Landry  GUY:[id number] DOB: [birth year] DOA: 07/19/2018 PCP: Patient, No Pcp Per    Brief Narrative:   Patient is a45 y.o.malewho was hospitalized from 5/4 to 5/6 after he was found to have severe microcytic anemia with a hemoglobin of 3.1, he was transfused PRBC, CT scan of the abdomen on 5/4 showed a large fungating mass in the ascending colon with contained perforation and adjacent abscess formation, general surgery was subsequently consulted-surgical intervention was contemplated-however patient refused and was subsequently discharged home on 5/6. He returned [facility] on 5/16 with generalized weakness, and was found to have a hemoglobin of3.2.He was transfused 2 units of PRBC, and transferred to [facility]. He now wants to pursue surgical options.  General surgery was consulted, cannot undergo surgery due to severe malnutrition, patient refuses to eat American food. Palliative care was consulted, currently working with patient and family regarding goals of care. PICC line has been placed and patient has been started on TPN for nourishment. Patient now wishes to go to [place] for all of his medical care, being investigated by palliative medicine. 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 No fever or abdominal pain. Repeat CT scan showed fungating mass with ascites. General surgery has seen the patient and there is no plan for surgery at this time since the patient has refused surgical intervention here and wishes to go to [place], and furthermore he is severely malnourished.   --Continues with mild leukocytosis, WBC count 13.0 --continue empiric antibiotics with Zosyn --Pain control with IV morphine prn --Antiemetics PRN with Zosyn, Reglan, Phenergan --Family trying to arrange air transportation with "[facility] [place]".  --Grim prognosis especially in the setting of refusal of interventions --supportive care  Severe microcytic anemia, present on admission Etiology likely secondary to colon cancer.  Initial hemoglobin of 3.2.  Patient received 2 units of packed RBC at [facility] followed by 7 units of pRBC's at [facility]. --Hgb 7.9-->7.4-->4.0-->8.0-->7.0-->7.7-->7.2-->9.2 today --Protonix 40 mg IV every 12 hours --Continue monitor hemoglobin daily, will transfuse for hemoglobin less than 7.0  Left arm weakness, bilateral strokes MRI brain showed numerous punctate foci of acute/early subacute infarctions in the right greater than left posterior cerebral hemispheres, right cerebellar hemispheres, no enhancing metastasis. CT chest showed small bilateral pleural effusions with mural filling defect along the left right lateral wall of ascending thoracic aorta concerning for thrombus. CTA aorta showed several areas of mural thickening and intimal irregularity noted in the ascending aorta favoring infectious vegetation/thrombus. 2D echo showed EF of 40 to 45% with low flow state LV, recommend rule out thrombus.  Neurology now signed off, not on antithrombotic agents given severe anemia. Not a candidate for anticoagulation unless anemia and bleeding source is  secured. However patient has refused surgery for the  fungating colon mass.  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 Recommended palliative and hospice at this time.  Left arm swelling Doppler studies negative for DVT  Hypokalemia Potassium 3.4 today, magnesium 2.4. --Replete potassium today --Repeat electrolytes in a.m. to include magnesium  Severe protein calorie malnutrition, hypoalbuminemia Continue TNA.  Has poor appetite, refuses "American food"   DVT prophylaxis: SCDs Code Status: Full code Family Communication: None Disposition Plan:  Overall poor prognosis. Unfortunately no local support due to recent divorce and immediate family in [place] and [place]. Representative from [place] has been following, patient will possibly discharge soon on [place] out of [place], [place] per case management notes for further management in [place] per patient's wishes and family wishes.  Unless patient has a medical representative or family member accompanying him on the aircraft, likely a unsafe discharge and would likely need to sign out AMA.  Consultants:   General surgery  Palliative care  Neurology  Procedures:   none  Antimicrobials:  Zosyn 5/17>>>   Subjective: Patient with several coffee-ground emesis episodes overnight with intermittent abdominal discomfort.  Improved with IV morphine and IV Phenergan.  Hemoglobin up to 9.6 this morning.  No family present at bedside.  Patient states "feel better with medicine".  States he should be flying out of country today, but no confirmation from his family or representatives from the "[facility] [place]" for actual flight information.  No other complaints/concerns this morning. Denies headache, no fever/chills/night sweats, no diarrhea, no cough/congestion, no chest pain, no palpitations.  No acute events overnight per  nursing staff.  Objective: Vitals:   08/03/18 2230 08/04/18 0500 08/04/18 0624 08/04/18 1100  BP: 136/90  (!) 137/97   Pulse: (!) 102  99   Resp:      Temp: (!) 97.5 F (36.4 C)  97.6 F (36.4 C)   TempSrc:      SpO2: 100%  100%   Weight:  43.1 kg  40.3 kg  Height:        Intake/Output Summary (Last 24 hours) at 08/04/2018 1343 Last data filed at 08/04/2018 1156 Gross per 24 hour  Intake 3983.2 ml  Output 2680 ml  Net 1303.2 ml   Filed Weights   08/03/18 0500 08/04/18 0500 08/04/18 1100  Weight: 43.1 kg 43.1 kg 40.3 kg    Examination:  General exam: Appears calm and comfortable, cachectic in appearance, language barrier but able to answer simple questions appropriately Respiratory system: Clear to auscultation. Respiratory effort normal. Cardiovascular system: S1 & S2 heard, RRR. No JVD, murmurs, rubs, gallops or clicks. No pedal edema. Gastrointestinal system: Abdomen is scaphoid in appearance, nondistended, soft and nontender. No organomegaly or masses felt. Normal bowel sounds heard. Central nervous system: Alert and oriented. No focal neurological deficits. Extremities: No peripheral edema Skin: No rashes, lesions or ulcers Psychiatry: Judgement and insight appear normal. Mood & affect appropriate.     Data Reviewed: I have personally reviewed following labs and imaging studies  CBC: Recent Labs  Lab 07/30/18 0353  08/01/18 0446 08/01/18 1519 08/02/18 0419 08/03/18 0400 08/03/18 2109 08/04/18 0512  WBC 13.8*  --  12.5*  --  13.0* 13.9*  --  14.5*  NEUTROABS  --   --   --   --   --  12.2*  --   --  HGB 4.0*   < > 7.0* 7.9* 7.7* 7.2* 9.6* 9.6*  HCT 12.9*   < > 21.6* 23.6* 22.9* 22.5* 29.7* 29.5*  MCV 93.5  --  87.8  --  88.1 91.1  --  89.4  PLT 330  --  334  --  340 389  --  404*   < > = values in this interval not displayed.   Basic Metabolic Panel: Recent Labs  Lab 07/30/18 0353 08/01/18 0446 08/02/18 0419 08/03/18 0400 08/04/18 0512  NA 140 140  142 146* 153*  K 3.8 3.4* 3.8 3.6 3.4*  CL 112* 108 109 110 111  CO2 22 23 26 28  32  GLUCOSE 107* 106* 126* 145* 157*  BUN 23* 20 18 21* 28*  CREATININE 0.40* 0.36* 0.32* 0.36* 0.45*  CALCIUM 7.5* 8.0* 7.9* 8.2* 8.8*  MG 2.0 2.1 2.1 2.3 2.4  PHOS 3.1  --   --  3.7  --    GFR: Estimated Creatinine Clearance: 60.9 mL/min (A) (by C-G formula based on SCr of 0.45 mg/dL (L)). Liver Function Tests: Recent Labs  Lab 07/30/18 0353 08/03/18 0400  AST 33 35  ALT 35 50*  ALKPHOS 66 113  BILITOT 0.3 0.6  PROT 4.6* 5.6*  ALBUMIN 1.3* 1.7*   No results for input(s): LIPASE, AMYLASE in the last 168 hours. No results for input(s): AMMONIA in the last 168 hours. Coagulation Profile: No results for input(s): INR, PROTIME in the last 168 hours. Cardiac Enzymes: No results for input(s): CKTOTAL, CKMB, CKMBINDEX, TROPONINI in the last 168 hours. BNP (last 3 results) No results for input(s): PROBNP in the last 8760 hours. HbA1C: No results for input(s): HGBA1C in the last 72 hours. CBG: Recent Labs  Lab 07/31/18 0755 08/01/18 0839 08/02/18 0715 08/03/18 0838 08/04/18 0627  GLUCAP 103* 101* 113* 132* 139*   Lipid Profile: Recent Labs    08/03/18 0400  TRIG 83   Thyroid Function Tests: No results for input(s): TSH, T4TOTAL, FREET4, T3FREE, THYROIDAB in the last 72 hours. Anemia Panel: No results for input(s): VITAMINB12, FOLATE, FERRITIN, TIBC, IRON, RETICCTPCT in the last 72 hours. Sepsis Labs: No results for input(s): PROCALCITON, LATICACIDVEN in the last 168 hours.  Recent Results (from the past 240 hour(s))  Urine Culture     Status: None   Collection Time: 07/27/18  9:49 AM  Result Value Ref Range Status   Specimen Description URINE, RANDOM  Final   Special Requests NONE  Final   Culture   Final    NO GROWTH Performed at [facility], [address], [place], [place] [address]    Report Status 07/28/2018 FINAL  Final         Radiology Studies: Dg Abd  Portable 1v  Result Date: 08/04/2018 CLINICAL DATA:  Hematemesis EXAM: PORTABLE ABDOMEN - 1 VIEW COMPARISON:  08/01/2018 FINDINGS: Scattered large and small bowel gas is noted. No obstructive changes are seen. The calcifications seen over the left kidney are less well appreciated due to the differing film technique. No free air is noted. No bony abnormality is seen. IMPRESSION: No acute abnormality noted. Previously seen left renal calculi are not as well appreciated on today's exam. Electronically Signed   By: Inez Catalina M.D.   On: 08/04/2018 07:46        Scheduled Meds:  alum & mag hydroxide-simeth  15 mL Oral Once   Chlorhexidine Gluconate Cloth  6 each Topical Q0600   metoCLOPramide (REGLAN) injection  10 mg  Intravenous Once   nicotine  21 mg Transdermal Daily   pantoprazole (PROTONIX) IV  40 mg Intravenous Q12H   Continuous Infusions:  sodium chloride 10 mL/hr at 08/03/18 1131   dextrose 75 mL/hr at 08/04/18 1005   piperacillin-tazobactam (ZOSYN)  IV 3.375 g (08/04/18 1015)   potassium chloride 10 mEq (08/04/18 1317)   TPN ADULT (ION) 95 mL/hr at 08/04/18 0104   TPN ADULT (ION)       LOS: 16 days    Time spent: 30 minutes    Genny Caulder J [place] DO [facility] Pager [phone]  If 7PM-7AM, please contact night-coverage [email or web address] Password [email or web address] 08/04/2018, 1:43 PM

## 2018-08-04 NOTE — Progress Notes (Signed)
Interpreter computer used.  Pt does not know what time his flight his.  He says it is out of [place].  He does not know how he will get to [facility].  Pt refuses NG tube and medications at this time.

## 2018-08-04 NOTE — Progress Notes (Signed)
RN reported episodic coffee ground emesis tonight. Hgb 9.6. BP 130s. Will place NGT and get a KUB. Will defer to days if he needs a CT abd.  KJKG, NP [facility]

## 2018-08-04 NOTE — Progress Notes (Signed)
PHARMACY - ADULT TOTAL PARENTERAL NUTRITION CONSULT NOTE   Pharmacy Consult for TPN Indication: Severe malnutrition; R-colon mass extending into duodenum   Patient Measurements: Height: 5' 8.11" (173 cm) Weight: 95 lb 0.3 oz (43.1 kg) IBW/kg (Calculated) : 68.65    Assessment: 53 year old male with R-colon mass possibly extending into duodenum, aortic thrombus, and small strokes in brain. He is very malnourished and refuses to eat (only wants [place]). He is ok with TPN and needs improved nutrition if going to have surgery. Palliative care on board.   GI: Pre-albumin <5>>7.3>>14.1. LBM 5/30; coffee-ground emesis x4 reported  (Hgb down 7.2).  Endo: CBGs 113-160 with TPN. No SSI Insulin requirements in the past 24 hours: 0 units Lytes: Na up 140 > 153, K 3.4, Mg & Phos wnl; Corr Ca 10.9 (alb 1.7), Ca x Phos 40 Renal: SCr 0.45 UOP 1.1 mL/kg/hr Pulm: no issues - RA Cards: VSS Hepatobil: ALT up 50, AST/AlkPhos/Tbili/TG within normal limits.  Neuro: pain 0-2. Got high dose Thiamine, now with 100mg  daily in TPN. ID: WBC 14.5 on Zosyn. Afebrile. PCT 0.10. Heme: Hgb 4 >>8 post transfusion  TPN Access: PICC  TPN start date: 07/21/18 Nutritional Goals (per RD recommendation on 5/29): KCal: 2250 - 2450 / day Protein: 115 - 130 g Fluid: > 2.2 L / day  Goal TPN rate is 95 ml/hr (provides 125 g protein, 64 g lipids, and 360 g dextrose, 2365 kcal meeting 100% of needs)  Current Nutrition: Regular diet- 0% TPN   Plan:  -Continue TPN at goal rate of 95 mL/hr -This TPN provides 125 g protein, 64 g lipids, and 360 g dextrose, 2365 kcal meeting 100% of needs -Electrolytes in TPN: Reduce Na, continue increased K, others to standard. [id number] 1:1.  -Continue MVI, thiamine in TPN -Continue trace elements MWF due to shortage -Monitor TPN labs -F/u placement of NG tube?    Brandon Landry 08/04/2018 8:05 AM

## 2018-08-04 NOTE — Progress Notes (Signed)
PT Cancellation Note  Patient Details Name: Brandon Landry MRN: [id number] DOB: Aug 24, 1965   Cancelled Treatment:    Reason Eval/Treat Not Completed: Patient declined, no reason specified;Other (comment) Attempted to see pt this am (around 900) for PT session. Upon arrival pt lying in bed and appeared to be in distress. Pt had vomited on the bed and in the floor. Nursing staff notified. PT will continue to follow acutely.     Earney Navy, PTA Acute Rehabilitation Services Pager: [phone] Office: [phone]   08/04/2018, 4:11 PM

## 2018-08-04 NOTE — Progress Notes (Signed)
Pt had 1 occurences of coffee ground emesis

## 2018-08-04 NOTE — Progress Notes (Signed)
Pt has refused to communicate, even when translator computer is used.  Pt refuses to follow commands most of the time. NIH scale was not preformed with pt today or yesterday because pt will not cooperate.   Pt was allowed to drink water and eat ice after vomiting 1289ml earlier today.  He states he feels somewhat better. No vomiting episodes since this morning.  He continues to get iv fluids and tpn.

## 2018-08-04 NOTE — Progress Notes (Signed)
Pt refuses to allow placement of NGT

## 2018-08-05 ENCOUNTER — Inpatient Hospital Stay (HOSPITAL_COMMUNITY): Payer: Medicaid Other

## 2018-08-05 LAB — CBC
HCT: 25.4 % — ABNORMAL LOW (ref 39.0–52.0)
Hemoglobin: 8 g/dL — ABNORMAL LOW (ref 13.0–17.0)
MCH: 29.3 pg (ref 26.0–34.0)
MCHC: 31.5 g/dL (ref 30.0–36.0)
MCV: 93 fL (ref 80.0–100.0)
Platelets: 418 10*3/uL — ABNORMAL HIGH (ref 150–400)
RBC: 2.73 MIL/uL — ABNORMAL LOW (ref 4.22–5.81)
RDW: 16.4 % — ABNORMAL HIGH (ref 11.5–15.5)
WBC: 16.2 10*3/uL — ABNORMAL HIGH (ref 4.0–10.5)
nRBC: 0 % (ref 0.0–0.2)

## 2018-08-05 LAB — BASIC METABOLIC PANEL
Anion gap: 8 (ref 5–15)
BUN: 28 mg/dL — ABNORMAL HIGH (ref 6–20)
CO2: 25 mmol/L (ref 22–32)
Calcium: 8.6 mg/dL — ABNORMAL LOW (ref 8.9–10.3)
Chloride: 110 mmol/L (ref 98–111)
Creatinine, Ser: 0.4 mg/dL — ABNORMAL LOW (ref 0.61–1.24)
GFR calc Af Amer: >60 mL/min (ref >60)
GFR calc non Af Amer: >60 mL/min (ref >60)
Glucose, Bld: 117 mg/dL — ABNORMAL HIGH (ref 70–99)
Potassium: 3.8 mmol/L (ref 3.5–5.1)
Sodium: 143 mmol/L (ref 135–145)

## 2018-08-05 LAB — GLUCOSE, CAPILLARY
Glucose-Capillary: 117 mg/dL — ABNORMAL HIGH (ref 70–99)
Glucose-Capillary: 125 mg/dL — ABNORMAL HIGH (ref 70–99)
Glucose-Capillary: 105 mg/dL — ABNORMAL HIGH (ref 70–99)

## 2018-08-05 LAB — MAGNESIUM: Magnesium: 2.1 mg/dL (ref 1.7–2.4)

## 2018-08-05 MED ORDER — TRAVASOL 10 % IV SOLN
INTRAVENOUS | Status: AC
  Administered 2018-08-05: 18:00:00 via INTRAVENOUS
  Filled 2018-08-05: qty 1254

## 2018-08-05 NOTE — Progress Notes (Signed)
OT Cancellation Note  Patient Details Name: Brandon Landry MRN: [id number] DOB: 03/27/1965   Cancelled Treatment:    Reason Eval/Treat Not Completed: Patient declined, no reason specified pt  declined PT session immediately prior to OT attempt for session. Per nsg, pt does not appear enthusiastic this date. OT will return later if time allows and pt is appropriate.   Dorinda Hill OTR/L Acute Rehabilitation Services Office: [place] 08/05/2018, 4:30 PM

## 2018-08-05 NOTE — Progress Notes (Addendum)
PHARMACY - ADULT TOTAL PARENTERAL NUTRITION CONSULT NOTE   Pharmacy Consult for TPN Indication: Severe malnutrition; R-colon mass extending into duodenum   Patient Measurements: Height: 5' 8.11" (173 cm) Weight: 88 lb 13.5 oz (40.3 kg) IBW/kg (Calculated) : 68.65    Assessment: 53 year old male with R-colon mass possibly extending into duodenum, aortic thrombus, and small strokes in brain. He is very malnourished and refuses to eat (only wants [place]). He is ok with TPN and needs improved nutrition if going to have surgery. Palliative care on board.   GI: Pre-albumin <5>>7.3>>14.1. LBM 5/30; daily emesis for the past few days Endo: CBGs 113-160 with TPN. No SSI, has d5 @ 75 ml/hr Insulin requirements in the past 24 hours: 0 units Lytes: Na fluctuating 143, K 3.4, Mg & Phos wnl; Corr Ca 10.9 (alb 1.7), Ca x Phos 40 Renal: SCr 0.4 UOP 1.1 mL/kg/hr Pulm: no issues - RA Cards: VSS Hepatobil: ALT up 50, AST/AlkPhos/Tbili/TG within normal limits.  Neuro: pain 0-2. Got high dose Thiamine, now with 100mg  daily in TPN. ID: WBC 14.5 on Zosyn. Afebrile. PCT 0.10. Heme: Hgb 4 >> s/p multiple prbc  TPN Access: PICC  TPN start date: 07/21/18 Nutritional Goals (per RD recommendation on 6/2): KCal: 2250 - 2450 / day Protein: 115 - 130 g Fluid: > 2.2 L / day  Goal TPN rate is 95 ml/hr (provides 125 g protein, 64 g lipids, and 360 g dextrose, 2365 kcal meeting 100% of needs)  Current Nutrition: Regular diet 0%, refusing other food TPN  Plan:  -Continue TPN at goal rate of 95 mL/hr -This TPN provides 125 g protein, 64 g lipids, and 360 g dextrose, 2365 kcal meeting 100% of needs -Electrolytes in TPN: lytes to standard [id number] 1:1.  -Continue MVI, thiamine in TPN -Continue trace elements MWF due to shortage -Monitor TPN labs    [name] 08/05/2018 9:05 AM

## 2018-08-05 NOTE — Progress Notes (Signed)
Patient ID: Brandon Landry, male   DOB: [birth year], 53 y.o.   MRN: [id number]  [place]  ASN:[id number] DOB: 24-Mar-1965 DOA: 07/19/2018 PCP: Patient, No Pcp Per   Brief Narrative:  53 year old male with history of depression, iron deficiency anemia, tobacco abuse, recent few hospitalizations for anemia status post transfusion, recently found to have a large fungating mass in the ascending colon with contained perforation and adjacent abscess formation during last hospitalization for with general surgery recommended surgical intervention however patient refused and patient was subsequently discharged on 07/08/2018.  He returned to [facility] on 07/18/2018 with generalized weakness and was found to have hemoglobin of 3.2.  He was transfused packed red cells and transferred to [place] surgery was consulted.  He currently cannot undergo surgery due to severe malnutrition.  Patient apparently refuses to eat American food.  Palliative care was also consulted for goals of care discussion.  Patient was started on TPN.  Patient now wants to go to [place] for all of his medical care.  This is currently being pursued by palliative care team along with social work/care management.  He was also noted to have embolic CVAs on MRI of the brain for which he was also evaluated by neurology.  Assessment & Plan:   Principal Problem:   Symptomatic anemia Active Problems:   Iron deficiency anemia due to chronic blood loss   Protein-calorie malnutrition, severe (HCC)   Lower extremity edema   Tobacco use disorder   Hypokalemia   Giant mass of hepatic flexure of colon - probable perforated colon cancer   Left arm weakness   Left arm swelling   Palliative care encounter   Cerebral embolism with cerebral infarction  Large fungating right colon mass -CT abdomen/pelvis on admission showed large fungating mass in the ascending colon with probable perforation and adjacent abscesses;  also with likely extension to the duodenum with aortic thrombus.  No fever or abdominal pain at the time.  Repeat CT scanning showed fungating mass with ascites.  General surgery has seen the patient and there is no plan for surgery at this time since the patient has refused surgical intervention here and wishes to go to [place], and furthermore he is severely malnourished  -Currently on Zosyn.  Has persistent leukocytosis. -Continue TPN.  Continue pain management. -Family trying to arrange for the patient to be transported to [place].  Social worker is working on the same. [facility] [place] is involved in the same. -He is at high risk for decompensation during air travel but risks do not outweigh benefits for the patient and family.  Once travel plans are arranged, he will be discharged with PICC line in place. -In the meantime, if condition worsens, patient should consider hospice/comfort measures.  Overall prognosis is very poor.  Currently remains full code.  Leukocytosis -Has persistent leukocytosis.  Continue antibiotics.  If leukocytosis worsens, might have to consider repeat CAT scan of the abdomen.  Coffee-ground emesis -Continue Protonix 40 mg IV every 12 hours.  Patient intermittently refuses Protonix. -Transfuse if hemoglobin is less than 7. -If continues to have hematemesis, might consider GI evaluation.  Severe microcytic anemia -Likely secondary to colon cancer -Hemoglobin 3.2 on admission. -Patient has had 9 units of packed red cells transfusion at [facility] since admission and 2 units at [facility] prior to transfer. -Hemoglobin is 8 today.  Monitor.  Bilateral cerebral and right cerebellar strokes in the setting of descending  aortic vegetation/thrombus and large abdominal mass, likely hypercoagulable from advanced cancer -MRI showed numerous punctate foci of acute/early subacute infarctions in the right greater than left posterior cerebral hemispheres and  right cerebellar hemispheres. -CT of the chest showed mural filling defect along the right lateral wall of ascending thoracic aorta concerning for thrombus -CTA aorta showed several areas of mural thickening and intimal irregularity noted in the ascending aorta, strongly favored to represent infectious vegetation/thrombus -2D echo showed EF of 40 to 45% -Neurology evaluated the patient during the hospitalization and has signed off.  Currently not on anticoagulation because of severe anemia and ongoing bleeding.  Neurology had recommended hospice care.  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 -Patient needs to consider hospice/comfort measures.  Currently refusing the same and wants to go to [place] for further treatment  Left arm swelling  -Doppler studies negative for DVT  Severe protein calorie malnutrition/hypoalbuminemia -Continue TPN.  Has poor appetite, refuses 'American food'   DVT prophylaxis: SCDs  code Status: Full Family Communication: None at bedside. Disposition Plan: Plan for patient to be transported to [place].  This is being arranged by [facility] [place].  Apparently, patient will need to be flown out of [place].  Arrangements for patient to be transported to [facility] is being made.  Unless patient has medical representative or family member accompanying him to [place], likely unsafe discharge and would likely need to sign out AMA.  Consultants: General surgery/palliative care/neurology  Procedures:  Echo IMPRESSIONS    1. The left ventricle has mild-moderately reduced systolic function, with an ejection fraction of 40-45%. The cavity size was normal. Left ventricular diastolic parameters were normal.  2. There is akinesis of the apical inferolateral, inferior and anterior walls as well as entire apex. There  is significant spontaneous echo contrast in the LV cavity consistent with low flow state. Consider limited study with definity to rule out LV  thrombus.  3. The right ventricle has normal systolic function. The cavity was normal. There is no increase in right ventricular wall thickness. Right ventricular systolic pressure could not be assessed.  4. Trivial pericardial effusion is present.  5. The aortic valve is tricuspid. Mild sclerosis of the aortic valve.  Antimicrobials:  Zosyn from 07/18/2018 onwards  Subjective: Patient seen and examined at bedside.  He is awake, very poor historian.  Hardly engages in any conversation/communication.  He apparently had coffee-ground emesis last night and refused Protonix as per nursing staff.  No overnight fever.  Objective: Vitals:   08/04/18 0624 08/04/18 1100 08/04/18 1547 08/04/18 2147  BP: (!) 137/97  (!) 160/83 116/77  Pulse: 99  (!) 101 99  Resp:   14 16  Temp: 97.6 F (36.4 C)   97.8 F (36.6 C)  TempSrc:      SpO2: 100%  100% 100%  Weight:  40.3 kg    Height:        Intake/Output Summary (Last 24 hours) at 08/05/2018 1600 Last data filed at 08/05/2018 0300 Gross per 24 hour  Intake 1923.31 ml  Output 1330 ml  Net 593.31 ml   Filed Weights   08/03/18 0500 08/04/18 0500 08/04/18 1100  Weight: 43.1 kg 43.1 kg 40.3 kg    Examination:  General exam: Appears chronically ill.  No acute distress.  Awake, hardly engages in conversation.  Very poor historian. Respiratory system: Bilateral decreased breath  sounds at bases Cardiovascular system: S1 & S2 heard, tachycardic Gastrointestinal system: Abdomen is distended, soft and nontender. Normal bowel sounds heard. Extremities: No cyanosis, clubbing, edema  Central nervous system: Awake, answers some questions but very poor historian.. No focal neurological deficits. Moving extremities Skin: No rashes, lesions or ulcers Psychiatry: Flat affect.    Data Reviewed: I have personally  reviewed following labs and imaging studies  CBC: Recent Labs  Lab 08/02/18 0419 08/03/18 0400 08/03/18 2109 08/04/18 0512 08/04/18 1346 08/05/18 0439  WBC 13.0* 13.9*  --  14.5* 14.3* 16.2*  NEUTROABS  --  12.2*  --   --   --   --   HGB 7.7* 7.2* 9.6* 9.6* 8.3* 8.0*  HCT 22.9* 22.5* 29.7* 29.5* 26.1* 25.4*  MCV 88.1 91.1  --  89.4 90.9 93.0  PLT 340 389  --  404* 391 570*   Basic Metabolic Panel: Recent Labs  Lab 07/30/18 0353 08/01/18 0446 08/02/18 0419 08/03/18 0400 08/04/18 0512 08/05/18 0439  NA 140 140 142 146* 153* 143  K 3.8 3.4* 3.8 3.6 3.4* 3.8  CL 112* 108 109 110 111 110  CO2 22 23 26 28  32 25  GLUCOSE 107* 106* 126* 145* 157* 117*  BUN 23* 20 18 21* 28* 28*  CREATININE 0.40* 0.36* 0.32* 0.36* 0.45* 0.40*  CALCIUM 7.5* 8.0* 7.9* 8.2* 8.8* 8.6*  MG 2.0 2.1 2.1 2.3 2.4 2.1  PHOS 3.1  --   --  3.7  --   --    GFR: Estimated Creatinine Clearance: 60.9 mL/min (A) (by C-G formula based on SCr of 0.4 mg/dL (L)). Liver Function Tests: Recent Labs  Lab 07/30/18 0353 08/03/18 0400  AST 33 35  ALT 35 50*  ALKPHOS 66 113  BILITOT 0.3 0.6  PROT 4.6* 5.6*  ALBUMIN 1.3* 1.7*   No results for input(s): LIPASE, AMYLASE in the last 168 hours. No results for input(s): AMMONIA in the last 168 hours. Coagulation Profile: No results for input(s): INR, PROTIME in the last 168 hours. Cardiac Enzymes: No results for input(s): CKTOTAL, CKMB, CKMBINDEX, TROPONINI in the last 168 hours. BNP (last 3 results) No results for input(s): PROBNP in the last 8760 hours. HbA1C: No results for input(s): HGBA1C in the last 72 hours. CBG: Recent Labs  Lab 08/02/18 0715 08/03/18 0838 08/04/18 0627 08/05/18 0754 08/05/18 1140  GLUCAP 113* 132* 139* 117* 125*   Lipid Profile: Recent Labs    08/03/18 0400  TRIG 83   Thyroid Function Tests: No results for input(s): TSH, T4TOTAL, FREET4, T3FREE, THYROIDAB in the last 72 hours. Anemia Panel: No results for input(s):  VITAMINB12, FOLATE, FERRITIN, TIBC, IRON, RETICCTPCT in the last 72 hours. Sepsis Labs: No results for input(s): PROCALCITON, LATICACIDVEN in the last 168 hours.  Recent Results (from the past 240 hour(s))  Urine Culture     Status: None   Collection Time: 07/27/18  9:49 AM  Result Value Ref Range Status   Specimen Description URINE, RANDOM  Final   Special Requests NONE  Final   Culture   Final    NO GROWTH Performed at [facility], [address], [place], [place] [address]    Report Status 07/28/2018 FINAL  Final         Radiology Studies: Dg Abd Portable 1v  Result Date: 08/04/2018 CLINICAL DATA:  Hematemesis EXAM: PORTABLE ABDOMEN - 1 VIEW COMPARISON:  08/01/2018 FINDINGS: Scattered large and small bowel gas is noted. No obstructive changes are seen.  The calcifications seen over the left kidney are less well appreciated due to the differing film technique. No free air is noted. No bony abnormality is seen. IMPRESSION: No acute abnormality noted. Previously seen left renal calculi are not as well appreciated on today's exam. Electronically Signed   By: Inez Catalina M.D.   On: 08/04/2018 07:46        Scheduled Meds:  alum & mag hydroxide-simeth  15 mL Oral Once   Chlorhexidine Gluconate Cloth  6 each Topical Q0600   metoCLOPramide (REGLAN) injection  10 mg Intravenous Once   nicotine  21 mg Transdermal Daily   pantoprazole (PROTONIX) IV  40 mg Intravenous Q12H   Continuous Infusions:  sodium chloride 250 mL (08/05/18 1518)   dextrose 100 mL/hr at 08/05/18 0300   piperacillin-tazobactam (ZOSYN)  IV 3.375 g (08/05/18 1520)   TPN ADULT (ION) 95 mL/hr at 08/05/18 0300   TPN ADULT (ION)       LOS: 17 days        Aline August, MD [facility] 08/05/2018, 4:00 PM

## 2018-08-05 NOTE — Progress Notes (Signed)
Spoke with Crystal from pharmacy and was made aware that zosyn and D5 are compatible medications and both can be given in picc line no new PIV needed.

## 2018-08-05 NOTE — Progress Notes (Signed)
PT Cancellation Note  Patient Details Name: Brandon Landry MRN: [id number] DOB: Sep 27, 1965   Cancelled Treatment:    Reason Eval/Treat Not Completed: Patient declined, no reason specified;Other (comment)  Roney Marion, PT  Acute Rehabilitation Services Pager [phone] Office [phone]   Colletta [place] 08/05/2018, 4:08 PM

## 2018-08-05 NOTE — TOC Progression Note (Signed)
Transition of Care [facility]) - Progression Note    Patient Details  Name: Brandon Landry MRN: [id number] Date of Birth: 05/02/1965  Transition of Care [facility]) CM/SW [place], LCSW Phone Number: 08/05/2018, 6:18 PM  Clinical Narrative:    CSW has not heard anything regarding details of medical evacuation to [place] today. CSW awaiting info from [employer]; they are aware that they need to provide this info.    Expected Discharge Plan: [place] Barriers to Discharge: Continued Medical Work up  Expected Discharge Plan and Services Expected Discharge Plan: [place] In-house Referral: Clinical Social Work, Hospice / [place] arrangements for the past 2 months: Apartment                 DME Arranged: N/A DME Agency: NA       HH Arranged: NA HH Agency: NA         Social Determinants of Health (SDOH) Interventions    Readmission Risk Interventions Readmission Risk Prevention Plan 07/07/2018  Transportation Screening Complete

## 2018-08-06 ENCOUNTER — Inpatient Hospital Stay (HOSPITAL_COMMUNITY): Payer: Medicaid Other

## 2018-08-06 LAB — COMPREHENSIVE METABOLIC PANEL
ALT: 42 U/L (ref 0–44)
AST: 21 U/L (ref 15–41)
Albumin: 1.7 g/dL — ABNORMAL LOW (ref 3.5–5.0)
Alkaline Phosphatase: 128 U/L — ABNORMAL HIGH (ref 38–126)
Anion gap: 8 (ref 5–15)
BUN: 26 mg/dL — ABNORMAL HIGH (ref 6–20)
CO2: 21 mmol/L — ABNORMAL LOW (ref 22–32)
Calcium: 8.3 mg/dL — ABNORMAL LOW (ref 8.9–10.3)
Chloride: 108 mmol/L (ref 98–111)
Creatinine, Ser: 0.44 mg/dL — ABNORMAL LOW (ref 0.61–1.24)
GFR calc Af Amer: >60 mL/min (ref >60)
GFR calc non Af Amer: >60 mL/min (ref >60)
Glucose, Bld: 108 mg/dL — ABNORMAL HIGH (ref 70–99)
Potassium: 3.5 mmol/L (ref 3.5–5.1)
Sodium: 137 mmol/L (ref 135–145)
Total Bilirubin: 1.7 mg/dL — ABNORMAL HIGH (ref 0.3–1.2)
Total Protein: 5.7 g/dL — ABNORMAL LOW (ref 6.5–8.1)

## 2018-08-06 LAB — CBC
HCT: 25.2 % — ABNORMAL LOW (ref 39.0–52.0)
Hemoglobin: 7.8 g/dL — ABNORMAL LOW (ref 13.0–17.0)
MCH: 28.9 pg (ref 26.0–34.0)
MCHC: 31 g/dL (ref 30.0–36.0)
MCV: 93.3 fL (ref 80.0–100.0)
Platelets: 509 10*3/uL — ABNORMAL HIGH (ref 150–400)
RBC: 2.7 MIL/uL — ABNORMAL LOW (ref 4.22–5.81)
RDW: 15.6 % — ABNORMAL HIGH (ref 11.5–15.5)
WBC: 17 10*3/uL — ABNORMAL HIGH (ref 4.0–10.5)
nRBC: 0 % (ref 0.0–0.2)

## 2018-08-06 LAB — PHOSPHORUS: Phosphorus: 3.9 mg/dL (ref 2.5–4.6)

## 2018-08-06 LAB — MAGNESIUM: Magnesium: 1.9 mg/dL (ref 1.7–2.4)

## 2018-08-06 LAB — GLUCOSE, CAPILLARY: Glucose-Capillary: 123 mg/dL — ABNORMAL HIGH (ref 70–99)

## 2018-08-06 MED ORDER — IOHEXOL 300 MG/ML  SOLN
100.0000 mL | Freq: Once | INTRAMUSCULAR | Status: AC | PRN
  Administered 2018-08-06: 100 mL via INTRAVENOUS

## 2018-08-06 MED ORDER — TRAVASOL 10 % IV SOLN
INTRAVENOUS | Status: DC
  Administered 2018-08-06: 18:00:00 via INTRAVENOUS
  Filled 2018-08-06: qty 1254

## 2018-08-06 NOTE — Progress Notes (Signed)
Patient ID: Brandon Landry, male   DOB: [birth year], 53 y.o.   MRN: [id number] It looks like arrangements have been made for the patient to be transported to [facility] tomorrow and from there patient is supposed to fly out from [place] airport to [place] sometime tomorrow afternoon.  I was notified by the social worker regarding the same.  I would be very hesitant to discharge him in his current condition.  He will have to sign out [place] understanding the risk that his travel to [place] could lead in decompensation of his current condition and even death.

## 2018-08-06 NOTE — Progress Notes (Signed)
PT Cancellation Note  Patient Details Name: [name] MRN: [id number] DOB: 08-Nov-1965   Cancelled Treatment:     pt in bed with eyes closed.  Did not respond to name.  Did not respond to light touch ans name.  Saw pt's personal cell phone at foot of bed, when offered to hand to him he briefly opened his eyes, took the phone from my hand and placed it on the bedside table.  Then he turned his head away and shut his eyes.  When asked if he would like to walk he shook his head no.  When asked if he needed to use bathroom he shook his head no.      Rica Koyanagi  PTA Acute  Rehabilitation Services Pager      [phone] Office      [phone]

## 2018-08-06 NOTE — Progress Notes (Signed)
Nutrition Follow-up  RD working remotely.  DOCUMENTATION CODES:   Underweight, Severe malnutrition in context of chronic illness  INTERVENTION:   -TPN management per pharmacy -Follow-up for ability to transition to enteral feedings  NUTRITION DIAGNOSIS:   Severe Malnutrition related to chronic illness(colonic mass) as evidenced by energy intake < 75% for > or equal to 1 month, moderate fat depletion, severe fat depletion, moderate muscle depletion, severe muscle depletion.  Ongoing  GOAL:   Patient will meet greater than or equal to 90% of their needs  Met with TPN  MONITOR:   PO intake, Supplement acceptance, Labs, Weight trends, Skin, I & O's  REASON FOR ASSESSMENT:   Consult New TPN/TNA  ASSESSMENT:   Brandon Landry is a 53 y.o. male with medical history significant of depression, iron deficiency anemia, tobacco abuse, who presents with generalized weakness.  5/18- s/p BSE-advanced to regular diet with thin liquids 5/19- PICC placed, TPN initiated 5/23- decreased to clear liquids due to abdominal pain and fullness; KUB revealed no perforation of bowel obstruction 5/24- advanced to full liquids 5/25- advanced to soft diet 5/30- TPN advanced to new goal rate, secondary to weight loss  Reviewed I/O's: +2 L x 24 hours and +21.2 L since 07/23/18  UOP: 600 ml x 24 hours  Last report of coffee ground emesis was 08/04/18.   Pt continues to refuse food. He remains on TPN- receiving at goal rate of 95 mL/hr, which provides 2365 kcals and 125 grams protein, meeting 100% of estimated kcal and protein needs. Goal rate was adjusted on 08/01/18, secondary to weight loss. Unsure of accuracy of wts, as last recorded wts on 08/04/18 were between 40.3 kg and 43.1 kg.   RD attempted to obtain nutrition history and discuss possible feeding tube placement with pton 07/22/18/20, however, pt was very tangential and appeared very overwhelmed with current health status. Please review noted  dated 07/22/18 for further details.While nutritional support via enteral route would be most ideal for pt(due to functioning GI tract), unsure if pt would agree to a temporary feeding tube (such as cortrak) at this time. Additionally, if pt is to go home or to SNF in the immediate future, a short term feeding tube may complicate discharge disposition, unless permanent enteral access is established (also unsure of likelihood of placing permanent access such as a PEG due to pt's medically fragile state). Due to severityof pt's malnutrition, recommend continue TPN if pt not amenable to short term feeding tube.Noted pt refused NGT for decompression on 08/04/18, so very unlikely that pt will agree to a small bore feeding tube.   CSW assisting in arranging transport to [place] for further medical care and treatments. Awaiting further details regarding transfer from [employer] [facility] [place] agency.   Labs reviewed: Mg, K, and Phos WDL. CBGS: 105-125  Diet Order:   Diet Order            DIET SOFT Room service appropriate? No; Fluid consistency: Thin  Diet effective now              EDUCATION NEEDS:   Education needs have been addressed  Skin:  Skin Assessment: Reviewed RN Assessment  Last BM:  08/05/18  Height:   Ht Readings from Last 1 Encounters:  07/24/18 5' 8.11" (1.73 m)    Weight:   Wt Readings from Last 1 Encounters:  08/04/18 40.3 kg    Ideal Body Weight:  70 kg  BMI:  Body mass index is 13.47 kg/m.  Estimated  Nutritional Needs:   Kcal:  2250-2450  Protein:  115-130 grams  Fluid:  > 2.2 L    Olanda Downie A. Jimmye Norman, RD, LDN, [place] Registered Dietitian II Certified Diabetes Care and Education Specialist Pager: [phone] After hours Pager: [phone]

## 2018-08-06 NOTE — Progress Notes (Signed)
Patient ID: Brandon Landry, male   DOB: Sep 04, 1965, 53 y.o.   MRN: [id number]  [place]  NWG:[id number] DOB: [birth year] DOA: 07/19/2018 PCP: Patient, No Pcp Per   Brief Narrative:  53 year old male with history of depression, iron deficiency anemia, tobacco abuse, recent few hospitalizations for anemia status post transfusion, recently found to have a large fungating mass in the ascending colon with contained perforation and adjacent abscess formation during last hospitalization for with general surgery recommended surgical intervention however patient refused and patient was subsequently discharged on 07/08/2018.  He returned to [facility] on 07/18/2018 with generalized weakness and was found to have hemoglobin of 3.2.  He was transfused packed red cells and transferred to [place] surgery was consulted.  He currently cannot undergo surgery due to severe malnutrition.  Patient apparently refuses to eat American food.  Palliative care was also consulted for goals of care discussion.  Patient was started on TPN.  Patient now wants to go to [place] for all of his medical care.  This is currently being pursued by palliative care team along with social work/care management.  He was also noted to have embolic CVAs on MRI of the brain for which he was also evaluated by neurology.  Assessment & Plan:   Principal Problem:   Symptomatic anemia Active Problems:   Iron deficiency anemia due to chronic blood loss   Protein-calorie malnutrition, severe (HCC)   Lower extremity edema   Tobacco use disorder   Hypokalemia   Giant mass of hepatic flexure of colon - probable perforated colon cancer   Left arm weakness   Left arm swelling   Palliative care encounter   Cerebral embolism with cerebral infarction  Large fungating right colon mass -CT abdomen/pelvis on admission showed large fungating mass in the ascending colon with probable perforation and adjacent abscesses;  also with likely extension to the duodenum with aortic thrombus.  No fever or abdominal pain at the time.  Repeat CT scanning showed fungating mass with ascites.  General surgery has seen the patient and there is no plan for surgery at this time since the patient has refused surgical intervention here and wishes to go to [place], and furthermore he is severely malnourished  -Currently on Zosyn.  Has persistent leukocytosis with intermittent vomiting.  X-ray of the abdomen was nonspecific.  Will get a CAT scan of the abdomen. -Continue TPN.  Continue pain management. -Family trying to arrange for the patient to be transported to [place].  Social worker is working on the same. '[facility] [place]' is involved in the same. -He is at high risk for decompensation during air travel but risks do not outweigh benefits for the patient and family.  Once travel plans are arranged, he will be discharged with PICC line in place. -In the meantime, if condition worsens, patient should consider hospice/comfort measures.  Overall prognosis is very poor.  Currently remains full code.  Leukocytosis -Worsening.  Continue antibiotics.  Will repeat CT of the abdomen.  Coffee-ground emesis -Continue Protonix 40 mg IV every 12 hours.  Patient intermittently refuses Protonix. -Transfuse if hemoglobin is less than 7. -If continues to have hematemesis, might consider GI evaluation.  Severe microcytic anemia -Likely secondary to colon cancer -Hemoglobin 3.2 on admission. -Patient has had 9 units of packed red cells transfusion at [facility] since admission and 2 units at [facility] prior to transfer. -Hemoglobin is 7.8 today.  Monitor.  Bilateral  cerebral and right cerebellar strokes in the setting of descending aortic vegetation/thrombus and large abdominal mass, likely hypercoagulable from advanced cancer -MRI showed numerous punctate foci of acute/early subacute infarctions in the right greater than  left posterior cerebral hemispheres and right cerebellar hemispheres. -CT of the chest showed mural filling defect along the right lateral wall of ascending thoracic aorta concerning for thrombus -CTA aorta showed several areas of mural thickening and intimal irregularity noted in the ascending aorta, strongly favored to represent infectious vegetation/thrombus -2D echo showed EF of 40 to 45% -Neurology evaluated the patient during the hospitalization and has signed off.  Currently not on anticoagulation because of severe anemia and ongoing bleeding.  Neurology had recommended hospice care.  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 -Patient needs to consider hospice/comfort measures.  Currently refusing the same and wants to go to [place] for further treatment  Left arm swelling  -Doppler studies negative for DVT  Severe protein calorie malnutrition/hypoalbuminemia -Continue TPN.  Has poor appetite, refuses 'American food'   DVT prophylaxis: SCDs  code Status: Full Family Communication: None at bedside. Disposition Plan: Plan for patient to be transported to [place].  This is being arranged by [facility] [place].  Apparently, patient will need to be flown out of [place].  Arrangements for patient to be transported to [facility] is being made.  Unless patient has medical representative or family member accompanying him to [place], likely unsafe discharge and would likely need to sign out AMA.  Will try and confirm this with hospital administration.  Consultants: General surgery/palliative care/neurology  Procedures:  Echo IMPRESSIONS    1. The left ventricle has mild-moderately reduced systolic function, with an ejection fraction of 40-45%. The cavity size was normal. Left ventricular diastolic parameters were normal.  2. There is  akinesis of the apical inferolateral, inferior and anterior walls as well as entire apex. There is significant spontaneous echo contrast in the LV cavity consistent with low flow state. Consider limited study with definity to rule out LV  thrombus.  3. The right ventricle has normal systolic function. The cavity was normal. There is no increase in right ventricular wall thickness. Right ventricular systolic pressure could not be assessed.  4. Trivial pericardial effusion is present.  5. The aortic valve is tricuspid. Mild sclerosis of the aortic valve.  Antimicrobials:  Zosyn from 07/18/2018 onwards  Subjective: Patient seen and examined at bedside.  He is awake, very poor historian.  Hardly engages in any conversation/communication.  No overnight fever or vomiting reported by nursing staff.  Objective: Vitals:   08/04/18 1547 08/04/18 2147 08/05/18 2119 08/06/18 0510  BP: (!) 160/83 116/77 120/80 119/81  Pulse: (!) 101 99 96 96  Resp: 14 16 14 16   Temp:  97.8 F (36.6 C) 98.3 F (36.8 C) 97.9 F (36.6 C)  TempSrc:      SpO2: 100% 100% 98% 100%  Weight:      Height:        Intake/Output Summary (Last 24 hours) at 08/06/2018 1257 Last data filed at 08/06/2018 1100 Gross per 24 hour  Intake 2561.92 ml  Output 600 ml  Net 1961.92 ml   Filed Weights   08/03/18 0500 08/04/18 0500 08/04/18 1100  Weight: 43.1 kg 43.1 kg 40.3 kg    Examination:  General exam: Appears chronically ill.  No distress.  Awake; hardly engages in  conversation.  Extremely poor historian. Respiratory system: Bilateral decreased breath sounds at bases, scattered crackles Cardiovascular system: S1 & S2 heard, rate controlled Gastrointestinal system: Abdomen is distended, soft and nontender. Normal bowel sounds heard. Extremities: No cyanosis, edema  Central nervous system: Awake, hardly answers any questions.  No focal neurological deficits. Moving extremities Skin: No rashes, lesions or ulcers Psychiatry:  Flat affect.    Data Reviewed: I have personally reviewed following labs and imaging studies  CBC: Recent Labs  Lab 08/03/18 0400 08/03/18 2109 08/04/18 0512 08/04/18 1346 08/05/18 0439 08/06/18 0500  WBC 13.9*  --  14.5* 14.3* 16.2* 17.0*  NEUTROABS 12.2*  --   --   --   --   --   HGB 7.2* 9.6* 9.6* 8.3* 8.0* 7.8*  HCT 22.5* 29.7* 29.5* 26.1* 25.4* 25.2*  MCV 91.1  --  89.4 90.9 93.0 93.3  PLT 389  --  404* 391 418* 646*   Basic Metabolic Panel: Recent Labs  Lab 08/02/18 0419 08/03/18 0400 08/04/18 0512 08/05/18 0439 08/06/18 0500  NA 142 146* 153* 143 137  K 3.8 3.6 3.4* 3.8 3.5  CL 109 110 111 110 108  CO2 26 28 32 25 21*  GLUCOSE 126* 145* 157* 117* 108*  BUN 18 21* 28* 28* 26*  CREATININE 0.32* 0.36* 0.45* 0.40* 0.44*  CALCIUM 7.9* 8.2* 8.8* 8.6* 8.3*  MG 2.1 2.3 2.4 2.1 1.9  PHOS  --  3.7  --   --  3.9   GFR: Estimated Creatinine Clearance: 60.9 mL/min (A) (by C-G formula based on SCr of 0.44 mg/dL (L)). Liver Function Tests: Recent Labs  Lab 08/03/18 0400 08/06/18 0500  AST 35 21  ALT 50* 42  ALKPHOS 113 128*  BILITOT 0.6 1.7*  PROT 5.6* 5.7*  ALBUMIN 1.7* 1.7*   No results for input(s): LIPASE, AMYLASE in the last 168 hours. No results for input(s): AMMONIA in the last 168 hours. Coagulation Profile: No results for input(s): INR, PROTIME in the last 168 hours. Cardiac Enzymes: No results for input(s): CKTOTAL, CKMB, CKMBINDEX, TROPONINI in the last 168 hours. BNP (last 3 results) No results for input(s): PROBNP in the last 8760 hours. HbA1C: No results for input(s): HGBA1C in the last 72 hours. CBG: Recent Labs  Lab 08/04/18 0627 08/05/18 0754 08/05/18 1140 08/05/18 1704 08/06/18 0833  GLUCAP 139* 117* 125* 105* 123*   Lipid Profile: No results for input(s): CHOL, HDL, LDLCALC, TRIG, CHOLHDL, LDLDIRECT in the last 72 hours. Thyroid Function Tests: No results for input(s): TSH, T4TOTAL, FREET4, T3FREE, THYROIDAB in the last 72  hours. Anemia Panel: No results for input(s): VITAMINB12, FOLATE, FERRITIN, TIBC, IRON, RETICCTPCT in the last 72 hours. Sepsis Labs: No results for input(s): PROCALCITON, LATICACIDVEN in the last 168 hours.  No results found for this or any previous visit (from the past 240 hour(s)).       Radiology Studies: Dg Abd Acute 2+v W 1v Chest  Result Date: 08/05/2018 CLINICAL DATA:  Vomiting.  Reported: Mass EXAM: DG ABDOMEN ACUTE W/ 1V CHEST COMPARISON:  Chest radiograph and chest CT Jul 05, 2018; abdominal radiograph August 04, 2018 FINDINGS: PA chest: Central catheter tip is in the superior vena cava. No pneumothorax. No evident edema or consolidation. Heart size and pulmonary vascularity are normal. No adenopathy. Supine and upright abdomen: Relative opacity over the abdomen suggests underlying ascites. There is a paucity of small bowel gas. There is no bowel dilatation or free should air-fluid levels. No free air evident.  No abnormal calcification. IMPRESSION: Paucity of small bowel gas. Question enteritis or a degree of ileus. Bowel obstruction felt to be less likely. No free air. Suspect a degree of underlying ascites. Lungs clear. Electronically Signed   By: Lowella Grip III M.D.   On: 08/05/2018 18:19        Scheduled Meds:  alum & mag hydroxide-simeth  15 mL Oral Once   Chlorhexidine Gluconate Cloth  6 each Topical Q0600   nicotine  21 mg Transdermal Daily   pantoprazole (PROTONIX) IV  40 mg Intravenous Q12H   Continuous Infusions:  sodium chloride 250 mL (08/05/18 1518)   piperacillin-tazobactam (ZOSYN)  IV 3.375 g (08/06/18 0601)   TPN ADULT (ION) 95 mL/hr at 08/06/18 0601   TPN ADULT (ION)       LOS: 18 days        Aline August, MD [facility] 08/06/2018, 12:57 PM

## 2018-08-06 NOTE — TOC Progression Note (Addendum)
Transition of Care [facility]) - Progression Note    Patient Details  Name: Brandon Landry MRN: [id number] Date of Birth: 02-Jan-1966  Transition of Care [facility]) CM/SW [place], LCSW Phone Number: 08/06/2018, 3:02 PM  Clinical Narrative:    CSW conferred with CSW AD, Nathaniel Man, Aloha Gell, and Readlyn Cox (legal). Patient will need to complete proper AMA paperwork in order to leave the hospital due to complicated medical status. MD aware and in agreement with AMA requirement. Patient has been noted to have capacity to make his own decisions (and often refusing interpretor and care) and he is adamant about returning to [place] for continued medical care. His [facility] APS worker, [name] Peak, has stated that they concur that patient has capacity to make his own decisions and they are signing off on the patient. CSW updated patient's nephew who is aware of plan to sign out AMA and has coordinated the details with [facility] [place], Clide Dales. CSW confirmed that [name] did in fact contact [facility] [employer] Clint Lipps, Director of Operations, [phone]) to arrange the transport and they are providing an EMT and paramedic for the transport. They have the nursing station phone number to coordinate when they arrive. Charge RN, Shinita, aware of plan and will pass off to night shift. Patient asked CSW to contact Tonya to let her know. Tonya expressed appreciation for the call.     Final Transport details:  -Patient to be picked up at the hospital at 2am by [facility] Clint Lipps, Director of Operations, [phone]). They will call when they arrive. They will then transport patient to [place] airport to meet with RN, Billie Lade, and fly to [place] airport in [place].    Patient plane ticket on shadow chart and a copy with patient. PLEASE make sure the ticket goes with the transport company!!          Expected Discharge Plan: [place] Barriers to Discharge: Continued Medical Work up  Expected Discharge Plan and Services Expected Discharge Plan: [place] In-house Referral: Clinical Social Work, Hospice / [place] arrangements for the past 2 months: Apartment                 DME Arranged: N/A DME Agency: NA       HH Arranged: NA HH Agency: NA         Social Determinants of Health (SDOH) Interventions    Readmission Risk Interventions Readmission Risk Prevention Plan 07/07/2018  Transportation Screening Complete

## 2018-08-06 NOTE — TOC Progression Note (Signed)
Transition of Care [facility]) - Progression Note    Patient Details  Name: [name] MRN: [id number] Date of Birth: Oct 17, 1965  Transition of Care [facility]) CM/SW [place], LCSW Phone Number: 08/06/2018, 11:55 AM  Clinical Narrative:    CSW received call from Mr. Choi with [facility]. He emailed CSW the plane ticket for the patient to discharge tomorrow morning, 08/07/18. CSW printed tickets (it included the ticket and copy of the passport for the RN that is meeting the patient at [place] airport). CSW placed info on shadow chart and a copy was provided to the patient as well.   CSW inquired as to who will be meeting the patient at the hospital for transport to [facility], but [name] states no one will be with the patient. He is contacting [facility] to arrange the pickup for tomorrow morning (around 4am). CSW is staffing case with CSW AD and medical advisor for clearance due to worries that no one will be with patient on the drive to [place]. [name] is contacting [facility] to make sure they will agree to transport.   CSW will place a copy of the email sent by Mr. Choi on shadow chart.   Their proposed itinerary is:     6/5: 4am [facility] pick up at [employer] to travel to [place] and travels to [place] airport.   Expected Discharge Plan: [place] Barriers to Discharge: Continued Medical Work up  Expected Discharge Plan and Services Expected Discharge Plan: [place] In-house Referral: Clinical Social Work, Hospice / [place] arrangements for the past 2 months: Apartment                 DME Arranged: N/A DME Agency: NA       HH Arranged: NA HH Agency: NA         Social Determinants of Health (SDOH) Interventions    Readmission Risk Interventions Readmission Risk Prevention Plan 07/07/2018  Transportation Screening Complete

## 2018-08-06 NOTE — Progress Notes (Signed)
PHARMACY - ADULT TOTAL PARENTERAL NUTRITION CONSULT NOTE   Pharmacy Consult for TPN Indication: Severe malnutrition; R-colon mass extending into duodenum   Patient Measurements: Height: 5' 8.11" (173 cm) Weight: 88 lb 13.5 oz (40.3 kg) IBW/kg (Calculated) : 68.65    Assessment: 53 year old male with R-colon mass possibly extending into duodenum, aortic thrombus, and small strokes in brain. He is very malnourished and refuses to eat (only wants [place]). He is ok with TPN and needs improved nutrition if going to have surgery. Palliative care on board.   GI: Pre-albumin <5>>7.3>>14.1. LBM 5/30; daily emesis for the past few days - phenergan PRN on board Endo: CBGs 108-160s with TPN. No SSI Insulin requirements in the past 24 hours: 0 units Lytes: Na fluctuating 143, K 3.5, Mg 1.9; Corr Ca 10.1, cA X pHOS ~ 40 Renal: SCr 0.4 UOP 0.6 mL/kg/hr Pulm: no issues - RA Cards: VSS Hepatobil:ALT/AST/AlkPhos/TG are WNL. Tbili up 0.6 >1.7 Neuro: pain 0-2. Got high dose Thiamine, now with 100mg  daily in TPN. ID: WBC 14 > 17 on Zosyn. Afebrile. PCT 0.10. Heme: Hgb 4 > 7.7, s/p multiple prbc  TPN Access: PICC  TPN start date: 07/21/18 Nutritional Goals (per RD recommendation on 6/2): KCal: 2250 - 2450 / day Protein: 115 - 130 g Fluid: > 2.2 L / day  Goal TPN rate is 95 ml/hr (provides 125 g protein, 64 g lipids, and 360 g dextrose, 2365 kcal meeting 100% of needs)   Current Nutrition: Regular diet 0%, refusing other food TPN   Plan:  -Continue TPN at rate of 95 mL/hr -This TPN provides 125 g protein, 64 g lipids, and 360 g dextrose, 2365 kcal meeting 100% of needs -Electrolytes in TPN: Increase Mg, others to standard. DP:OEUMPNT 1:1.  -Continue MVI, thiamine in TPN -Continue trace elements MWF    Brandon Landry 08/06/2018 8:21 AM

## 2018-08-07 LAB — GLUCOSE, CAPILLARY: Glucose-Capillary: 87 mg/dL (ref 70–99)

## 2018-08-07 NOTE — Discharge Summary (Signed)
Physician Discharge Summary  Brandon Landry ZTI:[id number] DOB: May 19, 1965 DOA: 07/19/2018  PCP: Patient, No Pcp Per  Admit date: 07/19/2018 Discharge date: 08/07/2018  Admitted From: Home Disposition: Signed out [place]  Discharge Condition: Extremely poor CODE STATUS: Full  Brief/Interim Summary: 53 year old male with history of depression, iron deficiency anemia, tobacco abuse, recent few hospitalizations for anemia status post transfusion, recently found to have a large fungating mass in the ascending colon with contained perforation and adjacent abscess formation during last hospitalization for with general surgery recommended surgical intervention however patient refused and patient was subsequently discharged on 07/08/2018.  He returned to [facility] on 07/18/2018 with generalized weakness and was found to have hemoglobin of 3.2.  He was transfused packed red cells and transferred to [place] surgery was consulted.  He currently cannot undergo surgery due to severe malnutrition.  Patient apparently refuses to eat American food.  Palliative care was also consulted for goals of care discussion.  Patient was started on TPN.  Patient now wants to go to [place] for all of his medical care.  This is currently being pursued by palliative care team along with social work/care management.  He was also noted to have embolic CVAs on MRI of the brain for which he was also evaluated by neurology.  He had a prolonged hospitalization.  Please refer to the daily progress notes of hospitalist and consultants.  He refused to have surgical intervention in the [place] and persistently wanted to go to [place].  Arrangements were made for the same.  Patient had palliative care discussions with palliative care.  He remains full code.  Patient is not medically stable for discharge but he persistently wanted to go to [place].  After arrangements were made for his travel and flight, he  signed out [place] on the morning of 08/07/2018.   Discharge Diagnoses:  Principal Problem:   Symptomatic anemia Active Problems:   Iron deficiency anemia due to chronic blood loss   Protein-calorie malnutrition, severe (HCC)   Lower extremity edema   Tobacco use disorder   Hypokalemia   Giant mass of hepatic flexure of colon - probable perforated colon cancer   Left arm weakness   Left arm swelling   Palliative care encounter   Cerebral embolism with cerebral infarction  Large fungating right colon mass -CT abdomen/pelvis on admission showed large fungating mass in the ascending colon with probable perforation and adjacent abscesses; also with likely extension to the duodenum with aortic thrombus.  No fever or abdominal pain at the time.  Repeat CT scanning showed fungating mass with ascites.  General surgery has seen the patient and there is no plan for surgery at this time since the patient has refused surgical intervention here and wishes to go to [place], and furthermore he is severely malnourished  -Currently on Zosyn.  Has persistent leukocytosis with intermittent vomiting.  X-ray of the abdomen was nonspecific.    Repeat CT of the abdomen still showed large necrotic mass with small ascites. -Patient was treated with TPN. - He refused to have surgical intervention in the [place] and persistently wanted to go to [place].  Arrangements were made for the same.  Patient had palliative care discussions with palliative care.  He remains full code.  Patient is not medically stable for discharge but he persistently wanted to go to [place].  After arrangements were made for his travel and flight, he signed out [place] on the  morning of 08/07/2018. -Overall prognosis is very poor.  Patient should consider comfort measures/hospice.  Leukocytosis -Worsening.    Patient was on Zosyn  Coffee-ground emesis -Was being treated with Protonix intravenously.  Patient  intermittently was refusing Protonix.  Severe microcytic anemia -Likely secondary to colon cancer -Hemoglobin 3.2 on admission. -Patient has had 9 units of packed red cells transfusion at [facility] since admission and 2 units at [facility] prior to transfer.  Bilateral cerebral and right cerebellar strokes in the setting of descending aortic vegetation/thrombus and large abdominal mass, likely hypercoagulable from advanced cancer -MRI showed numerous punctate foci of acute/early subacute infarctions in the right greater than left posterior cerebral hemispheres and right cerebellar hemispheres. -CT of the chest showed mural filling defect along the right lateral wall of ascending thoracic aorta concerning for thrombus -CTA aorta showed several areas of mural thickening and intimal irregularity noted in the ascending aorta, strongly favored to represent infectious vegetation/thrombus -2D echo showed EF of 40 to 45% -Neurology evaluated the patient during the hospitalization and has signed off.  Currently not on anticoagulation because of severe anemia and ongoing bleeding.  Neurology had recommended hospice care.  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 -Patient needs to consider hospice/comfort measures.  Left arm swelling  -Doppler studies negative for DVT  Severe protein calorie malnutrition/hypoalbuminemia -Was treated with TPN.  Had poor appetite, refused 'American food'   No Known Allergies  Consultations:  General surgery/palliative care/neurology   Procedures/Studies: Ct Abdomen Pelvis Wo Contrast  Result Date: 07/27/2018 CLINICAL DATA:  Initial evaluation for acute nausea, vomiting. History of colonic mass. EXAM: CT ABDOMEN AND PELVIS WITHOUT CONTRAST TECHNIQUE: Multidetector CT imaging of the abdomen  and pelvis was performed following the standard protocol without IV contrast. COMPARISON:  Recent CT from 07/20/2018 FINDINGS: Lower chest: Small to moderate layering bilateral pleural effusions, right slightly larger than left. Associated mild bibasilar subsegmental atelectasis. Visualized lungs are otherwise clear. Hepatobiliary: 9 mm cystic lesion again noted within left hepatic lobe, indeterminate. Limited noncontrast evaluation of the liver otherwise unremarkable. Gallbladder within normal limits. No biliary dilatation. Pancreas: Pancreas within normal limits. Spleen: Spleen within normal limits. Adrenals/Urinary Tract: Adrenal glands are normal. Kidneys equal in size. Bilateral nonobstructive nephrolithiasis measuring up to 3-4 mm noted. No appreciable radiopaque calculi seen along the course of either renal collecting system. No hydronephrosis or hydroureter. No discrete renal lesions on this noncontrast examination. Bladder moderately distended. Layering hyperdensity within the posterior bladder lumen could reflect proteinaceous material and/or blood products. No stones seen layering within the bladder lumen. Stomach/Bowel: Stomach moderately distended without acute abnormality. No evidence for bowel obstruction. Previously identified large fungating necrotic mass centered at the right hemiabdomen at the level of the ascending colon again seen, measuring approximately 11.5 x 10.7 x 10.8 cm, grossly similar to previous, although better seen on prior study with IV contrast. Note again made of the tumor involving the second and third portions of the duodenum, also better seen on previous. Diffuse wall thickening of the ascending colon noted proximally. Remainder of the colon of normal caliber without acute finding. Vascular/Lymphatic: Decreased density within the cardiac blood pool suggestive of anemia. Mild aorto bi-iliac atherosclerotic disease. No aneurysm. No visible adenopathy on this noncontrast  examination. Reproductive: Prostate normal. Other: Large volume ascites throughout the abdomen and pelvis, relatively similar. No free intraperitoneal air. Musculoskeletal: No acute osseous  finding. No discrete lytic or blastic osseous lesions. Diffuse anasarca noted within the external soft tissues. IMPRESSION: 1. No significant interval change in size and appearance of large necrotic fungating colonic mass centered about the ascending colon, with invasion of the second and third portions of the duodenum. No associated obstruction, pneumoperitoneum, or other interval complication. 2. Large volume ascites with small to moderate bilateral pleural effusions and diffuse anasarca, similar to previous. 3. Bilateral nonobstructive nephrolithiasis. 4. Layering hyperdensity within the posterior bladder lumen, which could reflect she proteinaceous material/sedimentation and/or blood products. Correlation with urinalysis recommended. Finding is similar to previous. Electronically Signed   By: Jeannine Boga M.D.   On: 07/27/2018 00:55   Dg Abd 1 View  Result Date: 08/01/2018 CLINICAL DATA:  Nausea, vomiting, and constipation for 3 days. Right lower quadrant pain. EXAM: ABDOMEN - 1 VIEW COMPARISON:  07/27/2018 CT abdomen FINDINGS: Gas-filled stomach and colon are noted. There is no disproportionate dilatation of bowel. There is no obvious free intraperitoneal gas. Small calculus projects over the lower pole of the left kidney. IMPRESSION: Nonobstructive bowel gas pattern. Left nephrolithiasis. Electronically Signed   By: Marybelle Killings M.D.   On: 08/01/2018 12:09   Ct Chest W Contrast  Result Date: 07/20/2018 CLINICAL DATA:  Colon cancer. EXAM: CT CHEST, ABDOMEN, AND PELVIS WITH CONTRAST TECHNIQUE: Multidetector CT imaging of the chest, abdomen and pelvis was performed following the standard protocol during bolus administration of intravenous contrast. CONTRAST:  158mL OMNIPAQUE IOHEXOL 300 MG/ML  SOLN COMPARISON:   07/06/2018 FINDINGS: CT CHEST FINDINGS Cardiovascular: The heart size is normal. Calcification in the LAD coronary artery. There is a 2.5 cm low-attenuation filling defect arising from the right lateral wall of the ascending thoracic aorta concerning for thrombus. Mediastinum/Nodes: Normal appearance of the thyroid gland. The trachea appears patent and is midline. Normal appearance of the esophagus. No mediastinal or hilar adenopathy. No axillary or supraclavicular adenopathy. Lungs/Pleura: Small bilateral pleural effusions, similar. Moderate changes of centrilobular and paraseptal emphysema. Scar like density identified in the right apex. No suspicious pulmonary nodule or mass identified. Musculoskeletal: No aggressive lytic or sclerotic bone lesions. CT ABDOMEN PELVIS FINDINGS Hepatobiliary: Low-density structure in lateral segment of left lobe of liver is unchanged measuring 9 mm, image 63/3. No new liver abnormality identified. Pancreas: Unremarkable. No pancreatic ductal dilatation or surrounding inflammatory changes. Spleen: Normal in size. Small peripheral wedge-shaped areas of low attenuation concerning for splenic infarcts. Adrenals/Urinary Tract: Normal appearance of the adrenal glands. There are several small right kidney lesions, too small to reliably characterize. No hydronephrosis bilaterally. Urinary bladder is negative. Stomach/Bowel: The stomach is nondistended. The proximal small bowel loops have a normal caliber. Abnormal bowel wall edema involving the terminal ileum is identified, similar to previous exam. Large necrotic mass within the right abdomen centered around the ascending colon is again noted measuring 10.2 by 8.6 by 10.1 cm (volume = 460 cm^3). Previously this measured 9.9 x 9.9 by 9.6 cm (volume = 490 cm^3). As noted previously tumor appears to involve the second and third portions of the duodenum. Distal colon unremarkable. Vascular/Lymphatic: Aortic atherosclerosis without aneurysm.  No abdominopelvic adenopathy. Reproductive: Prostate is unremarkable. Other: Diffuse abdominopelvic ascites is identified. Musculoskeletal: No acute or significant osseous findings. IMPRESSION: 1. Similar size of large fungating mass within the right hemiabdomen centered around the ascending colon. Invasion of the second and third portions of the duodenum noted. 2. Ascites. 3. Small bilateral pleural effusions. Mural filling defect along the right lateral wall of the  ascending thoracic aorta is concerning for thrombus, image 44/6. 4. Small splenic infarcts. Electronically Signed   By: Kerby Moors M.D.   On: 07/20/2018 09:12   Mr Jeri Cos UX Contrast  Result Date: 07/20/2018 CLINICAL DATA:  53 y/o M; left arm weakness. Possible colon cancer. EXAM: MRI HEAD WITHOUT AND WITH CONTRAST TECHNIQUE: Multiplanar, multiecho pulse sequences of the brain and surrounding structures were obtained without and with intravenous contrast. CONTRAST:  5 cc Gadavist. COMPARISON:  None. FINDINGS: Brain: Numerous punctate foci of reduced diffusion are present throughout the right greater than left posterior frontal lobes, bilateral parietal lobes, right posterior temporal lobe, bilateral occipital lobes, and the right cerebellar hemisphere. Foci of reduced diffusion demonstrate T2 FLAIR hyperintensity. No hemorrhage. After administration of intravenous contrast there is no abnormal enhancement. No extra-axial collection, hydrocephalus, mass effect, or herniation. Vascular: Normal flow voids. Skull and upper cervical spine: Normal marrow signal. Sinuses/Orbits: Negative. Other: None. IMPRESSION: 1. Numerous punctate foci of acute/early subacute infarction are present throughout the right greater than left posterior cerebral hemispheres and the right cerebellar hemisphere. Multiple vascular territories suggests embolic etiology. No hemorrhage or mass effect. 2. No enhancing intracranial metastasis identified. These results will be  called to the ordering clinician or representative by the Radiologist Assistant, and communication documented in the PACS or zVision Dashboard. Electronically Signed   By: Kristine Garbe M.D.   On: 07/20/2018 01:18   Ct Abdomen Pelvis W Contrast  Result Date: 08/06/2018 CLINICAL DATA:  53 year old male with abdominal pain, fever and abscess suspected. History of colonic mass. EXAM: CT ABDOMEN AND PELVIS WITH CONTRAST TECHNIQUE: Multidetector CT imaging of the abdomen and pelvis was performed using the standard protocol following bolus administration of intravenous contrast. CONTRAST:  151mL OMNIPAQUE IOHEXOL 300 MG/ML  SOLN COMPARISON:  CT of the abdomen pelvis dated 07/27/2018 FINDINGS: Lower chest: Partially visualized clusters of nodular densities at the lung bases bilaterally most consistent with an infectious process. No focal consolidation. No intra-abdominal free air. There is a small ascites relatively similar to prior CT. Hepatobiliary: There is invasion of the inferior aspect of the right lobe of the liver by the known colonic mass. A 1 cm left hepatic hypodense lesion likely represents a cyst. Additional subcentimeter lesion in the right lobe of the liver segment V/VIII is too small to characterize but may represent a cyst. No intrahepatic biliary ductal dilatation. No calcified gallstone. Pancreas: There is invasion of the uncinate process of the pancreas by the colonic mass. The body and tail of the pancreas appear unremarkable. Spleen: Normal in size without focal abnormality. Adrenals/Urinary Tract: The adrenal glands are unremarkable. There is no hydronephrosis on either side. Subcentimeter right renal hypodense lesions are too small to characterize. There is symmetric enhancement and excretion of contrast by both kidneys. There is a punctate stone along the right posterior wall of the bladder. The urinary bladder is otherwise unremarkable. Stomach/Bowel: There is a large, centrally  necrotic mass with thick periphery centered in the region of the hepatic flexure of the colon measuring 9 x 13 cm in greatest axial dimensions which appears slightly larger compared to the prior CT. This mass invades into the second portion of the duodenum and duodenal C-loop. There is a communication lumen of the proximal duodenum and necrotic content of this mass. This mass invades the inferior portion of the right lobe of the liver, the uncinate process of the pancreas, as well as anterior and lateral abdominal wall. There is edema of the distal  ileum and proximal colon which may be reactive. Enterocolitis is not excluded. Clinical correlation is recommended. No evidence of bowel obstruction. The appendix is unremarkable as visualized. Vascular/Lymphatic: The abdominal aorta is unremarkable. There is mass effect and compression of the IVC by the colonic mass. The SMV, splenic vein, and main portal vein are patent. No retroperitoneal adenopathy. There is diffuse mesenteric edema. Reproductive: The prostate and seminal vesicles are grossly unremarkable. Other: Diffuse subcutaneous edema and anasarca. Cachexia. Musculoskeletal: No acute or significant osseous findings. IMPRESSION: 1. Large necrotic mass centered in the region of the hepatic flexure of the colon with invasion of the inferior aspect of the right lobe of the liver, uncinate process of the pancreas, duodenum, and anterior and lateral abdominal wall. This mass is slightly larger compared to the prior CT. There is communication of the lumen of the proximal duodenum and necrotic content of the mass. 2. No bowel obstruction. 3. Small ascites similar to prior CT. 4. Partially visualized clusters of nodular densities at the lung bases most consistent with an infectious process. Clinical correlation is recommended. Electronically Signed   By: Anner Crete M.D.   On: 08/06/2018 19:56   Ct Abdomen Pelvis W Contrast  Result Date: 07/20/2018 CLINICAL DATA:   Colon cancer. EXAM: CT CHEST, ABDOMEN, AND PELVIS WITH CONTRAST TECHNIQUE: Multidetector CT imaging of the chest, abdomen and pelvis was performed following the standard protocol during bolus administration of intravenous contrast. CONTRAST:  135mL OMNIPAQUE IOHEXOL 300 MG/ML  SOLN COMPARISON:  07/06/2018 FINDINGS: CT CHEST FINDINGS Cardiovascular: The heart size is normal. Calcification in the LAD coronary artery. There is a 2.5 cm low-attenuation filling defect arising from the right lateral wall of the ascending thoracic aorta concerning for thrombus. Mediastinum/Nodes: Normal appearance of the thyroid gland. The trachea appears patent and is midline. Normal appearance of the esophagus. No mediastinal or hilar adenopathy. No axillary or supraclavicular adenopathy. Lungs/Pleura: Small bilateral pleural effusions, similar. Moderate changes of centrilobular and paraseptal emphysema. Scar like density identified in the right apex. No suspicious pulmonary nodule or mass identified. Musculoskeletal: No aggressive lytic or sclerotic bone lesions. CT ABDOMEN PELVIS FINDINGS Hepatobiliary: Low-density structure in lateral segment of left lobe of liver is unchanged measuring 9 mm, image 63/3. No new liver abnormality identified. Pancreas: Unremarkable. No pancreatic ductal dilatation or surrounding inflammatory changes. Spleen: Normal in size. Small peripheral wedge-shaped areas of low attenuation concerning for splenic infarcts. Adrenals/Urinary Tract: Normal appearance of the adrenal glands. There are several small right kidney lesions, too small to reliably characterize. No hydronephrosis bilaterally. Urinary bladder is negative. Stomach/Bowel: The stomach is nondistended. The proximal small bowel loops have a normal caliber. Abnormal bowel wall edema involving the terminal ileum is identified, similar to previous exam. Large necrotic mass within the right abdomen centered around the ascending colon is again noted  measuring 10.2 by 8.6 by 10.1 cm (volume = 460 cm^3). Previously this measured 9.9 x 9.9 by 9.6 cm (volume = 490 cm^3). As noted previously tumor appears to involve the second and third portions of the duodenum. Distal colon unremarkable. Vascular/Lymphatic: Aortic atherosclerosis without aneurysm. No abdominopelvic adenopathy. Reproductive: Prostate is unremarkable. Other: Diffuse abdominopelvic ascites is identified. Musculoskeletal: No acute or significant osseous findings. IMPRESSION: 1. Similar size of large fungating mass within the right hemiabdomen centered around the ascending colon. Invasion of the second and third portions of the duodenum noted. 2. Ascites. 3. Small bilateral pleural effusions. Mural filling defect along the right lateral wall of the ascending thoracic aorta  is concerning for thrombus, image 44/6. 4. Small splenic infarcts. Electronically Signed   By: Kerby Moors M.D.   On: 07/20/2018 09:12   Dg Abd Acute 2+v W 1v Chest  Result Date: 08/05/2018 CLINICAL DATA:  Vomiting.  Reported: Mass EXAM: DG ABDOMEN ACUTE W/ 1V CHEST COMPARISON:  Chest radiograph and chest CT Jul 05, 2018; abdominal radiograph August 04, 2018 FINDINGS: PA chest: Central catheter tip is in the superior vena cava. No pneumothorax. No evident edema or consolidation. Heart size and pulmonary vascularity are normal. No adenopathy. Supine and upright abdomen: Relative opacity over the abdomen suggests underlying ascites. There is a paucity of small bowel gas. There is no bowel dilatation or free should air-fluid levels. No free air evident. No abnormal calcification. IMPRESSION: Paucity of small bowel gas. Question enteritis or a degree of ileus. Bowel obstruction felt to be less likely. No free air. Suspect a degree of underlying ascites. Lungs clear. Electronically Signed   By: Lowella Grip III M.D.   On: 08/05/2018 18:19   Dg Abd Portable 1v  Result Date: 08/04/2018 CLINICAL DATA:  Hematemesis EXAM: PORTABLE  ABDOMEN - 1 VIEW COMPARISON:  08/01/2018 FINDINGS: Scattered large and small bowel gas is noted. No obstructive changes are seen. The calcifications seen over the left kidney are less well appreciated due to the differing film technique. No free air is noted. No bony abnormality is seen. IMPRESSION: No acute abnormality noted. Previously seen left renal calculi are not as well appreciated on today's exam. Electronically Signed   By: Inez Catalina M.D.   On: 08/04/2018 07:46   Dg Abd Portable 1v  Result Date: 07/25/2018 CLINICAL DATA:  53 year old male with history of abdominal pain and malnutrition. EXAM: PORTABLE ABDOMEN - 1 VIEW COMPARISON:  No priors. FINDINGS: Gas and stool are seen scattered throughout the colon extending to the level of the distal rectum. No pathologic distension of small bowel is noted. No gross evidence of pneumoperitoneum. IMPRESSION: 1. Nonobstructive bowel gas pattern. 2. No pneumoperitoneum. Electronically Signed   By: Vinnie Langton M.D.   On: 07/25/2018 09:15   Ct Angio Chest Aorta W/cm &/or Wo/cm  Result Date: 07/21/2018 CLINICAL DATA:  53 year old male with history of embolic strokes. Possible thrombus in the ascending thoracic aorta noted on prior non gated chest CT. Follow-up study. EXAM: CT ANGIOGRAPHY CHEST WITH CONTRAST TECHNIQUE: Multidetector CT imaging of the chest was performed using the standard protocol as well as cardiac gating during bolus administration of intravenous contrast. Multiplanar CT image reconstructions and MIPs were obtained to evaluate the vascular anatomy. CONTRAST:  151mL OMNIPAQUE IOHEXOL 350 MG/ML SOLN COMPARISON:  Chest CT 05/18/20209. FINDINGS: Cardiovascular: Heart size is enlarged with mild left ventricular dilatation. There is also some mild myocardial thinning in the apex of the left ventricle which appears rounded, suggesting sequela of prior distal LAD territory myocardial infarction. There is no significant pericardial fluid,  thickening or pericardial calcification. Aortic atherosclerosis, as well as calcified atherosclerotic plaque in the left anterior descending coronary artery. Several areas of mural thickening and intimal irregularity are noted in the ascending thoracic aorta. The first of these is at and immediately above the level of the sino-tubular junction best appreciated on axial image 100 of series 14, where this focal mural thickening of the posterolateral wall of the ascending thoracic aorta on the right side extends into the lumen over a distance of approximately 11 mm craniocaudally (coronal image 31 of series 18). The second intimal irregularity is best appreciated  on axial image 60 of series 14 also in the proximal ascending thoracic aorta slightly cranial to the previously described lesion, without much extension into the lumen. The final lesion is much larger, best appreciated on axial image 24 of series 14 and coronal image 63 of series 10 where a focal area of mural thickening along the right lateral wall of the ascending thoracic aorta measures up to approximately 10 x 8 mm, and a pedunculated portion of the lesion extends into the lumen cephalad over distance of approximately 3.3 cm toward the proximal aortic arch. Mediastinum/Nodes: No pathologically enlarged mediastinal or hilar lymph nodes. Esophagus is unremarkable in appearance. No axillary lymphadenopathy. Lungs/Pleura: Small bilateral pleural effusions lying dependently with some associated passive subsegmental atelectasis in the lower lobes of the lungs bilaterally. No acute consolidative airspace disease. Diffuse bronchial wall thickening with moderate centrilobular and paraseptal emphysema. No definite suspicious appearing pulmonary nodules or masses are noted. Upper Abdomen: Please refer to dedicated CT the abdomen and pelvis from yesterday for full description of findings beneath the diaphragm. Musculoskeletal: There are no aggressive appearing lytic  or blastic lesions noted in the visualized portions of the skeleton. Review of the MIP images confirms the above findings. IMPRESSION: 1. Three focal areas of mural thickening and intimal irregularity in the ascending thoracic aorta, as detailed above. These are highly unusual. Based on the multiplicity of these lesions, and the patient's history of large colonic mass which shows signs of pending perforation, these are strongly favored to represent infectious vegetations/thrombus. 2. Aortic atherosclerosis, in addition to left anterior descending coronary artery disease. Please note that although the presence of coronary artery calcium documents the presence of coronary artery disease, the severity of this disease and any potential stenosis cannot be assessed on this non-gated CT examination. Assessment for potential risk factor modification, dietary therapy or pharmacologic therapy may be warranted, if clinically indicated. 3. Cardiomegaly with mild left ventricular dilatation. 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 4. Small bilateral pleural effusions lying dependently. 5. Diffuse bronchial wall thickening with moderate centrilobular and paraseptal emphysema; imaging findings suggestive of underlying COPD. Critical Value/emergent results were called by telephone at the time of interpretation on 07/21/2018 at 2:04 pm to [place], who verbally acknowledged these results. Aortic Atherosclerosis (ICD10-I70.0) and Emphysema (ICD10-J43.9). Electronically Signed   By: Vinnie Langton M.D.   On: 07/21/2018 14:05   Vas [place] Upper Extremity Venous Duplex  Result Date: 07/20/2018 UPPER VENOUS STUDY  Indications: Swelling Comparison Study: No prior study on file for comparison Performing Technologist: Sharion Dove RVS  Examination Guidelines: A complete evaluation includes B-mode imaging, spectral Doppler,  color Doppler, and power Doppler as needed of all accessible portions of each vessel. Bilateral testing is considered an integral part of a complete examination. Limited examinations for reoccurring indications may be performed as noted.  Right Findings: +----------+------------+---------+-----------+----------+-------+ RIGHT     CompressiblePhasicitySpontaneousPropertiesSummary +----------+------------+---------+-----------+----------+-------+ Subclavian               Yes       Yes                      +----------+------------+---------+-----------+----------+-------+  Left Findings: +----------+------------+---------+-----------+----------+-------+ LEFT      CompressiblePhasicitySpontaneousPropertiesSummary +----------+------------+---------+-----------+----------+-------+ IJV                      Yes       Yes                      +----------+------------+---------+-----------+----------+-------+  Subclavian    Full       Yes       Yes                      +----------+------------+---------+-----------+----------+-------+ Axillary      Full       Yes       Yes                      +----------+------------+---------+-----------+----------+-------+ Brachial      Full       Yes       Yes                      +----------+------------+---------+-----------+----------+-------+ Radial        Full                                          +----------+------------+---------+-----------+----------+-------+ Ulnar         Full                                          +----------+------------+---------+-----------+----------+-------+ Cephalic    Partial                                 Chronic +----------+------------+---------+-----------+----------+-------+ Basilic       Full                                          +----------+------------+---------+-----------+----------+-------+  Summary:  Right: No evidence of thrombosis in the subclavian.  Left:  No evidence of deep vein thrombosis in the upper extremity. 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 Chronic partial thrombus noted in a small portion of the cephalic vein from [facility] to mid upper arm.  *See table(s) above for measurements and observations.  Diagnosing physician: Harold Barban MD Electronically signed by Harold Barban MD on 07/20/2018 at 9:11:19 AM.    Final    [place] Ekg Site Rite  Result Date: 07/21/2018 If Site Rite image not attached, placement could not be confirmed due to current cardiac rhythm.   Echo IMPRESSIONS   1. The left ventricle has mild-moderately reduced systolic function, with an ejection fraction of 40-45%. The cavity size was normal. Left ventricular diastolic parameters were normal. 2. There is akinesis of the apical inferolateral, inferior and anterior walls as well as entire apex. There is significant spontaneous echo contrast in the LV cavity consistent with low flow state. Consider limited study with definity to rule out LV  thrombus. 3. The right ventricle has normal systolic function. The cavity was normal. There is no increase in right ventricular wall thickness. Right ventricular systolic pressure could not be assessed. 4. Trivial pericardial effusion is present. 5. The aortic valve is tricuspid. Mild sclerosis of the aortic valve.    The results of significant diagnostics from this hospitalization (including imaging, microbiology, ancillary and laboratory) are listed below for reference.     Microbiology: No results found for this or any previous visit (from the past 240 hour(s)).   Labs: BNP (last 3 results) Recent  Labs    07/19/18 0534  BNP 884.1*   Basic Metabolic Panel: Recent Labs  Lab 08/02/18 0419 08/03/18 0400 08/04/18 0512 08/05/18 0439 08/06/18 0500  NA 142 146* 153* 143 137  K 3.8 3.6 3.4* 3.8 3.5  CL 109 110 111 110 108  CO2 26 28 32 25 21*  GLUCOSE 126* 145*  157* 117* 108*  BUN 18 21* 28* 28* 26*  CREATININE 0.32* 0.36* 0.45* 0.40* 0.44*  CALCIUM 7.9* 8.2* 8.8* 8.6* 8.3*  MG 2.1 2.3 2.4 2.1 1.9  PHOS  --  3.7  --   --  3.9   Liver Function Tests: Recent Labs  Lab 08/03/18 0400 08/06/18 0500  AST 35 21  ALT 50* 42  ALKPHOS 113 128*  BILITOT 0.6 1.7*  PROT 5.6* 5.7*  ALBUMIN 1.7* 1.7*   No results for input(s): LIPASE, AMYLASE in the last 168 hours. No results for input(s): AMMONIA in the last 168 hours. CBC: Recent Labs  Lab 08/03/18 0400 08/03/18 2109 08/04/18 0512 08/04/18 1346 08/05/18 0439 08/06/18 0500  WBC 13.9*  --  14.5* 14.3* 16.2* 17.0*  NEUTROABS 12.2*  --   --   --   --   --   HGB 7.2* 9.6* 9.6* 8.3* 8.0* 7.8*  HCT 22.5* 29.7* 29.5* 26.1* 25.4* 25.2*  MCV 91.1  --  89.4 90.9 93.0 93.3  PLT 389  --  404* 391 418* 509*   Cardiac Enzymes: No results for input(s): CKTOTAL, CKMB, CKMBINDEX, TROPONINI in the last 168 hours. BNP: Invalid input(s): POCBNP CBG: Recent Labs  Lab 08/05/18 0754 08/05/18 1140 08/05/18 1704 08/06/18 0833 08/07/18 0214  GLUCAP 117* 125* 105* 123* 87   D-Dimer No results for input(s): DDIMER in the last 72 hours. Hgb A1c No results for input(s): HGBA1C in the last 72 hours. Lipid Profile No results for input(s): CHOL, HDL, LDLCALC, TRIG, CHOLHDL, LDLDIRECT in the last 72 hours. Thyroid function studies No results for input(s): TSH, T4TOTAL, T3FREE, THYROIDAB in the last 72 hours.  Invalid input(s): FREET3 Anemia work up No results for input(s): VITAMINB12, FOLATE, FERRITIN, TIBC, IRON, RETICCTPCT in the last 72 hours. Urinalysis    Component Value Date/Time   COLORURINE YELLOW 07/27/2018 0956   APPEARANCEUR CLOUDY (A) 07/27/2018 0956   LABSPEC 1.012 07/27/2018 0956   PHURINE 8.0 07/27/2018 0956   GLUCOSEU NEGATIVE 07/27/2018 0956   HGBUR NEGATIVE 07/27/2018 0956   BILIRUBINUR NEGATIVE 07/27/2018 0956   KETONESUR NEGATIVE 07/27/2018 0956   PROTEINUR NEGATIVE 07/27/2018  0956   NITRITE NEGATIVE 07/27/2018 0956   LEUKOCYTESUR NEGATIVE 07/27/2018 0956   Sepsis Labs Invalid input(s): PROCALCITONIN,  WBC,  LACTICIDVEN Microbiology No results found for this or any previous visit (from the past 240 hour(s)).    SIGNED:   Aline August, MD  [facility] 08/07/2018, 10:22 AM

## 2018-08-07 NOTE — Progress Notes (Signed)
PICC line removed per order. Vaseline gauze covered by sterile 2x2 gauze and medipore tape to site. No active bleeding noted @ time of line removal. Dressing to site remains CD&I. Primary care RN and NT instructed patient to remain in bed x 30 minutes post line removal. Verbalized understanding. No questions or concerns voiced, Patient condition stable with NAD noted post line removal.

## 2018-08-07 NOTE — Progress Notes (Signed)
Patient signed out Against Medical Advice. Pt voiced understanding to risks posed by his travel to [place] given pt's condition. Pt's Passport, Itinerary and all belongings given to [employer] transportation. VSS upon discharge. Last BG 89. Pt encouraged  to eat packed snacks enroute  to airport. Dressing from PICC site intact with no bleeding noted. Discharge instructions explained. Verbalized understanding. No questions or concerns voiced.

## 2020-08-09 IMAGING — CT CT ABDOMEN AND PELVIS WITH CONTRAST
2 of 5 series · 12 of 46 positions shown, 14 images · IV contrast (omnipaque)
Comparison: 07/06/2018

CLINICAL DATA: Colon cancer.

EXAM:
CT CHEST, ABDOMEN, AND PELVIS WITH CONTRAST
TECHNIQUE: Multidetector CT imaging of the chest, abdomen and pelvis was
performed following the standard protocol during bolus
administration of intravenous contrast.
CONTRAST:  100mL OMNIPAQUE IOHEXOL 300 MG/ML  SOLN

[Series 3: cap 5.0 i31f 2 · axial · 0.72mm/px · z∈[+817,+1332]mm · 9 of 129 slices shown, 11 images]
[im 13/129  soft-tissue]
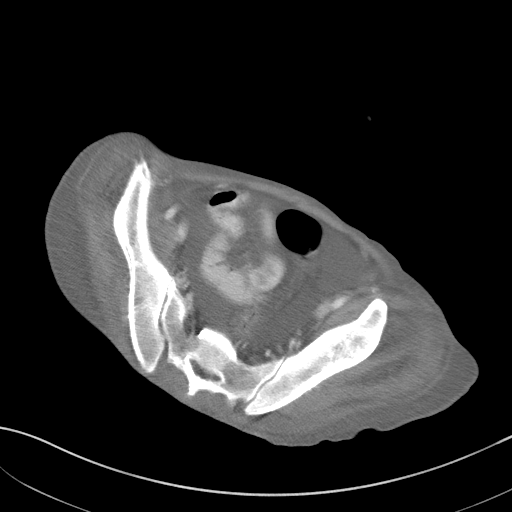
[im 13/129  bone]
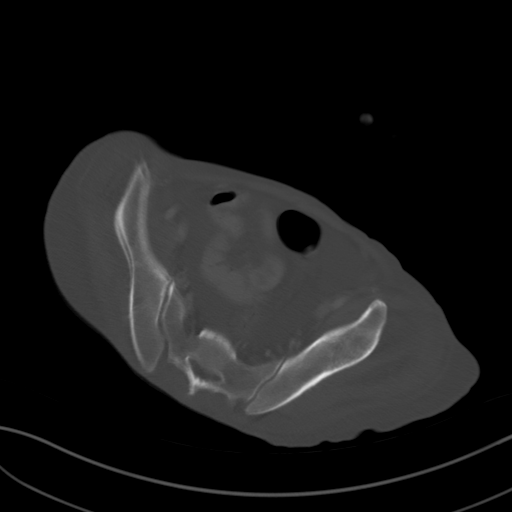
[im 26/129  soft-tissue]
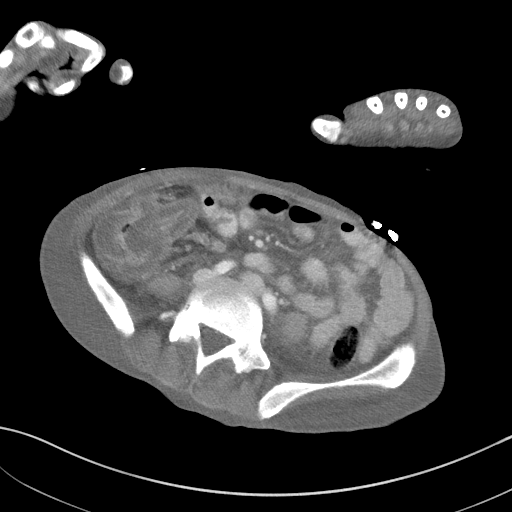
[im 39/129  soft-tissue]
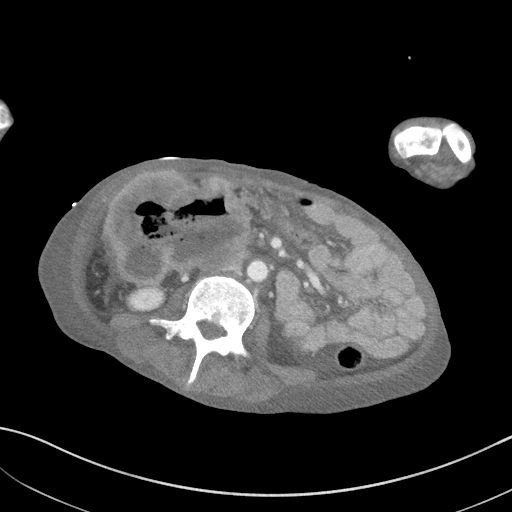
[im 52/129  soft-tissue]
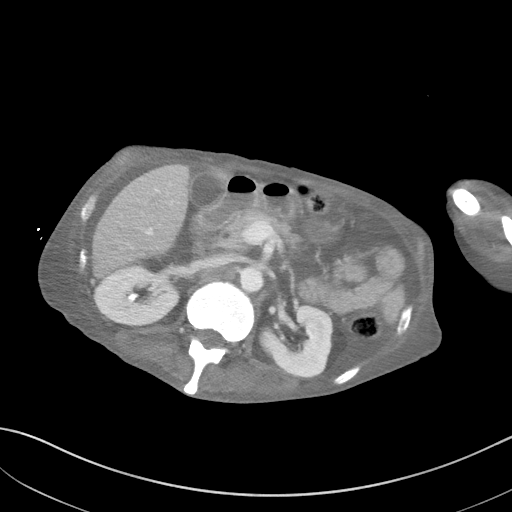
[im 65/129  soft-tissue]
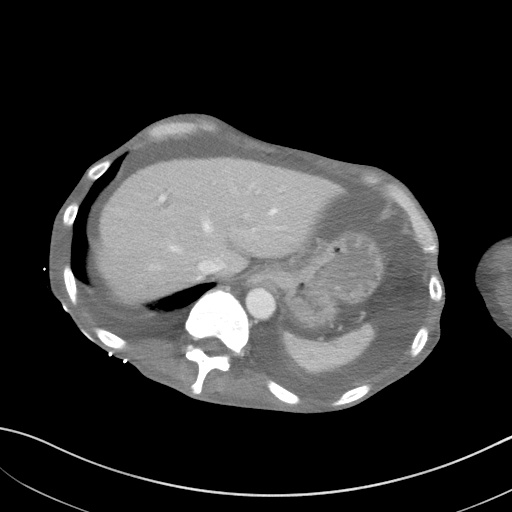
[im 77/129  soft-tissue]
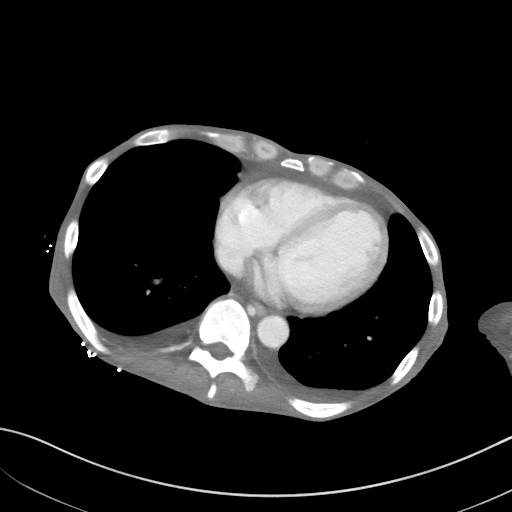
[im 90/129  soft-tissue]
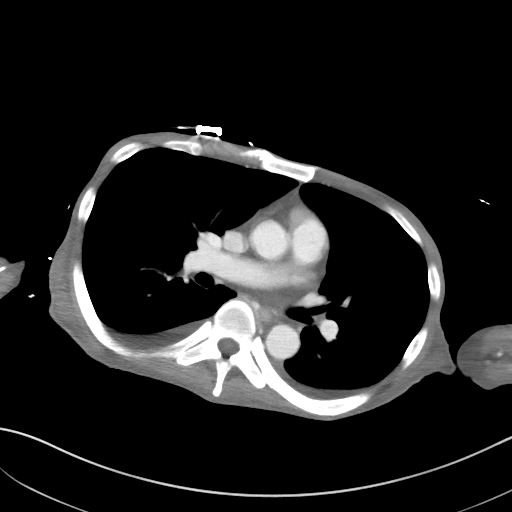
[im 103/129  soft-tissue]
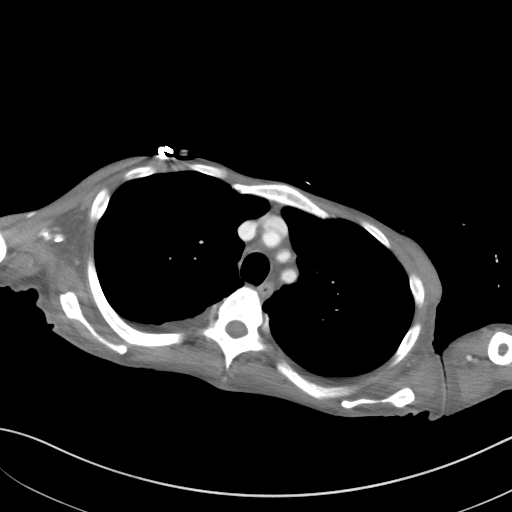
[im 116/129  soft-tissue]
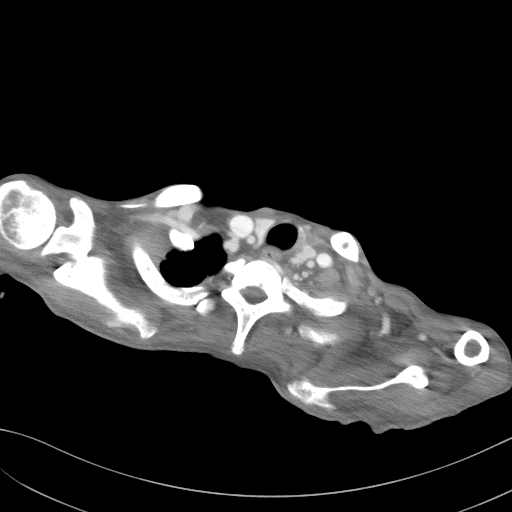
[im 116/129  bone]
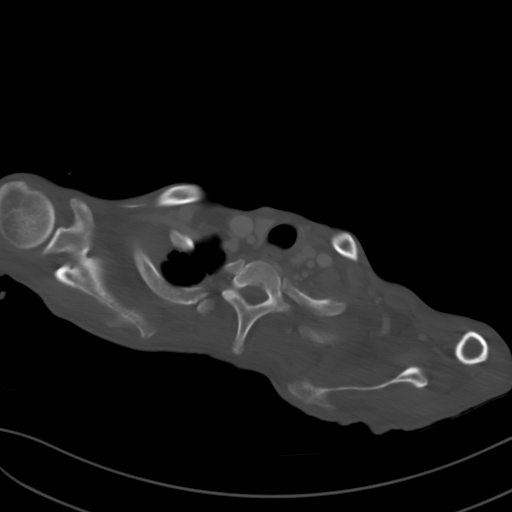

[Series 6: coronal · coronal · 0.64mm/px · 3 of 109 slices shown]
[im 37/109  soft-tissue]
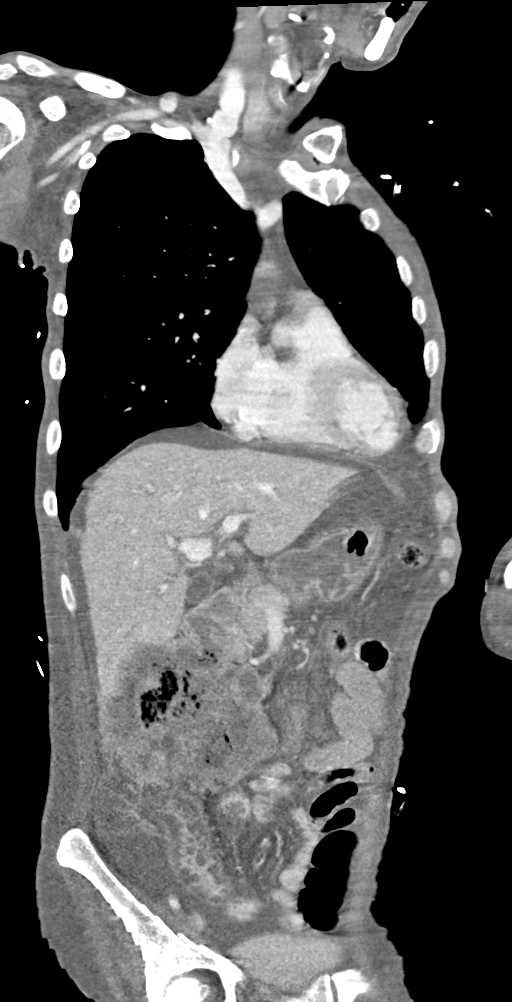
[im 49/109  soft-tissue]
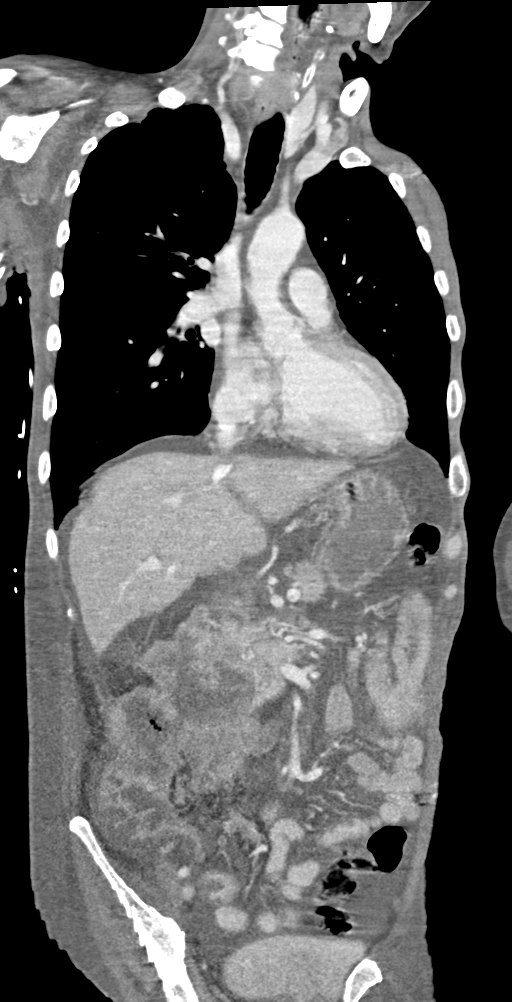
[im 61/109  soft-tissue]
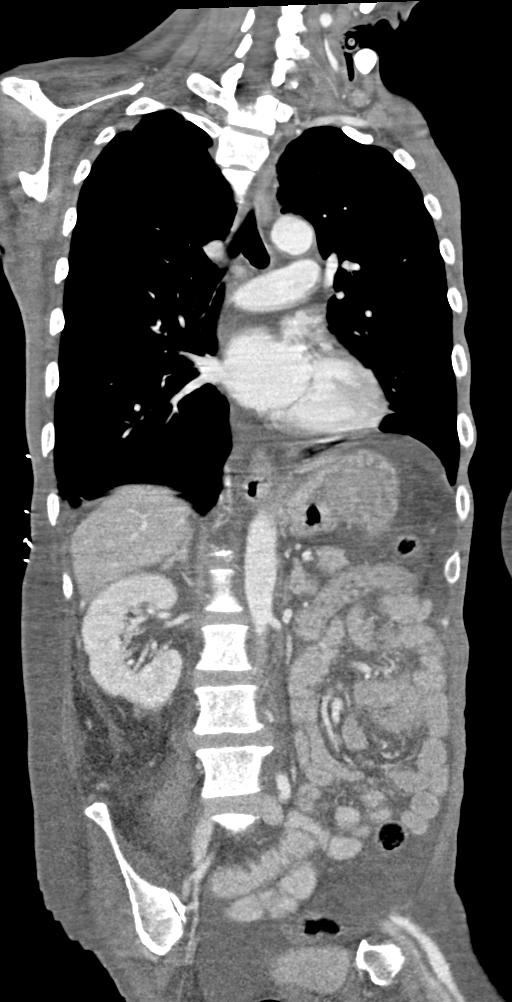

[12 of 46 positions shown; findings below may reference images not displayed]

FINDINGS: CT CHEST FINDINGS

Cardiovascular: The heart size is normal. Calcification in the LAD
coronary artery. There is a 2.5 cm low-attenuation filling defect
arising from the right lateral wall of the ascending thoracic aorta
concerning for thrombus.

Mediastinum/Nodes: Normal appearance of the thyroid gland. The
trachea appears patent and is midline. Normal appearance of the
esophagus. No mediastinal or hilar adenopathy. No axillary or
supraclavicular adenopathy.

Lungs/Pleura: Small bilateral pleural effusions, similar. Moderate
changes of centrilobular and paraseptal emphysema. Scar like density
identified in the right apex. No suspicious pulmonary nodule or mass
identified.

Musculoskeletal: No aggressive lytic or sclerotic bone lesions.

CT ABDOMEN PELVIS FINDINGS

Hepatobiliary: Low-density structure in lateral segment of left lobe
of liver is unchanged measuring 9 mm, image 63/3. No new liver
abnormality identified.

Pancreas: Unremarkable. No pancreatic ductal dilatation or
surrounding inflammatory changes.

Spleen: Normal in size. Small peripheral wedge-shaped areas of low
attenuation concerning for splenic infarcts.

Adrenals/Urinary Tract: Normal appearance of the adrenal glands.
There are several small right kidney lesions, too small to reliably
characterize. No hydronephrosis bilaterally. Urinary bladder is
negative.

Stomach/Bowel: The stomach is nondistended. The proximal small bowel
loops have a normal caliber. Abnormal bowel wall edema involving the
terminal ileum is identified, similar to previous exam. Large
necrotic mass within the right abdomen centered around the ascending
colon is again noted measuring 10.2 by 8.6 by 10.1 cm (volume = 460
cm^3). Previously this measured 9.9 x 9.9 by 9.6 cm (volume = 490
cm^3). As noted previously tumor appears to involve the second and
third portions of the duodenum. Distal colon unremarkable.

Vascular/Lymphatic: Aortic atherosclerosis without aneurysm. No
abdominopelvic adenopathy.

Reproductive: Prostate is unremarkable.

Other: Diffuse abdominopelvic ascites is identified.

Musculoskeletal: No acute or significant osseous findings.
IMPRESSION: 1. Similar size of large fungating mass within the right hemiabdomen
centered around the ascending colon. Invasion of the second and
third portions of the duodenum noted.
2. Ascites.
3. Small bilateral pleural effusions. Mural filling defect along the
right lateral wall of the ascending thoracic aorta is concerning for
thrombus, image 44/6.
4. Small splenic infarcts.
